# Patient Record
Sex: Female | Born: 1935 | Race: White | Hispanic: No | Smoking: Never smoker
Health system: Southern US, Community
[De-identification: ages and names within clinical notes are randomized; demographics above are authoritative.]

## PROBLEM LIST (undated history)

## (undated) ENCOUNTER — Emergency Department: Admission: EM | Payer: Medicare Other | Source: Home / Self Care

## (undated) DIAGNOSIS — E785 Hyperlipidemia, unspecified: Secondary | ICD-10-CM

## (undated) DIAGNOSIS — H3530 Unspecified macular degeneration: Secondary | ICD-10-CM

## (undated) DIAGNOSIS — H9190 Unspecified hearing loss, unspecified ear: Secondary | ICD-10-CM

## (undated) HISTORY — PX: JOINT REPLACEMENT: SHX530

---

## 1974-04-18 HISTORY — PX: DILATION AND CURETTAGE, DIAGNOSTIC / THERAPEUTIC: SUR384

## 2004-01-27 ENCOUNTER — Ambulatory Visit: Payer: Self-pay

## 2004-08-30 ENCOUNTER — Emergency Department: Payer: Self-pay | Admitting: Internal Medicine

## 2004-08-30 ENCOUNTER — Other Ambulatory Visit: Payer: Self-pay

## 2007-12-05 ENCOUNTER — Ambulatory Visit: Payer: Self-pay

## 2012-10-18 ENCOUNTER — Ambulatory Visit: Payer: Self-pay

## 2012-12-07 LAB — COMPREHENSIVE METABOLIC PANEL
Alkaline Phosphatase: 461 U/L — ABNORMAL HIGH (ref 50–136)
Bilirubin,Total: 5.3 mg/dL — ABNORMAL HIGH (ref 0.2–1.0)
Chloride: 96 mmol/L — ABNORMAL LOW (ref 98–107)
Creatinine: 0.83 mg/dL (ref 0.60–1.30)
EGFR (African American): >60
EGFR (Non-African Amer.): >60
Osmolality: 262 (ref 275–301)
Potassium: 3.9 mmol/L (ref 3.5–5.1)
SGOT(AST): 148 U/L — ABNORMAL HIGH (ref 15–37)
Sodium: 130 mmol/L — ABNORMAL LOW (ref 136–145)
Total Protein: 7.2 g/dL (ref 6.4–8.2)

## 2012-12-07 LAB — CBC
HCT: 44.7 % (ref 35.0–47.0)
HGB: 15.2 g/dL (ref 12.0–16.0)
MCH: 30.2 pg (ref 26.0–34.0)
MCHC: 34 g/dL (ref 32.0–36.0)
RBC: 5.02 10*6/uL (ref 3.80–5.20)
RDW: 13.1 % (ref 11.5–14.5)

## 2012-12-07 LAB — CK TOTAL AND CKMB (NOT AT ARMC)
CK, Total: 49 U/L (ref 21–215)
CK-MB: 1.7 ng/mL (ref 0.5–3.6)

## 2012-12-08 ENCOUNTER — Inpatient Hospital Stay: Payer: Self-pay | Admitting: Internal Medicine

## 2012-12-08 LAB — HEPATIC FUNCTION PANEL A (ARMC)
Albumin: 2.7 g/dL — ABNORMAL LOW (ref 3.4–5.0)
Alkaline Phosphatase: 405 U/L — ABNORMAL HIGH (ref 50–136)
Bilirubin, Direct: 2.9 mg/dL — ABNORMAL HIGH (ref 0.00–0.20)
Bilirubin,Total: 3.7 mg/dL — ABNORMAL HIGH (ref 0.2–1.0)
SGOT(AST): 99 U/L — ABNORMAL HIGH (ref 15–37)
Total Protein: 6.6 g/dL (ref 6.4–8.2)

## 2012-12-08 LAB — BILIRUBIN, DIRECT: Bilirubin, Direct: 4.4 mg/dL — ABNORMAL HIGH (ref 0.00–0.20)

## 2012-12-08 LAB — CBC WITH DIFFERENTIAL/PLATELET
Basophil #: 0 10*3/uL (ref 0.0–0.1)
Basophil %: 0.3 %
Eosinophil #: 0.1 10*3/uL (ref 0.0–0.7)
Eosinophil %: 0.5 %
HCT: 40.5 % (ref 35.0–47.0)
Lymphocyte #: 0.6 10*3/uL — ABNORMAL LOW (ref 1.0–3.6)
Lymphocyte %: 4.2 %
MCH: 30.3 pg (ref 26.0–34.0)
MCV: 89 fL (ref 80–100)
Monocyte %: 6.1 %
Neutrophil #: 12.1 10*3/uL — ABNORMAL HIGH (ref 1.4–6.5)
Neutrophil %: 88.9 %
Platelet: 176 10*3/uL (ref 150–440)

## 2012-12-09 LAB — CBC WITH DIFFERENTIAL/PLATELET
Basophil #: 0 10*3/uL (ref 0.0–0.1)
Eosinophil #: 0.2 10*3/uL (ref 0.0–0.7)
Eosinophil %: 2.5 %
HCT: 40.2 % (ref 35.0–47.0)
Lymphocyte #: 1 10*3/uL (ref 1.0–3.6)
Lymphocyte %: 10.6 %
MCH: 30.4 pg (ref 26.0–34.0)
MCV: 90 fL (ref 80–100)
Monocyte #: 0.8 x10 3/mm (ref 0.2–0.9)
Monocyte %: 8.4 %
Neutrophil %: 78.3 %
RBC: 4.49 10*6/uL (ref 3.80–5.20)

## 2012-12-09 LAB — COMPREHENSIVE METABOLIC PANEL
BUN: 11 mg/dL (ref 7–18)
Bilirubin,Total: 3.1 mg/dL — ABNORMAL HIGH (ref 0.2–1.0)
Chloride: 99 mmol/L (ref 98–107)
Co2: 28 mmol/L (ref 21–32)
EGFR (African American): >60
EGFR (Non-African Amer.): 56 — ABNORMAL LOW
Glucose: 111 mg/dL — ABNORMAL HIGH (ref 65–99)
Osmolality: 266 (ref 275–301)
Potassium: 4 mmol/L (ref 3.5–5.1)
SGOT(AST): 87 U/L — ABNORMAL HIGH (ref 15–37)
Sodium: 133 mmol/L — ABNORMAL LOW (ref 136–145)
Total Protein: 5.8 g/dL — ABNORMAL LOW (ref 6.4–8.2)

## 2012-12-09 LAB — LIPID PANEL
Cholesterol: 102 mg/dL (ref 0–200)
Triglycerides: 91 mg/dL (ref 0–200)

## 2012-12-09 LAB — MAGNESIUM: Magnesium: 1.9 mg/dL

## 2012-12-10 DIAGNOSIS — R9431 Abnormal electrocardiogram [ECG] [EKG]: Secondary | ICD-10-CM

## 2012-12-10 LAB — HEPATIC FUNCTION PANEL A (ARMC)
Albumin: 2.5 g/dL — ABNORMAL LOW (ref 3.4–5.0)
Alkaline Phosphatase: 435 U/L — ABNORMAL HIGH (ref 50–136)
Bilirubin, Direct: 1.1 mg/dL — ABNORMAL HIGH (ref 0.00–0.20)
SGOT(AST): 81 U/L — ABNORMAL HIGH (ref 15–37)
Total Protein: 6.1 g/dL — ABNORMAL LOW (ref 6.4–8.2)

## 2012-12-10 LAB — WBC: WBC: 6.2 10*3/uL (ref 3.6–11.0)

## 2012-12-11 LAB — COMPREHENSIVE METABOLIC PANEL
Albumin: 2.2 g/dL — ABNORMAL LOW (ref 3.4–5.0)
Alkaline Phosphatase: 354 U/L — ABNORMAL HIGH (ref 50–136)
Anion Gap: 5 — ABNORMAL LOW (ref 7–16)
BUN: 8 mg/dL (ref 7–18)
Bilirubin,Total: 1.3 mg/dL — ABNORMAL HIGH (ref 0.2–1.0)
Chloride: 105 mmol/L (ref 98–107)
Creatinine: 0.79 mg/dL (ref 0.60–1.30)
Glucose: 124 mg/dL — ABNORMAL HIGH (ref 65–99)
Osmolality: 272 (ref 275–301)
SGPT (ALT): 76 U/L (ref 12–78)
Total Protein: 5.7 g/dL — ABNORMAL LOW (ref 6.4–8.2)

## 2012-12-11 LAB — CBC WITH DIFFERENTIAL/PLATELET
Basophil #: 0 10*3/uL (ref 0.0–0.1)
Basophil %: 0.5 %
Eosinophil #: 0.2 10*3/uL (ref 0.0–0.7)
HCT: 37.6 % (ref 35.0–47.0)
HGB: 13.1 g/dL (ref 12.0–16.0)
Lymphocyte %: 14.4 %
MCHC: 34.9 g/dL (ref 32.0–36.0)
MCV: 88 fL (ref 80–100)
Monocyte #: 0.8 x10 3/mm (ref 0.2–0.9)
Neutrophil #: 5.6 10*3/uL (ref 1.4–6.5)
Platelet: 180 10*3/uL (ref 150–440)
RBC: 4.27 10*6/uL (ref 3.80–5.20)
WBC: 7.8 10*3/uL (ref 3.6–11.0)

## 2012-12-11 LAB — LIPASE, BLOOD: Lipase: 762 U/L — ABNORMAL HIGH (ref 73–393)

## 2012-12-12 LAB — COMPREHENSIVE METABOLIC PANEL
Albumin: 2.5 g/dL — ABNORMAL LOW (ref 3.4–5.0)
Alkaline Phosphatase: 328 U/L — ABNORMAL HIGH (ref 50–136)
Anion Gap: 6 — ABNORMAL LOW (ref 7–16)
BUN: 7 mg/dL (ref 7–18)
Bilirubin,Total: 1.6 mg/dL — ABNORMAL HIGH (ref 0.2–1.0)
Calcium, Total: 8.4 mg/dL — ABNORMAL LOW (ref 8.5–10.1)
Co2: 26 mmol/L (ref 21–32)
Creatinine: 0.7 mg/dL (ref 0.60–1.30)
EGFR (African American): >60
EGFR (Non-African Amer.): >60
Osmolality: 266 (ref 275–301)
Potassium: 3.8 mmol/L (ref 3.5–5.1)
SGOT(AST): 67 U/L — ABNORMAL HIGH (ref 15–37)
SGPT (ALT): 81 U/L — ABNORMAL HIGH (ref 12–78)
Sodium: 133 mmol/L — ABNORMAL LOW (ref 136–145)
Total Protein: 6.3 g/dL — ABNORMAL LOW (ref 6.4–8.2)

## 2012-12-13 LAB — HEPATIC FUNCTION PANEL A (ARMC)
Albumin: 2.5 g/dL — ABNORMAL LOW (ref 3.4–5.0)
Alkaline Phosphatase: 269 U/L — ABNORMAL HIGH (ref 50–136)
Bilirubin, Direct: 0.6 mg/dL — ABNORMAL HIGH (ref 0.00–0.20)
Bilirubin,Total: 1.3 mg/dL — ABNORMAL HIGH (ref 0.2–1.0)
SGPT (ALT): 66 U/L (ref 12–78)
Total Protein: 6.1 g/dL — ABNORMAL LOW (ref 6.4–8.2)

## 2012-12-13 LAB — LIPASE, BLOOD: Lipase: 768 U/L — ABNORMAL HIGH (ref 73–393)

## 2012-12-13 LAB — SODIUM: Sodium: 136 mmol/L (ref 136–145)

## 2012-12-14 LAB — HEPATIC FUNCTION PANEL A (ARMC)
Albumin: 2.8 g/dL — ABNORMAL LOW (ref 3.4–5.0)
Bilirubin, Direct: 0.6 mg/dL — ABNORMAL HIGH (ref 0.00–0.20)
SGPT (ALT): 69 U/L (ref 12–78)
Total Protein: 6.5 g/dL (ref 6.4–8.2)

## 2012-12-14 LAB — LIPASE, BLOOD
Lipase: 1644 U/L — ABNORMAL HIGH (ref 73–393)
Lipase: 1884 U/L — ABNORMAL HIGH (ref 73–393)

## 2012-12-15 LAB — LIPASE, BLOOD: Lipase: 1870 U/L — ABNORMAL HIGH (ref 73–393)

## 2012-12-15 LAB — CREATININE, SERUM: EGFR (African American): >60

## 2012-12-16 LAB — BASIC METABOLIC PANEL
Anion Gap: 10 (ref 7–16)
Calcium, Total: 8.2 mg/dL — ABNORMAL LOW (ref 8.5–10.1)
EGFR (African American): >60
Glucose: 62 mg/dL — ABNORMAL LOW (ref 65–99)
Sodium: 138 mmol/L (ref 136–145)

## 2012-12-16 LAB — PLATELET COUNT: Platelet: 212 10*3/uL (ref 150–440)

## 2012-12-16 LAB — LIPASE, BLOOD: Lipase: 599 U/L — ABNORMAL HIGH (ref 73–393)

## 2012-12-16 LAB — HEMOGLOBIN: HGB: 12.4 g/dL (ref 12.0–16.0)

## 2012-12-17 HISTORY — PX: CHOLECYSTECTOMY: SHX55

## 2012-12-17 LAB — LIPASE, BLOOD: Lipase: 552 U/L — ABNORMAL HIGH (ref 73–393)

## 2012-12-18 LAB — HEPATIC FUNCTION PANEL A (ARMC)
Bilirubin, Direct: 0.3 mg/dL — ABNORMAL HIGH (ref 0.00–0.20)
Bilirubin,Total: 0.8 mg/dL (ref 0.2–1.0)
SGOT(AST): 38 U/L — ABNORMAL HIGH (ref 15–37)
SGPT (ALT): 40 U/L (ref 12–78)

## 2012-12-19 LAB — HEPATIC FUNCTION PANEL A (ARMC)
Albumin: 2.3 g/dL — ABNORMAL LOW (ref 3.4–5.0)
Alkaline Phosphatase: 133 U/L (ref 50–136)
Bilirubin, Direct: 0.3 mg/dL — ABNORMAL HIGH (ref 0.00–0.20)
Bilirubin,Total: 0.7 mg/dL (ref 0.2–1.0)
SGPT (ALT): 32 U/L (ref 12–78)
Total Protein: 5.4 g/dL — ABNORMAL LOW (ref 6.4–8.2)

## 2012-12-20 LAB — COMPREHENSIVE METABOLIC PANEL
Albumin: 2.2 g/dL — ABNORMAL LOW (ref 3.4–5.0)
Anion Gap: 2 — ABNORMAL LOW (ref 7–16)
BUN: 6 mg/dL — ABNORMAL LOW (ref 7–18)
Chloride: 107 mmol/L (ref 98–107)
Co2: 31 mmol/L (ref 21–32)
Creatinine: 0.77 mg/dL (ref 0.60–1.30)
EGFR (African American): >60
EGFR (Non-African Amer.): >60
Glucose: 111 mg/dL — ABNORMAL HIGH (ref 65–99)
Osmolality: 278 (ref 275–301)
SGOT(AST): 24 U/L (ref 15–37)
Sodium: 140 mmol/L (ref 136–145)
Total Protein: 5.3 g/dL — ABNORMAL LOW (ref 6.4–8.2)

## 2012-12-20 LAB — LIPASE, BLOOD: Lipase: 1038 U/L — ABNORMAL HIGH (ref 73–393)

## 2012-12-21 LAB — CBC WITH DIFFERENTIAL/PLATELET
Basophil #: 0.1 10*3/uL (ref 0.0–0.1)
Eosinophil %: 3 %
HGB: 12.9 g/dL (ref 12.0–16.0)
MCH: 30.8 pg (ref 26.0–34.0)
MCHC: 34.6 g/dL (ref 32.0–36.0)
Monocyte #: 0.6 x10 3/mm (ref 0.2–0.9)
Platelet: 226 10*3/uL (ref 150–440)
RDW: 13.8 % (ref 11.5–14.5)
WBC: 6 10*3/uL (ref 3.6–11.0)

## 2012-12-21 LAB — LIPASE, BLOOD: Lipase: 654 U/L — ABNORMAL HIGH (ref 73–393)

## 2012-12-22 LAB — CBC WITH DIFFERENTIAL/PLATELET
Eosinophil #: 0 10*3/uL (ref 0.0–0.7)
Eosinophil %: 0 %
HCT: 34.5 % — ABNORMAL LOW (ref 35.0–47.0)
HGB: 11.6 g/dL — ABNORMAL LOW (ref 12.0–16.0)
Lymphocyte #: 0.3 10*3/uL — ABNORMAL LOW (ref 1.0–3.6)
Lymphocyte %: 1.2 %
MCH: 30.3 pg (ref 26.0–34.0)
Monocyte #: 1 x10 3/mm — ABNORMAL HIGH (ref 0.2–0.9)
Monocyte %: 4.1 %
Platelet: 242 10*3/uL (ref 150–440)
RBC: 3.82 10*6/uL (ref 3.80–5.20)
RDW: 13.5 % (ref 11.5–14.5)
WBC: 25.6 10*3/uL — ABNORMAL HIGH (ref 3.6–11.0)

## 2012-12-22 LAB — COMPREHENSIVE METABOLIC PANEL
Alkaline Phosphatase: 96 U/L (ref 50–136)
Anion Gap: 7 (ref 7–16)
BUN: 6 mg/dL — ABNORMAL LOW (ref 7–18)
Bilirubin,Total: 0.5 mg/dL (ref 0.2–1.0)
Chloride: 103 mmol/L (ref 98–107)
Co2: 26 mmol/L (ref 21–32)
Creatinine: 0.84 mg/dL (ref 0.60–1.30)
EGFR (Non-African Amer.): >60
Potassium: 3.5 mmol/L (ref 3.5–5.1)
SGOT(AST): 46 U/L — ABNORMAL HIGH (ref 15–37)
SGPT (ALT): 29 U/L (ref 12–78)
Sodium: 136 mmol/L (ref 136–145)
Total Protein: 5.1 g/dL — ABNORMAL LOW (ref 6.4–8.2)

## 2012-12-23 LAB — CBC WITH DIFFERENTIAL/PLATELET
Basophil %: 0.3 %
Eosinophil %: 0.2 %
Lymphocyte #: 0.9 10*3/uL — ABNORMAL LOW (ref 1.0–3.6)
Lymphocyte %: 5 %
Neutrophil #: 15.4 10*3/uL — ABNORMAL HIGH (ref 1.4–6.5)
Neutrophil %: 87.3 %
RDW: 14 % (ref 11.5–14.5)
WBC: 17.7 10*3/uL — ABNORMAL HIGH (ref 3.6–11.0)

## 2012-12-23 LAB — COMPREHENSIVE METABOLIC PANEL
Albumin: 2.2 g/dL — ABNORMAL LOW (ref 3.4–5.0)
BUN: 8 mg/dL (ref 7–18)
Calcium, Total: 8.2 mg/dL — ABNORMAL LOW (ref 8.5–10.1)
EGFR (African American): >60
EGFR (Non-African Amer.): >60
Glucose: 114 mg/dL — ABNORMAL HIGH (ref 65–99)
Potassium: 3.9 mmol/L (ref 3.5–5.1)
SGPT (ALT): 24 U/L (ref 12–78)
Sodium: 135 mmol/L — ABNORMAL LOW (ref 136–145)

## 2012-12-24 LAB — CREATININE, SERUM
Creatinine: 0.87 mg/dL (ref 0.60–1.30)
EGFR (African American): >60
EGFR (Non-African Amer.): 58 — ABNORMAL LOW

## 2012-12-24 LAB — CBC WITH DIFFERENTIAL/PLATELET
Basophil #: 0.1 10*3/uL (ref 0.0–0.1)
HCT: 30.5 % — ABNORMAL LOW (ref 35.0–47.0)
Lymphocyte #: 0.9 10*3/uL — ABNORMAL LOW (ref 1.0–3.6)
MCH: 30.8 pg (ref 26.0–34.0)
MCHC: 33.9 g/dL (ref 32.0–36.0)
Monocyte #: 0.8 x10 3/mm (ref 0.2–0.9)
Monocyte %: 7.3 %
Neutrophil %: 81.8 %
RBC: 3.36 10*6/uL — ABNORMAL LOW (ref 3.80–5.20)
WBC: 10.5 10*3/uL (ref 3.6–11.0)

## 2012-12-24 LAB — BASIC METABOLIC PANEL
Anion Gap: 5 — ABNORMAL LOW (ref 7–16)
Chloride: 104 mmol/L (ref 98–107)
Co2: 26 mmol/L (ref 21–32)
Glucose: 85 mg/dL (ref 65–99)
Osmolality: 269 (ref 275–301)
Potassium: 4.6 mmol/L (ref 3.5–5.1)
Sodium: 135 mmol/L — ABNORMAL LOW (ref 136–145)

## 2012-12-25 LAB — PATHOLOGY REPORT

## 2012-12-26 LAB — CBC WITH DIFFERENTIAL/PLATELET
Basophil #: 0.1 10*3/uL (ref 0.0–0.1)
Basophil %: 0.6 %
Eosinophil %: 2.6 %
HCT: 31.7 % — ABNORMAL LOW (ref 35.0–47.0)
Lymphocyte %: 8.4 %
MCH: 30.5 pg (ref 26.0–34.0)
MCV: 90 fL (ref 80–100)
Monocyte #: 1.1 x10 3/mm — ABNORMAL HIGH (ref 0.2–0.9)
Neutrophil #: 7.8 10*3/uL — ABNORMAL HIGH (ref 1.4–6.5)
Neutrophil %: 77.5 %
RBC: 3.52 10*6/uL — ABNORMAL LOW (ref 3.80–5.20)
RDW: 13.6 % (ref 11.5–14.5)
WBC: 10 10*3/uL (ref 3.6–11.0)

## 2012-12-26 LAB — BASIC METABOLIC PANEL
Anion Gap: 8 (ref 7–16)
Chloride: 99 mmol/L (ref 98–107)
Co2: 28 mmol/L (ref 21–32)
Creatinine: 0.74 mg/dL (ref 0.60–1.30)
EGFR (Non-African Amer.): >60
Osmolality: 267 (ref 275–301)
Sodium: 135 mmol/L — ABNORMAL LOW (ref 136–145)

## 2012-12-26 LAB — HEPATIC FUNCTION PANEL A (ARMC)
SGOT(AST): 45 U/L — ABNORMAL HIGH (ref 15–37)
SGPT (ALT): 18 U/L (ref 12–78)

## 2012-12-27 LAB — HEPATIC FUNCTION PANEL A (ARMC)
Albumin: 1.8 g/dL — ABNORMAL LOW (ref 3.4–5.0)
Bilirubin,Total: 0.6 mg/dL (ref 0.2–1.0)
SGOT(AST): 34 U/L (ref 15–37)
SGPT (ALT): 16 U/L (ref 12–78)
Total Protein: 5 g/dL — ABNORMAL LOW (ref 6.4–8.2)

## 2012-12-27 LAB — CBC WITH DIFFERENTIAL/PLATELET
Basophil #: 0.1 10*3/uL (ref 0.0–0.1)
Basophil %: 0.9 %
Eosinophil #: 0.3 10*3/uL (ref 0.0–0.7)
Eosinophil %: 4.5 %
HGB: 10.2 g/dL — ABNORMAL LOW (ref 12.0–16.0)
Lymphocyte #: 0.6 10*3/uL — ABNORMAL LOW (ref 1.0–3.6)
Lymphocyte %: 9.6 %
MCH: 30.8 pg (ref 26.0–34.0)
MCHC: 34.3 g/dL (ref 32.0–36.0)
MCV: 90 fL (ref 80–100)
Monocyte #: 0.9 x10 3/mm (ref 0.2–0.9)
Neutrophil #: 4.8 10*3/uL (ref 1.4–6.5)
Neutrophil %: 71.6 %
Platelet: 227 10*3/uL (ref 150–440)
WBC: 6.7 10*3/uL (ref 3.6–11.0)

## 2012-12-27 LAB — BASIC METABOLIC PANEL
Anion Gap: 8 (ref 7–16)
Co2: 29 mmol/L (ref 21–32)
Creatinine: 0.67 mg/dL (ref 0.60–1.30)
EGFR (African American): >60
EGFR (Non-African Amer.): >60
Sodium: 137 mmol/L (ref 136–145)

## 2012-12-29 ENCOUNTER — Encounter: Payer: Self-pay | Admitting: Internal Medicine

## 2013-01-16 ENCOUNTER — Encounter: Payer: Self-pay | Admitting: Internal Medicine

## 2014-08-08 NOTE — Consult Note (Signed)
Chief Complaint:  Subjective/Chief Complaint seen for choledocholithiasis/jaundice.  still with ruq pain, less than yesterday, positive nausea, no emesis. afebrile   VITAL SIGNS/ANCILLARY NOTES: **Vital Signs.:   24-Aug-14 14:01  Vital Signs Type Routine  Temperature Temperature (F) 98.4  Celsius 36.8  Temperature Source oral  Pulse Pulse 81  Respirations Respirations 20  Systolic BP Systolic BP 138  Diastolic BP (mmHg) Diastolic BP (mmHg) 68  Mean BP 91  Pulse Ox % Pulse Ox % 94  Pulse Ox Activity Level  At rest  Oxygen Delivery Room Air/ 21 %   Brief Assessment:  Cardiac Regular   Respiratory clear BS   Gastrointestinal details normal Soft  Nondistended  Bowel sounds normal  No rebound tenderness  tender to palpation in the epigastrum and right upper quadrant.   Lab Results: Hepatic:  22-Aug-14 22:11   Bilirubin, Total  5.3  Alkaline Phosphatase  461  SGPT (ALT)  183  SGOT (AST)  148  23-Aug-14 17:13   Bilirubin, Total  3.7  Alkaline Phosphatase  405  SGPT (ALT)  135  SGOT (AST)  99  Bilirubin, Direct  2.90 (Result(s) reported on 08 Dec 2012 at 06:06PM.)    22:11   Bilirubin, Direct  4.4 (Result(s) reported on 08 Dec 2012 at 10:38AM.)  24-Aug-14 04:29   Bilirubin, Total  3.1  Alkaline Phosphatase  399  SGPT (ALT)  112  SGOT (AST)  87  Total Protein, Serum  5.8  Albumin, Serum  2.5  Routine Chem:  24-Aug-14 04:29   Glucose, Serum  111  BUN 11  Creatinine (comp) 0.89  Sodium, Serum  133  Chloride, Serum 99  CO2, Serum 28  Calcium (Total), Serum  8.2  Osmolality (calc) 266  eGFR (African American) >60  eGFR (Non-African American)  56 (eGFR values <60mL/min/1.73 m2 may be an indication of chronic kidney disease (CKD). Calculated eGFR is useful in patients with stable renal function. The eGFR calculation will not be reliable in acutely ill patients when serum creatinine is changing rapidly. It is not useful in  patients on dialysis. The eGFR calculation  may not be applicable to patients at the low and high extremes of body sizes, pregnant women, and vegetarians.)  Anion Gap  6  Magnesium, Serum 1.9 (1.8-2.4 THERAPEUTIC RANGE: 4-7 mg/dL TOXIC: > 10 mg/dL  -----------------------)  Cholesterol, Serum 102  Triglycerides, Serum 91  HDL (INHOUSE)  7  VLDL Cholesterol Calculated 18  LDL Cholesterol Calculated 77 (Result(s) reported on 09 Dec 2012 at 05:30AM.)  Routine Hem:  22-Aug-14 22:11   WBC (CBC)  18.2  23-Aug-14 17:13   WBC (CBC)  13.6  24-Aug-14 04:29   WBC (CBC) 9.0  RBC (CBC) 4.49  Hemoglobin (CBC) 13.7  Hematocrit (CBC) 40.2  Platelet Count (CBC) 170  MCV 90  MCH 30.4  MCHC 34.0  RDW 13.4  Neutrophil % 78.3  Lymphocyte % 10.6  Monocyte % 8.4  Eosinophil % 2.5  Basophil % 0.2  Neutrophil #  7.0  Lymphocyte # 1.0  Monocyte # 0.8  Eosinophil # 0.2  Basophil # 0.0 (Result(s) reported on 09 Dec 2012 at 05:08AM.)   Radiology Results: US:    23-Aug-14 02:50, US Abdomen Limited Survey  US Abdomen Limited Survey   REASON FOR EXAM:    RUQ pain  COMMENTS:   Body Site: GB and Fossa, CBD, Head of Pancreas    PROCEDURE: US  - US ABDOMEN LIMITED SURVEY  - Dec 08 2012  2:50AM       RESULT: The study is very limited due to the patient's physical kyphosis.    The gallbladder exhibits distention with stones. The gallbladder wall is   thickened at 4.6 mm. A small amount of free fluid is noted adjacent to   the gallbladder. A sonographic Murphy's sign could not be assessed due to   the patient's medicated status.    There is dilation of the common bile duct at 8.5 mm and it contains   multiple echogenic foci consistent with stones. The common bile duct   measures as much as 13 mm in the pancreatic head.  The pancreas is poorly evaluated due to bowel gas and the patient's   clinical position due to kyphosis. Evaluation of the liver is quite   limited. Portal venous flow is normal in direction toward the  liver.    IMPRESSION:    1. The gallbladder is mildly distended and contains multiple stones.   There is gallbladder wall thickening and a trace of pericholecystic   fluid. These findings could reflect acute cholecystitis.  2. There are common bile duct stones and there is dilation of the common   bile duct between 8.5 and 13 mm.  3. Evaluation the pancreas and liver was limited.    Followup CT scanning may be of value.    A preliminary report was sent to the emergency department at the     conclusion of the study.     Dictation Site: 1        Verified By: DAVID A. Martinique, M.D., MD   Assessment/Plan:  Assessment/Plan:  Assessment 1) cholelithiasis, cholecystitis, choledocholithiasis-wbc improved, less pain on abx.   Plan 1) will arrange for ercp tomorrow pm, recommend ccy thereafter.  will discuss with Dr Candace Cruise tomorrow.   Electronic Signatures: Loistine Simas (MD)  (Signed 24-Aug-14 15:37)  Authored: Chief Complaint, VITAL SIGNS/ANCILLARY NOTES, Brief Assessment, Lab Results, Radiology Results, Assessment/Plan   Last Updated: 24-Aug-14 15:37 by Loistine Simas (MD)

## 2014-08-08 NOTE — Consult Note (Signed)
Brief Consult Note: Diagnosis: cholecystitis, choledocholithiasis, cholelithiasis.   Patient was seen by consultant.   Recommend to proceed with surgery or procedure.   Comments: Patient seen and examined, full consult dictated [phone]#[id number].  Patient 81 yo f admitted with obstructive jaundice in the setting of choledocholithiasis, cyolecystitis, and cholelithiasis.  Currently appears stable and afebrile, but with positive murphies sign.  On iv abx.  Will repeat labs now and again in am.  Low threshold for transfer for ercp if needed sooner than monday, as ercp not available here until monday.  Electronic Signatures: Barnetta ChapelSkulskie, Martin (MD)  (Signed 23-Aug-14 16:59)  Authored: Brief Consult Note   Last Updated: 23-Aug-14 16:59 by Barnetta ChapelSkulskie, Martin (MD)

## 2014-08-08 NOTE — Discharge Summary (Signed)
PATIENT NAME:  Yvonne Edwards, Yvonne Edwards MR#:  [id number] DATE OF BIRTH:  01/06/1919  FINAL DIAGNOSES:  1.  Cholelithiasis.  2.  Choledocholithiasis with obstruction.  3.  Postoperative endoscopic retrograde cholangiopancreatography related pancreatitis.   PRINCIPLE PROCEDURES:  1.  ERCP with stone extraction.  2.  HIDA scan.  3.  Open cholecystectomy with choledochoduodenostomy on December 21, 2012   CONSULTATIONS: With GI medicine, internal medicine, case management, physical therapy.   HOSPITAL COURSE SUMMARY: The patient was admitted to the medical service with abdominal pain. She was noted to have elevated liver function tests and a dilated bile duct on ultrasonography. She was taken for an ERCP. Stone extraction with stent was performed but difficult. Sphincterotomy or stent placement could not be performed.   Postoperatively the patient had post-ERCP pancreatitis and abdominal pain which was mild. Despite being n.p.o. for several days, her abdominal pain continued after eating. CT scan demonstrated extensive stone disease within the gallbladder with possibility of acute cholecystitis, at which point surgical services became re-involved and a HIDA scan was performed, demonstrating nonvisualization of the gallbladder. There was prompt filling of the duodenum.   She was taken to the operating room on September 6, at which point an open cholecystectomy with choledochoduodenostomy was performed with extraction of multiple stones.   Postoperatively she did well. She had minimal pain. This was controlled well with a PCA. Nasogastric tube was left in place a total of two and half days and was removed. There was never any bile within her JP which was removed on postoperative day #6 along with her staples.   Her diet was able to be advanced and she tolerated this well without any nausea or vomiting.   Case management did become involved because the patient does live by herself and she was deemed a [facility]  [facility].   DISPOSITION: Discharge will be performed on September 12 to the rehabilitation facility.   DISCHARGE MEDICATIONS:  1.  Crestor 20 mg by mouth daily.  2.  Zofran ODT 4 mg by mouth as needed 3 times a day as needed for nausea.  3.  Omeprazole 10 mg by mouth delayed release 1 tablet  daily.  4.  Colace 100 mg by mouth b.i.d.  5.  Tramadol 50 mg by mouth every 6 hours as needed 1 tab.   DISCHARGE INSTRUCTIONS: Call with any questions or concerns. [facility]    ____________________________ Redge GainerMark A. Egbert GaribaldiBird, MD mab:np D: 12/28/2012 11:53:31 ET T: 12/28/2012 17:55:46 ET JOB#: [id number]  cc: Loraine LericheMark A. Egbert GaribaldiBird, MD, <Dictator> Marcine Matarharles W. Phillips Jr., MD Ryleigh Buenger Kela MillinA Taiven Greenley MD ELECTRONICALLY SIGNED 12/29/2012 14:15

## 2014-08-08 NOTE — Op Note (Signed)
PATIENT NAME:  Yvonne Edwards, Yvonne Edwards MR#:  [id number] DATE OF BIRTH:  [birth year]  DATE OF PROCEDURE:  12/21/2012  PREOPERATIVE DIAGNOSIS: 1.  Cholelithiasis. 2.  Choledocholithiasis. 3.  Biliary pancreatitis.   POSTOPERATIVE DIAGNOSIS: 1.  Cholelithiasis. 2.  Choledocholithiasis. 3.  Biliary pancreatitis.   PROCEDURES PERFORMED:  1.  Attempted laparoscopic cholecystectomy with conversion to open cholecystectomy. 2.  Open common bile duct exploration. 3.  Choledochoduodenostomy.   SURGEON: Yvonne Edwards, M.D.   ASSISTANT: Dr. Duwaine Maxin.  TYPE OF ANESTHESIA: General endotracheal.   INDICATIONS: This 81 year old white female was admitted on the 23rd of August with 3 days of right upper quadrant abdominal pain and obvious jaundice. An ERCP was subsequently performed on 12/10/2012, at which point 1 stone was extracted, however, a sphincterotomy could not be performed due to duodenal anatomy and duodenal diverticulum. The patient subsequently had post ERCP pancreatitis. Her lipase really never normalized. She continued to have intermittent abdominal pain over the course of the next several days. Her lipase, having never normalized, a CT scan was obtained which demonstrated findings suggestive of possible acute cholecystitis with no evidence of peripancreatic edema. There was no evidence of biliary ductal dilatation seen on the CT scan. Subsequently, a HIDA scan was performed. the morning of surgery which demonstrated nonvisualization of the gallbladder but prompt emptying of tracer into the duodenum. Given her previous ERCP findings and the fact that she may have a common bile duct stone, I discussed with her and her family the possibility of needing an open common bile duct exploration and possible T-tube placement. All of her questions were answered.   FINDINGS: During the laparoscopic portion of the operation a 2 mm cautery injury was noted on the anterior aspect of the midportion of the common  bile duct. Because of this, I converted to an open procedure. I also contacted Dr. Anda Edwards for his expert assistance.   DESCRIPTION OF PROCEDURE: With informed consent, supine position, general oral endotracheal anesthesia, the patient's left arm was tucked and padded at her side. Her abdomen was widely prepped and draped with ChloraPrep solution and a time out was observed. A 12 mm Hassan trocar was placed through an open technique through an infraumbilical transverse oriented skin incision with stay sutures being passed through the fascia. Pneumoperitoneum was established. A 5 mm Bladeless trocar was placed in the epigastric region and two 5 mm ports were placed in the right subcostal margin. Mature adhesions of the omentum were taken down off the gallbladder body with traction and hot cautery. Sharp dissection was also utilized. It should be noted that the right lobe of the liver was very superior in this patient resulting in a very anteriorly located gallbladder. The gallbladder was grasped along its fundus and elevated towards the right shoulder. Lateral traction was achieved on Hartman pouch. The portal structures appeared to be very anterior in this patient. Careful dissection along the peritoneal reflection of the gallbladder with hook cautery was achieved medially but was difficult to achieve laterally. During this process, I noticed a small amount of bile staining. Closer inspection demonstrated the above findings and as such, open procedure was then performed.   At this point I contacted Dr. Anda Edwards and discussed operative findings with the family present.  Decision was made to proceed.  Trocars were removed under direct visualization. A right upper quadrant transversely oriented Coker incision was fashioned. The rectus muscle was divided with electrocautery. A self-retaining abdominal wall retractor was placed. The right lobe of the  liver was delivered into the wound by packing posteriorly with 3  wet laparotomy tapes. Nasogastric tube was passed by anesthesia and confirmed by palpation within the antrum of the stomach. The gallbladder was taken down with careful dissection in a dome down fashion. Hemostasis being obtained with direct pressure and cautery in the gallbladder fossa as well as Surgicel.   The cystic duct was tortuous and rather dilated. It had spiraled itself and actually was attached anteriorly to the common bile duct resulting in to joining of the two for some distance.   The cystic artery was identified and doubly clipped on the portal side, singly clipped on the gallbladder side and divided with Metzenbaum scissors. The cystic duct was critically identified at this point. It was ligated with a #0 silk suture and the specimen was amputated. A generous Coker maneuver was then performed. The 2 mm injury to the common bile duct was several millimeters below the junction of the cystic duct. In fact, the bifurcation of the right and left hepatic ducts to the common bile duct was below the hilar plate.   At this point I asked Dr. Anda KraftMarterre for his assistance.  A #11 blade was used to create a longitudinal choledochotomy. This was extended below the area of the injury. The edges of the injury were debrided sharply to fresh bleeding tissue. Stay sutures of 5-0 PDS were placed on either side.   Biliary scoops were then placed through the choledochotomy and approximately 15 to 20 small stones were extracted. I could not pass Blake dilators down into the duodenum through the sphincter of Oddi. The smallest Blake dilator available was a number 4.  The size of the duct with a #11 Blake.   Because of this, a choledochoduodenostomy was chosen for reconstruction. The duodenum was opened along its longitudinal access for a very a short distance with electrocautery. An anastomosis between the choledochus and the duodenum was then fashioned with multiple interrupted 5-0 double-armed PDS suture on RB1  needles.  On the medial aspect we had no further 5-0 PDS RB1 sutures and as such, a 4-0 PDS suture was utilized which was single armed.  All knots were on the outside of the duct. The duct was widely patent and the anastomosis appeared to be intact.  A 19 mm JP drain was then placed exiting laterally and directed into Morison pouch. The right upper quadrant was copiously irrigated. Hemostasis was ensured on the operative field. Laparotomy tapes were accounted for with LAP and needle count correct x 2. The infraumbilical fascial defect was reapproximated with an additional figure-of-eight #0 Vicryl suture in vertical orientation and the previously placed stay sutures in the fascia were tied to each other. A two layer closure utilizing #0 PDS was utilized for fascial closure. Subcutaneous tissues were irrigated. A skin stapler was used to reapproximate skin edges. The patient was subsequently extubated and taken to the recovery room in stable and satisfactory condition by anesthesia service    ____________________________ Redge GainerMark A. Egbert GaribaldiBird, MD mab:dp D: 12/22/2012 08:38:39 ET T: 12/22/2012 09:22:36 ET JOB#: [id number]  cc: Loraine LericheMark A. Egbert GaribaldiBird, MD, <Dictator> Kimra Kantor A Iylah Dworkin MD ELECTRONICALLY SIGNED 12/25/2012 10:01

## 2014-08-08 NOTE — Consult Note (Signed)
PATIENT NAME:  Yvonne Edwards, Yvonne Edwards MR#:  [id number] DATE OF BIRTH:  Feb 04, 1919  DATE OF CONSULTATION:  12/08/2012  CONSULTING PHYSICIAN:  Christena Deem, MD  This is a patient of Dr. Amado Coe.  REASON FOR CONSULTATION: Possible ERCP, choledocholithiasis and cholelithiasis.   HISTORY OF PRESENT ILLNESS: Yvonne Edwards is a very pleasant 81 year old female who states that about 2 days ago she began to have some epigastric and upper right quadrant abdominal pain. She stated that it got much worse yesterday and she came to the hospital earlier this morning. The pain has been epigastric. It seems to radiate more so to the left side. There has been some nausea and vomiting. The last time, however, that she threw up was yesterday. Her pain currently is a 2 out of 10. She was more concerned about a fall she had in July that also gave her some abdominal discomfort. That, however, seemed to have resolved previously. She has had no previous episodes of similar pain to this. She has had some problems with recent constipation due to a pain pill that she was taking for her fall. She usually has a bowel movement about every 1 to 2 days. There has been no black stools, blood in the stools or slimy stools. She has had no symptoms that she came into the hospital with previously.   GASTROINTESTINAL FAMILY HISTORY: Negative for colorectal cancer, liver disease or ulcers. It is possible that her mother had gallbladder problems, but it has been a long time and she could not remember directly.   PAST MEDICAL HISTORY: She has a history of senile macular degeneration (dry variant), hypercholesterolemia, allergies. She has had a D and C in the past. She denies any other hospitalizations or ongoing medical treatment.   OUTPATIENT MEDICATIONS: Include Crestor 10 mg daily. She also takes an allergy pill; I believe this is likely over-the-counter like Claritin or Allegra. She also takes a proprietary eye vitamin.   ALLERGIES: SHE IS  ALLERGIC TO BIAXIN, CODEINE, TYLENOL AND SHELLFISH.   REVIEW OF SYSTEMS: Per admission history and physical, agree with same.   PHYSICAL EXAMINATION: VITAL SIGNS: Temperature is 98.1, pulse 86, respirations 20, blood pressure 102/65, pulse oximetry 94%.  GENERAL: She is a 81 year old Caucasian female in no acute distress. She is mildly jaundiced.  HEENT: Normocephalic, atraumatic. Eyes show some icterus. Nose: Septum midline. Oropharynx: No lesions.  NECK: Supple. No JVD.  HEART: Regular rate and rhythm.  LUNGS: Clear.  ABDOMEN: Soft. She is tender to palpation throughout the epigastric region extending into the left and right upper quadrants. There is a positive mild Murphy's sign to the right upper quadrant. There are no masses, rebound or organomegaly. Bowel sounds are positive.  EXTREMITIES: No clubbing, cyanosis or edema.  NEUROLOGICAL: Cranial nerves II through XII grossly intact. Muscle strength bilaterally equal and symmetric, 5/5. DTRs bilaterally equal and symmetric.   LABORATORY AND RADIOLOGICAL DATA: Include glucose of 112 (these laboratories again from last night), BUN 13, creatinine 0.83, sodium 130, potassium 3.9, chloride 96, bicarbonate 30 calcium 9.0. Hepatic profile showing a total protein of 7.2, albumin 3.3, total bilirubin 5.3. I did order a direct bilirubin; this came back as a 4.4 from those labs. Alkaline phosphatase 461, AST 148, ALT 183. Her CK total was 49; CPK-MB was 1.7, troponin I less than 0.02. Hemogram showed a white count of 18.2, hemoglobin and hematocrit of 15.2 and 44.7, platelet count of 186, MCV of 89. She had a portable chest film showing lungs  less well inflated than on previous and a persistent density in the left lung base reflecting possibly atelectasis, scarring or possibly infiltrate. Followup film was recommended. She did have an abdominal ultrasound today that showed the gallbladder mildly distended with multiple stones. There is a gallbladder wall  thickening, trace of pericholecystic fluid, likely representing acute cholecystitis. There are  common bile duct stones and dilatation of the common bile duct between 8.5 to 13 mm. There was also limited evaluation of the pancreas and liver. There have been no labs repeated since last night.   ASSESSMENT: Choledocholithiasis/cholelithiasis/cholecystitis. The patient currently is afebrile. She has been placed on IV antibiotic. She does have a mild positive Murphy's sign as well as continued epigastric pain. Other symptoms in terms of nausea and vomiting have improved.   RECOMMENDATION: Agree that ERCP is a needed procedure for this patient, then to be followed by cholecystectomy. ERCP is not available until Monday, as physicians that do those procedures will not be available until Monday. The patient is currently on an antibiotic and is afebrile. Will repeat labs to re-evaluate liver lab status as well as hemogram. Should she show any evidence of decline, she will need to have this procedure more urgently. This may require her to be transferred to another institution.   ____________________________ Christena DeemMartin U. Trevyon Swor, MD mus:jm D: 12/08/2012 16:48:55 ET T: 12/08/2012 17:20:02 ET JOB#: [id number]  cc: Christena DeemMartin U. Basia Mcginty, MD, <Dictator> Christena DeemMARTIN U Quasean Frye MD ELECTRONICALLY SIGNED 12/27/2012 20:24

## 2014-08-08 NOTE — Consult Note (Signed)
Brief Consult Note: Diagnosis: choledocholithiasis.   Patient was seen by consultant.   Consult note dictated.   Recommend to proceed with surgery or procedure.   Comments: Will need GI eval for preop ERCP followed by lap chole. Rationale discussed with pt and family.  Electronic Signatures: Lattie Hawooper, Jibri Schriefer E (MD)  (Signed 23-Aug-14 12:20)  Authored: Brief Consult Note   Last Updated: 23-Aug-14 12:20 by Lattie Hawooper, Gino Garrabrant E (MD)

## 2014-08-08 NOTE — Consult Note (Signed)
Chief Complaint:  Subjective/Chief Complaint patient feeling much better today, no n/v or abdominal pain.  tolerating clears.   VITAL SIGNS/ANCILLARY NOTES: **Vital Signs.:   26-Aug-14 13:10  Vital Signs Type Routine  Temperature Temperature (F) 98.3  Celsius 36.8  Temperature Source oral  Pulse Pulse 74  Respirations Respirations 20  Systolic BP Systolic BP 798  Diastolic BP (mmHg) Diastolic BP (mmHg) 73  Mean BP 106  Pulse Ox % Pulse Ox % 95  Pulse Ox Activity Level  At rest  Oxygen Delivery Room Air/ 21 %   Brief Assessment:  Cardiac Regular   Respiratory clear BS   Gastrointestinal details normal Soft  Nontender  Nondistended  Bowel sounds normal  No rebound tenderness   Lab Results: Hepatic:  26-Aug-14 04:16   Bilirubin, Total  1.3  Alkaline Phosphatase  354  SGPT (ALT) 76  SGOT (AST)  54  Total Protein, Serum  5.7  Albumin, Serum  2.2  Routine Chem:  26-Aug-14 04:16   Hemoglobin A1c ([facility]) 6.0 (The American Diabetes Association recommends that a primary goal of therapy should be <7% and that physicians should reevaluate the treatment regimen in patients with HbA1c values consistently >8%.)  Glucose, Serum  124  BUN 8  Creatinine (comp) 0.79  Sodium, Serum 136  Potassium, Serum 3.9  Chloride, Serum 105  CO2, Serum 26  Calcium (Total), Serum 8.5  Osmolality (calc) 272  eGFR (African American) >60  eGFR (Non-African American) >60 (eGFR values <108m/min/1.73 m2 may be an indication of chronic kidney disease (CKD). Calculated eGFR is useful in patients with stable renal function. The eGFR calculation will not be reliable in acutely ill patients when serum creatinine is changing rapidly. It is not useful in  patients on dialysis. The eGFR calculation may not be applicable to patients at the low and high extremes of body sizes, pregnant women, and vegetarians.)  Anion Gap  5  Lipase  762 (Result(s) reported on 11 Dec 2012 at 05:06AM.)  Routine Hem:   26-Aug-14 04:16   WBC (CBC) 7.8  RBC (CBC) 4.27  Hemoglobin (CBC) 13.1  Hematocrit (CBC) 37.6  Platelet Count (CBC) 180  MCV 88  MCH 30.7  MCHC 34.9  RDW 13.7  Neutrophil % 72.3  Lymphocyte % 14.4  Monocyte % 10.3  Eosinophil % 2.5  Basophil % 0.5  Neutrophil # 5.6  Lymphocyte # 1.1  Monocyte # 0.8  Eosinophil # 0.2  Basophil # 0.0 (Result(s) reported on 11 Dec 2012 at 05:02AM.)   Assessment/Plan:  Assessment/Plan:  Assessment 1) cholelithiasis, choledocholithiasis s/p ercp.  possible retained cbd ston.  doing better. awaiting ccy/IOC possible cbde   Plan as above   Electronic Signatures: SLoistine Simas(MD)  (Signed 26-Aug-14 16:05)  Authored: Chief Complaint, VITAL SIGNS/ANCILLARY NOTES, Brief Assessment, Lab Results, Assessment/Plan   Last Updated: 26-Aug-14 16:05 by SLoistine Simas(MD)

## 2014-08-08 NOTE — Consult Note (Signed)
Chief Complaint:  Subjective/Chief Complaint Results of CT noted. Pancreas looks normal but GB still inflammed. Pt may be having HIDA scan tomorrow. Lipase down.   VITAL SIGNS/ANCILLARY NOTES: **Vital Signs.:   04-Sep-14 14:07  Vital Signs Type Routine  Temperature Temperature (F) 97.9  Celsius 36.6  Temperature Source oral  Pulse Pulse 76  Respirations Respirations 20  Systolic BP Systolic BP 786  Diastolic BP (mmHg) Diastolic BP (mmHg) 62  Mean BP 76  Pulse Ox % Pulse Ox % 94  Pulse Ox Activity Level  At rest  Oxygen Delivery Room Air/ 21 %   Brief Assessment:  GEN no acute distress   Cardiac Regular   Respiratory clear BS   Gastrointestinal mild epig tenderness   Lab Results: Hepatic:  04-Sep-14 04:57   Bilirubin, Total 0.6  Alkaline Phosphatase 123  SGPT (ALT) 26  SGOT (AST) 24  Total Protein, Serum  5.3  Albumin, Serum  2.2  Routine Chem:  04-Sep-14 04:57   Lipase  1038 (Result(s) reported on 20 Dec 2012 at 05:31AM.)  Glucose, Serum  111  BUN  6  Creatinine (comp) 0.77  Sodium, Serum 140  Potassium, Serum  3.4  Chloride, Serum 107  CO2, Serum 31  Calcium (Total), Serum  8.0  Osmolality (calc) 278  eGFR (African American) >60  eGFR (Non-African American) >60 (eGFR values <26m/min/1.73 m2 may be an indication of chronic kidney disease (CKD). Calculated eGFR is useful in patients with stable renal function. The eGFR calculation will not be reliable in acutely ill patients when serum creatinine is changing rapidly. It is not useful in  patients on dialysis. The eGFR calculation may not be applicable to patients at the low and high extremes of body sizes, pregnant women, and vegetarians.)  Anion Gap  2   Radiology Results: CT:    04-Sep-14 10:45, CT Abdomen With Contrast  CT Abdomen With Contrast   REASON FOR EXAM:    Acute Pancreatitis, Persistantly elevated Lipase.;      NOTE: Nursing to Give Oral  COMMENTS:       PROCEDURE: CT  - CT ABDOMEN  STANDARD W  - Dec 20 2012 10:45AM     RESULT: History: Acute pancreatitis    Comparison: None    Technique: Multiple axial images of the abdomen were performed from the   lung bases to the iliac crests, with p.o. contrast and with 85 ml of   Isovue 300 intravenous contrast.    Findings:    There is a trace left pleural effusion.    The liver demonstrates no focal abnormality. There is mild intrahepatic   biliary ductal dilatation. There are cholelithiasis. There is a small   amount of pericholecystic fluid. The spleen demonstrates no focal   abnormality. The kidneys, adrenal glands, and pancreas are normal.     The visualized portions of the stomach, duodenum, small intestine, and   large intestine demonstrate no contrast extravasation or dilatation.   There is no pneumoperitoneum, pneumatosis, or portal venous gas. There is   no abdominal free fluid. There is no lymphadenopathy.     The abdominal aorta is normal in caliber with atherosclerosis.  There is lumbar spine spondylosis.    IMPRESSION:     1. Cholelithiasis with a small amount of pericholecystic fluid concerning   for cholecystitis. Surgical consultation recommended.    Dictation Site: 1        Verified By: HJennette Banker M.D., MD   Assessment/Plan:  Assessment/Plan:  Assessment Cholecystitis. Chemical pancreatitis. Normal pancreas on CT.   Plan Await surgical input about timing of surgery. If surgery to be later, then discharge patient on liquid diet. Thanks.   Electronic Signatures: Verdie Shire (MD)  (Signed 04-Sep-14 16:08)  Authored: Chief Complaint, VITAL SIGNS/ANCILLARY NOTES, Brief Assessment, Lab Results, Radiology Results, Assessment/Plan   Last Updated: 04-Sep-14 16:08 by Verdie Shire (MD)

## 2014-08-08 NOTE — Consult Note (Signed)
Chief Complaint:  Subjective/Chief Complaint Asked to see patient for persistently elevated lipase. Whenever patient is fed solids, lipase rises. Some epig discomfort which increased with low fat diet.   VITAL SIGNS/ANCILLARY NOTES: **Vital Signs.:   03-Sep-14 13:05  Vital Signs Type Routine  Temperature Temperature (F) 97.4  Celsius 36.3  Temperature Source oral  Pulse Pulse 78  Respirations Respirations 19  Systolic BP Systolic BP 136  Diastolic BP (mmHg) Diastolic BP (mmHg) 67  Mean BP 90  Pulse Ox % Pulse Ox % 95  Pulse Ox Activity Level  At rest  Oxygen Delivery Room Air/ 21 %   Brief Assessment:  GEN no acute distress   Cardiac Regular   Respiratory clear BS   Gastrointestinal Normal   Lab Results: Hepatic:  03-Sep-14 04:30   Bilirubin, Total 0.7  Bilirubin, Direct  0.3 (Result(s) reported on 19 Dec 2012 at 05:12AM.)  Alkaline Phosphatase 133  SGPT (ALT) 32  SGOT (AST) 27  Total Protein, Serum  5.4  Albumin, Serum  2.3  Routine Chem:  03-Sep-14 04:30   Lipase  1858 (Result(s) reported on 19 Dec 2012 at 05:18AM.)   Assessment/Plan:  Assessment/Plan:  Assessment CBD stones. S/P ERCP. Mild pancreatitis. Symptoms are only mild.   Plan Surgery prefers to hold off on surgery until lipase down. Would order CT of abd with contrast tomorrow to evaluate pancreas and CBD. If CT normal, patient can be discharged to home with monitering of symptoms and lipase at home. Thanks.   Electronic Signatures: Lutricia Feilh, Trayveon Beckford (MD)  (Signed 03-Sep-14 15:43)  Authored: Chief Complaint, VITAL SIGNS/ANCILLARY NOTES, Brief Assessment, Lab Results, Assessment/Plan   Last Updated: 03-Sep-14 15:43 by Lutricia Feilh, Jamilynn Whitacre (MD)

## 2014-08-08 NOTE — Consult Note (Signed)
PATIENT NAME:  Yvonne Edwards, Yvonne Edwards MR#:  [id number] DATE OF BIRTH:  1918/08/14  DATE OF CONSULTATION:  12/08/2012  CONSULTING PHYSICIAN:  Adah Salvageichard E. Excell Seltzerooper, MD  CHIEF COMPLAINT: Right upper quadrant pain.   HISTORY OF PRESENT ILLNESS: This is a patient with obvious jaundice. Family had not seen her for a while, and she was brought in for 3 days of right upper quadrant and epigastric pain and some back pain with nausea and vomiting. No fevers or chills. Has never had an episode like this before.   PAST MEDICAL HISTORY: Hyperlipidemia.   PAST SURGICAL HISTORY: None.   ALLERGIES: BIAXIN, CODEINE, TYLENOL AND SHELLFISH.   MEDICATIONS: A statin drug for hypercholesterolemia.   FAMILY HISTORY: Noncontributory.   SOCIAL HISTORY: The patient does not smoke or drink.   REVIEW OF SYSTEMS: A 10-system review has been performed and negative with the exception of that mentioned in the HPI.   PHYSICAL EXAMINATION:  GENERAL: Obviously jaundiced, but otherwise healthy female patient.  VITAL SIGNS: Stable. She is afebrile.  HEENT: Shows obvious scleral icterus  NECK: No palpable neck nodes.  CHEST: Clear to auscultation.  CARDIAC: Regular rate and rhythm.  ABDOMEN: Soft and tender in the right upper quadrant and epigastrium, with no peritoneal signs. Negative Murphy's sign.  EXTREMITIES: Without edema.  NEUROLOGIC: Grossly intact.  INTEGUMENT: Obvious jaundice.   DIAGNOSTIC STUDIES: Ultrasound shows stones with dilated bile duct and stones in the common bile duct. Sodium is 130. Bilirubin is 5.3, AST and ALT 148 and 183, with an alkaline phosphatase of 461. White blood cell count is 18.2, H and H of 15 and 45, with a platelet count of 186.   ASSESSMENT AND PLAN: This is a patient with obvious jaundice and icterus and findings of choledocholithiasis. The rationale for offering laparoscopic cholecystectomy at some point was discussed, but because of this patient's high bilirubin and multiple stones in  the common bile duct on ultrasound, she will require preoperative endoscopic retrograde cholangiopancreatography. GI has been consulted, and I will await their input and then plan on a laparoscopic cholecystectomy once her duct is cleared.   ____________________________ Adah Salvageichard E. Excell Seltzerooper, MD rec:OSi D: 12/08/2012 12:23:50 ET T: 12/08/2012 12:47:04 ET JOB#: [id number]  cc: Adah Salvageichard E. Excell Seltzerooper, MD, <Dictator> Yvonne HawICHARD E Radley Teston MD ELECTRONICALLY SIGNED 12/08/2012 16:49

## 2014-08-08 NOTE — Consult Note (Signed)
ERCP performed at the request of Dr. Ricki RodriguezSkulski. Elected to proceed with ERCP due to multiple stones seen on U/S, although LFT has dropped today. ERCP showed large periampullary tic with small ampulla at the edge. Many small pigmented stones in duodenum. Cannulation quite difficult due to anatomy and angle of ampulla. Short cannulation done. Large stone extracted. However, deep cannulation not possible. Therefore, unclear if more stones present in proximal CBD. Keep patient NPO rest of today except ice/meds. Check for pancreatitis. If neg, then can proceed with GB surgery. However, recommend IOC to see if more stones present. Thanks.  Electronic Signatures: Lutricia Feilh, Kacelyn Rowzee (MD)  (Signed on 25-Aug-14 14:53)  Authored  Last Updated: 25-Aug-14 14:53 by Lutricia Feilh, Lijah Bourque (MD)

## 2014-08-08 NOTE — H&P (Signed)
PATIENT NAME:  Yvonne Edwards, [name] MR#:  [id number] DATE OF BIRTH:  18-May-1918  DATE OF ADMISSION:  12/08/2012  PRIMARY CARE PHYSICIAN:  Dr. Loma Senderharles Phillips.   REFERRING PHYSICIAN:  Dr. York CeriseForbach.   CHIEF COMPLAINT:  Epigastric abdominal pain with nausea and vomiting.   HISTORY OF PRESENT ILLNESS:  The patient is a 81 year old Caucasian female who is pretty healthy, lives alone, takes care of herself and still drives, is presenting to the ER with a chief complaint of 2 to 3 day history of intermittent episodes of epigastric abdominal pain associated with nausea and vomiting.  Her abdominal pain was worse since yesterday which prompted her to come to the ER.  The patient's LFTs are elevated in the ER and ultrasound of the gallbladder has revealed choledocholithiasis with mild extrahepatic and minimal intrahepatic biliary ductal dilatation, recommending ERCP.  Hospitalist team is called to admit the patient.  The patient's abdominal pain is significantly improved with IV morphine.  As the patient has no significant family members, her close friends came by with her.  The patient is reporting that she fell approximately two months ago and had a hairline fracture and also two loose teeth regarding which she had a dental stent placement and following that she was taking a soft diet and for the past two days she was unable to keep anything down because of nausea and vomiting.   PAST MEDICAL HISTORY:  Hyperlipidemia.   PAST SURGICAL HISTORY:  D and C.    ALLERGIES:  She is allergic to BIAXIN, CODEINE, TYLENOL, SHELLFISH.   PSYCHOSOCIAL HISTORY:  Lives alone.  Denies any smoking, alcohol or illicit drug usage.   FAMILY HISTORY:  Her parents were healthy.    REVIEW OF SYSTEMS:  CONSTITUTIONAL:  Denies any fever, fatigue.  EYES:  Denies blurry vision, inflammation.  EARS, NOSE, THROAT:  No epistaxis or discharge.   RESPIRATION:  No cough, COPD.  CARDIOVASCULAR:  No chest pain or palpitations.   GASTROINTESTINAL:  Complaining of nausea, vomiting and epigastric abdominal pain.  GENITOURINARY:  No dysuria, hematuria. GYNECOLOGIC AND BREAST:  Denies breast mass or vaginal discharge.  ENDOCRINE:  No polyuria, nocturia.  HEMATOLOGIC AND LYMPHATIC:  No anemia, easy bruising, bleeding.  INTEGUMENTARY:  No acne, rash, lesions.  MUSCULOSKELETAL:  No joint pain.  Denies gout.  NEUROLOGIC:  No vertigo or ataxia.  PSYCHIATRIC:  No ADD, OCD.   PHYSICAL EXAMINATION: VITAL SIGNS:  Temperature 98.6, pulse 18 to 20, blood pressure initially 199/99, but eventually after giving pain medicine it was at 116/54 with pulse ox 94% on room air.  GENERAL APPEARANCE:  Not under acute distress.  Moderately built and nourished . HEENT:  Normocephalic, atraumatic.  Pupils are equal and reacting to light and accommodation.  Positive scleral icterus.  Extraocular movements are intact.  No sinus tenderness.  No postnasal drip.  Dry mucous membranes.  NECK:  Supple.  No JVD.  No thyromegaly.  No lymphadenopathy.    LUNGS:  Clear to auscultation bilaterally.  No accessory muscle usage.  No anterior chest wall tenderness on palpation.  CARDIAC:  S1, S2 normal.  Regular rate and rhythm.  No murmurs.  GASTROINTESTINAL:  Soft.  Bowel sounds are positive in all four quadrants.  Minimal epigastric tenderness is present.  No rebound tenderness.  No masses felt.  NEUROLOGIC:  Awake, alert, oriented x 3.  Motor and sensory grossly intact.  Reflexes are 2+.  EXTREMITIES:  No edema.  No cyanosis.  No clubbing.  PSYCHIATRIC:  Normal mood and affect.  SKIN:  Warm to touch.  Dry in nature.  MUSCULOSKELETAL:  No joint effusion, tenderness or erythema.   LABORATORY AND IMAGING STUDIES:  LFTs are elevated.  Total protein 7.2, albumin 3.3, bili 5.3, alkaline phosphatase 461, AST 148, ALT 183.  Cardiac enzymes were normal.  WBC 18.2, hemoglobin 15.2, hematocrit 44.7, platelets 186, glucose 112, BUN 13, creatinine 0.8, sodium 130,  potassium 3.9, chloride 96, CO2 30.  GFR greater than 60.  Anion gap 4, serum osmolality 262, calcium 9.0.  A 12-lead EKG has revealed a junctional rhythm at 88 beats per minute and nonspecific ST-T wave changes.  Ultrasound of the gallbladder has revealed choledocholithiasis with a mild extrahepatic and intrahepatic biliary ductal dilatation recommending ERCP, cholelithiasis with gallbladder wall thickening and trace pericholecystic fluid is concerning for acute cholecystitis in the appropriate clinical setting.   ASSESSMENT AND PLAN:  A 81 year old female presenting to the ER with a chief complaint of epigastric abdominal pain with nausea and vomiting, will be admitted with the following assessment and plan.  1.  Acute choledocholithiasis and cholecystitis with elevated liver function tests.  We will keep her nothing by mouth.  We will provide her IV fluids, IV Protonix.  GI consult is placed for ERCP.  Pain management with morphine as needed.  2.  Hyperlipidemia.  Holding statin and check fasting lipid panel.  3.  Epigastric pain.  Pain management with morphine.  4.  Nausea and vomiting.  We will provide her antinausea medication and IV fluids.  5.  She is FULL CODE.  Her medical power of attorney is her close friend Mr. Lillia Pauls.   Diagnosis and plan of care was discussed in detail with the patient and her close friends at bedside.  They all verbalized understanding of the plan.   Total time spent on admission is 45 minutes.     ____________________________ Ramonita Lab, MD ag:ea D: 12/08/2012 05:22:09 ET T: 12/08/2012 06:00:48 ET JOB#: [id number]  cc: Ramonita Lab, MD, <Dictator> Ramonita Lab MD ELECTRONICALLY SIGNED 12/09/2012 7:08

## 2014-08-08 NOTE — Consult Note (Signed)
Results of HIDA noted. Plans for surgery later noted as well. Will sign off. However, if GI issues arise over the weekend, then contact GI on call. Thanks.  Electronic Signatures: Lutricia Feilh, Ebonye Reade (MD)  (Signed on 05-Sep-14 13:38)  Authored  Last Updated: 05-Sep-14 13:38 by Lutricia Feilh, Esli Clements (MD)

## 2015-04-23 ENCOUNTER — Ambulatory Visit (INDEPENDENT_AMBULATORY_CARE_PROVIDER_SITE_OTHER): Payer: Medicare Other | Admitting: Podiatry

## 2015-04-23 ENCOUNTER — Ambulatory Visit (INDEPENDENT_AMBULATORY_CARE_PROVIDER_SITE_OTHER): Payer: Medicare Other

## 2015-04-23 ENCOUNTER — Encounter: Payer: Self-pay | Admitting: Podiatry

## 2015-04-23 DIAGNOSIS — M2010 Hallux valgus (acquired), unspecified foot: Secondary | ICD-10-CM | POA: Diagnosis not present

## 2015-04-23 DIAGNOSIS — M2141 Flat foot [pes planus] (acquired), right foot: Secondary | ICD-10-CM | POA: Diagnosis not present

## 2015-04-23 DIAGNOSIS — M2040 Other hammer toe(s) (acquired), unspecified foot: Secondary | ICD-10-CM | POA: Diagnosis not present

## 2015-04-23 DIAGNOSIS — M779 Enthesopathy, unspecified: Secondary | ICD-10-CM

## 2015-04-23 DIAGNOSIS — R52 Pain, unspecified: Secondary | ICD-10-CM | POA: Diagnosis not present

## 2015-04-23 DIAGNOSIS — M2142 Flat foot [pes planus] (acquired), left foot: Secondary | ICD-10-CM | POA: Diagnosis not present

## 2015-04-24 NOTE — Progress Notes (Signed)
Subjective:     Patient ID: Yvonne Edwards, female   DOB: 04/22/1918, 82 y.o.   MRN: [id number]  HPI 82 year old female presents the office today with complaints of tightness the tops of her feet and across the instep of her foot which has been ongoing for 1 year. She denies any recent injury or trauma. She also points to Achilles tendon where she gets some pain. She previously got a physical therapy for other issues and she was doing some stretching exercises to her feet which they taught her which seem to help the Achilles tendon issue. There's been no swelling or redness. No tingling or numbness. No other complaints at this time.  Review of Systems  All other systems reviewed and are negative.      Objective:   Physical Exam General: AAO x3, NAD  Dermatological: Skin is warm, dry and supple bilateral. Nails x 10 are well manicured; remaining integument appears unremarkable at this time. There are no open sores, no preulcerative lesions, no rash or signs of infection present.  Vascular: Dorsalis Pedis artery and Posterior Tibial artery pedal pulses are 2/4 bilateral with immedate capillary fill time. Pedal hair growth present. There is no pain with calf compression, swelling, warmth, erythema.   Neruologic: Grossly intact via light touch bilateral. Vibratory intact via tuning fork bilateral. Protective threshold with Semmes Wienstein monofilament intact to all pedal sites bilateral. Patellar and Achilles deep tendon reflexes 2+ bilateral. No Babinski or clonus noted bilateral.   Musculoskeletal: There is significant HAV deformity hammertoe present bilaterally with the second digit overlapping the hallux and the left side. There is a flatfoot deformity present as well. Equinus is present. There is really no tenderness along the course of the Achilles tendon there is no defect noted in [place] test is negative. There is no specific area pinpoint bony tenderness there is no pain vibratory  sensation. MMT 5/5. Gait: Unassisted, Nonantalgic.      Assessment:     82 year old female with bilateral foot pain likely due to her flat foot, hammertoe, bunion deformity    Plan:     -Treatment options discussed including all alternatives, risks, and complications -X-rays were obtained and reviewed with the patient.  -Etiology of symptoms were discussed -Discussed stretching exercises for Achilles tendinitis as well as to help with her foot. -Given her foot structure she'll likely benefit from an accommodative type orthotic to help support her foot and take pressure off of her forefoot. She would proceed with this today. She understands the cause. She was [facility] -Follow-up in 3 weeks to [facility] or sooner if any problems arise. In the meantime, encouraged to call the office with any questions, concerns, change in symptoms.   Ovid CurdMatthew Gaby Harney, DPM

## 2015-05-26 ENCOUNTER — Ambulatory Visit (INDEPENDENT_AMBULATORY_CARE_PROVIDER_SITE_OTHER): Payer: Medicare Other | Admitting: Podiatry

## 2015-05-26 ENCOUNTER — Encounter: Payer: Self-pay | Admitting: Podiatry

## 2015-05-26 DIAGNOSIS — M2010 Hallux valgus (acquired), unspecified foot: Secondary | ICD-10-CM

## 2015-05-26 DIAGNOSIS — M2040 Other hammer toe(s) (acquired), unspecified foot: Secondary | ICD-10-CM

## 2015-05-26 NOTE — Patient Instructions (Signed)

## 2015-05-29 NOTE — Progress Notes (Signed)
Patient ID: Yvonne Edwards, female   DOB: 02/12/1919, 82 y.o.   MRN: [id number]  Patient presents the office a pickup orthotics. She was seen today by Hadley Pen, CMA. Oral and written break in instructions were discussed. Follow up in 4 weeks. Call with any questions or concerns in the meantime or follow-up sooner if any issues are to arise.

## 2015-06-04 ENCOUNTER — Encounter: Payer: Self-pay | Admitting: Podiatry

## 2015-06-04 ENCOUNTER — Ambulatory Visit (INDEPENDENT_AMBULATORY_CARE_PROVIDER_SITE_OTHER): Payer: Medicare Other | Admitting: Podiatry

## 2015-06-04 DIAGNOSIS — M79676 Pain in unspecified toe(s): Secondary | ICD-10-CM | POA: Diagnosis not present

## 2015-06-04 DIAGNOSIS — B351 Tinea unguium: Secondary | ICD-10-CM | POA: Diagnosis not present

## 2015-06-04 NOTE — Progress Notes (Signed)
Patient ID: Yvonne CHARRIER, female   DOB: [birth year], 82 y.o.   MRN: [id number]  Subjective: 82 y.o. returns the office today for painful, elongated, thickened toenails which she cannot trim herself. Denies any redness or drainage around the nails. She has picked up orthotics female doing well. They have caused some pain to her toes that she believes is from the thickness of the toenails. She also can these were spacers for bunion and hammertoes which does well. Denies any pain to her feet at this time other than the toenails. Denies any acute changes since last appointment and no new complaints today. Denies any systemic complaints such as fevers, chills, nausea, vomiting.   Objective: AAO 3, NAD DP/PT pulses palpable, CRT less than 3 seconds Nails hypertrophic, dystrophic, elongated, brittle, discolored 10. There is tenderness overlying the nails 1-5 bilaterally. There is no surrounding erythema or drainage along the nail sites. HAV hammertoe deformity is present bilaterally the left side worse than the right and the second dose turned over left hallux. There is prominent metatarsal heads plantarly and atrophy of the fat pad. At this time there is no area pinpoint bony tenderness or pain the vibratory sensation bilaterally. No open lesions or pre-ulcerative lesions are identified. No other areas of tenderness bilateral lower extremities. No overlying edema, erythema, increased warmth. No pain with calf compression, swelling, warmth, erythema.  Assessment: Patient presents with symptomatic onychomycosis  Plan: -Treatment options including alternatives, risks, complications were discussed -Nails sharply debrided 10 without complication/bleeding. -Continue with orthotics and offloading pads. Discussed that if she still is having pain to the toes the orthotic may need to be modified. She'll try to see if the toenail trimming helps. -Discussed daily foot inspection. If there are any changes, to call  the office immediately.  -Follow-up in 3 months or sooner if any problems are to arise. In the meantime, encouraged to call the office with any questions, concerns, changes symptoms.  Ovid Curd, DPM

## 2015-08-27 ENCOUNTER — Encounter: Payer: Self-pay | Admitting: Podiatry

## 2015-08-27 ENCOUNTER — Ambulatory Visit (INDEPENDENT_AMBULATORY_CARE_PROVIDER_SITE_OTHER): Payer: Medicare Other | Admitting: Podiatry

## 2015-08-27 DIAGNOSIS — M79676 Pain in unspecified toe(s): Secondary | ICD-10-CM

## 2015-08-27 DIAGNOSIS — B351 Tinea unguium: Secondary | ICD-10-CM | POA: Diagnosis not present

## 2015-08-27 NOTE — Progress Notes (Signed)
Patient ID: Yvonne HawHelen R Edwards, female   DOB: [birth year], 82 y.o.   MRN: [id number]  Subjective: 82 y.o. returns the office today for painful, elongated, thickened toenails which she cannot trim herself. Denies any redness or drainage around the nails.  Denies any acute changes since last appointment and no new complaints today. Denies any systemic complaints such as fevers, chills, nausea, vomiting.   Objective: AAO 3, NAD DP/PT pulses palpable, CRT less than 3 seconds Nails hypertrophic, dystrophic, elongated, brittle, discolored 10. There is tenderness overlying the nails 1-5 bilaterally. There is no surrounding erythema or drainage along the nail sites. HAV hammertoe deformity is present bilaterally the left side worse than the right and the second dose turned over left hallux. There is prominent metatarsal heads plantarly and atrophy of the fat pad. There is no area pinpoint bony tenderness or pain the vibratory sensation bilaterally. No open lesions or pre-ulcerative lesions are identified. No other areas of tenderness bilateral lower extremities. No overlying edema, erythema, increased warmth. No pain with calf compression, swelling, warmth, erythema.  Assessment: Patient presents with symptomatic onychomycosis  Plan: -Treatment options including alternatives, risks, complications were discussed -Nails sharply debrided 10 without complication/bleeding. -Offloading pads dispensed. -Discussed daily foot inspection. If there are any changes, to call the office immediately.  -Follow-up in 3 months or sooner if any problems are to arise. In the meantime, encouraged to call the office with any questions, concerns, changes symptoms.  Ovid CurdMatthew Richmond Coldren, DPM

## 2015-12-02 DIAGNOSIS — H9193 Unspecified hearing loss, bilateral: Secondary | ICD-10-CM | POA: Insufficient documentation

## 2015-12-02 DIAGNOSIS — E7800 Pure hypercholesterolemia, unspecified: Secondary | ICD-10-CM | POA: Insufficient documentation

## 2015-12-02 DIAGNOSIS — H3530 Unspecified macular degeneration: Secondary | ICD-10-CM | POA: Insufficient documentation

## 2015-12-03 ENCOUNTER — Encounter: Payer: Self-pay | Admitting: Podiatry

## 2015-12-03 ENCOUNTER — Ambulatory Visit (INDEPENDENT_AMBULATORY_CARE_PROVIDER_SITE_OTHER): Payer: Medicare Other | Admitting: Podiatry

## 2015-12-03 DIAGNOSIS — M79676 Pain in unspecified toe(s): Secondary | ICD-10-CM

## 2015-12-03 DIAGNOSIS — R2681 Unsteadiness on feet: Secondary | ICD-10-CM

## 2015-12-03 DIAGNOSIS — B351 Tinea unguium: Secondary | ICD-10-CM

## 2015-12-03 NOTE — Progress Notes (Addendum)
Patient ID: Yvonne Edwards, female   DOB: [birth year], 82 y.o.   MRN: [id number]  Subjective: 82 y.o. returns the office today for painful, elongated, thickened toenails which she cannot trim herself. Denies any redness or drainage around the nails.  Denies any acute changes since last appointment and no new complaints today. She states that she feels unsteady on her feet. She has not fallen but does not want to. She is asking about a [employer], [employer] as she brings in the brochure. Denies any systemic complaints such as fevers, chills, nausea, vomiting.   Objective: AAO 3, NAD DP/PT pulses palpable, CRT less than 3 seconds Nails hypertrophic, dystrophic, elongated, brittle, discolored 10. There is tenderness overlying the nails 1-5 bilaterally. There is no surrounding erythema or drainage along the nail sites. HAV hammertoe deformity is present bilaterally the left side worse than the right and the second dose turned over left hallux. There is prominent metatarsal heads plantarly and atrophy of the fat pad. There is no area pinpoint bony tenderness or pain the vibratory sensation bilaterally. No open lesions or pre-ulcerative lesions are identified. No other areas of tenderness bilateral lower extremities. No overlying edema, erythema, increased warmth. No pain with calf compression, swelling, warmth, erythema.  Assessment: Patient presents with symptomatic onychomycosis; unsteady gait   Plan: -Treatment options including alternatives, risks, complications were discussed -Nails sharply debrided 10 without complication/bleeding. -Discussed MBB- she would like to proceed with this likely. Will have her fallow-up with Betha for MBB.  -Discussed daily foot inspection. If there are any changes, to call the office immediately.  -Follow-up in 3 months or sooner if any problems are to arise. In the meantime, encouraged to call the office with any questions, concerns, changes symptoms.  Ovid CurdMatthew  Wagoner, DPM

## 2015-12-23 ENCOUNTER — Ambulatory Visit (INDEPENDENT_AMBULATORY_CARE_PROVIDER_SITE_OTHER): Payer: Medicare Other | Admitting: Podiatry

## 2015-12-23 DIAGNOSIS — M2010 Hallux valgus (acquired), unspecified foot: Secondary | ICD-10-CM

## 2015-12-23 DIAGNOSIS — M79673 Pain in unspecified foot: Secondary | ICD-10-CM

## 2015-12-23 DIAGNOSIS — R2681 Unsteadiness on feet: Secondary | ICD-10-CM

## 2015-12-23 DIAGNOSIS — M2040 Other hammer toe(s) (acquired), unspecified foot: Secondary | ICD-10-CM

## 2015-12-23 DIAGNOSIS — M2141 Flat foot [pes planus] (acquired), right foot: Secondary | ICD-10-CM

## 2015-12-23 DIAGNOSIS — M2142 Flat foot [pes planus] (acquired), left foot: Secondary | ICD-10-CM

## 2015-12-29 ENCOUNTER — Telehealth: Payer: Self-pay | Admitting: Podiatry

## 2015-12-29 NOTE — Progress Notes (Signed)
Patient presents today for casting and measuring for moore balance braces and diabetic shoes. Patient is not diabetic so shoes will be ordered at a non covered service. Tammy will contact pt to make sure she is aware of this and will hold shoe until instruction from pt. Will go ahead and send out for the braces and contact the pt once they arrive.

## 2015-12-29 NOTE — Telephone Encounter (Signed)
Per Fenton FoyBetha the [employer] [employer] is covered by her insurance and the order should be placed for that. She doesn't have to be a diabetic for the brace to be covered. The shoes that she got, Fenton FoyBetha made the patient aware that the shoes would not be covered and told her that she would have to pay $140.00 for the shoes. Patient agreed to pay. Fenton FoyBetha was not aware that we needed to collect the money up front for the shoes but still wants the order to go out for them. I will call the patient and get her back in the office to pay for the shoes today.   I will enter the charge on her account for the $140 today. Fenton FoyBetha said the charge should be $140 not $190.

## 2015-12-30 NOTE — Telephone Encounter (Signed)
Will go ahead and order shoes and Tammy will collect what the pt was quoted.

## 2016-01-20 ENCOUNTER — Ambulatory Visit (INDEPENDENT_AMBULATORY_CARE_PROVIDER_SITE_OTHER): Payer: Medicare Other | Admitting: *Deleted

## 2016-01-20 DIAGNOSIS — M2142 Flat foot [pes planus] (acquired), left foot: Secondary | ICD-10-CM

## 2016-01-20 DIAGNOSIS — M2010 Hallux valgus (acquired), unspecified foot: Secondary | ICD-10-CM

## 2016-01-20 DIAGNOSIS — R2681 Unsteadiness on feet: Secondary | ICD-10-CM

## 2016-01-20 DIAGNOSIS — M2141 Flat foot [pes planus] (acquired), right foot: Secondary | ICD-10-CM

## 2016-01-22 NOTE — Progress Notes (Signed)
Betha dispensed [employer] balance braces and diabetic shoes. Will follow up as needed.

## 2016-02-10 ENCOUNTER — Encounter: Payer: Self-pay | Admitting: Podiatry

## 2016-03-04 ENCOUNTER — Encounter: Payer: Self-pay | Admitting: Podiatry

## 2016-03-04 ENCOUNTER — Ambulatory Visit (INDEPENDENT_AMBULATORY_CARE_PROVIDER_SITE_OTHER): Payer: Medicare Other | Admitting: Podiatry

## 2016-03-04 DIAGNOSIS — L608 Other nail disorders: Secondary | ICD-10-CM

## 2016-03-04 DIAGNOSIS — M79609 Pain in unspecified limb: Secondary | ICD-10-CM

## 2016-03-04 DIAGNOSIS — B351 Tinea unguium: Secondary | ICD-10-CM | POA: Diagnosis not present

## 2016-03-04 DIAGNOSIS — L603 Nail dystrophy: Secondary | ICD-10-CM

## 2016-03-20 NOTE — Progress Notes (Signed)
SUBJECTIVE Patient  presents to office today complaining of elongated, thickened nails. Pain while ambulating in shoes. Patient is unable to trim their own nails.   OBJECTIVE General Patient is awake, alert, and oriented x 3 and in no acute distress. Derm Skin is dry and supple bilateral. Negative open lesions or macerations. Remaining integument unremarkable. Nails are tender, long, thickened and dystrophic with subungual debris, consistent with onychomycosis, 1-5 bilateral. No signs of infection noted. Vasc  DP and PT pedal pulses palpable bilaterally. Temperature gradient within normal limits.  Neuro Epicritic and protective threshold sensation diminished bilaterally.  Musculoskeletal Exam No symptomatic pedal deformities noted bilateral. Muscular strength within normal limits.  ASSESSMENT 1. Onychodystrophic nails 1-5 bilateral with hyperkeratosis of nails.  2. Onychomycosis of nail due to dermatophyte bilateral 3. Pain in foot bilateral  PLAN OF CARE 1. Patient evaluated today.  2. Instructed to maintain good pedal hygiene and foot care.  3. Mechanical debridement of nails 1-5 bilaterally performed using a nail nipper. Filed with dremel without incident.  4. Return to clinic in 3 mos.    Santonio Speakman M Janene Yousuf, DPM    

## 2016-05-16 ENCOUNTER — Encounter: Payer: Self-pay | Admitting: Podiatry

## 2016-05-16 ENCOUNTER — Ambulatory Visit (INDEPENDENT_AMBULATORY_CARE_PROVIDER_SITE_OTHER): Payer: Medicare Other | Admitting: Podiatry

## 2016-05-16 DIAGNOSIS — B351 Tinea unguium: Secondary | ICD-10-CM

## 2016-05-16 DIAGNOSIS — M79609 Pain in unspecified limb: Secondary | ICD-10-CM | POA: Diagnosis not present

## 2016-05-16 NOTE — Progress Notes (Signed)
Complaint:  Visit Type: Patient returns to my office for continued preventative foot care services. Complaint: Patient states" my nails have grown long and thick and become painful to walk and wear shoes" . The patient presents for preventative foot care services. No changes to ROS  Podiatric Exam: Vascular: dorsalis pedis and posterior tibial pulses are palpable bilateral. Capillary return is immediate. Temperature gradient is WNL. Skin turgor WNL  Sensorium: Normal Semmes Weinstein monofilament test. Normal tactile sensation bilaterally. Nail Exam: Pt has thick disfigured discolored nails with subungual debris noted bilateral entire nail hallux through fifth toenails Ulcer Exam: There is no evidence of ulcer or pre-ulcerative changes or infection. Orthopedic Exam: Muscle tone and strength are WNL. No limitations in general ROM. No crepitus or effusions noted. Foot type and digits show no abnormalities. Bony prominences are unremarkable. Skin: No Porokeratosis. No infection or ulcers  Diagnosis:  Onychomycosis, , Pain in right toe, pain in left toes  Treatment & Plan Procedures and Treatment: Consent by patient was obtained for treatment procedures. The patient understood the discussion of treatment and procedures well. All questions were answered thoroughly reviewed. Debridement of mycotic and hypertrophic toenails, 1 through 5 bilateral and clearing of subungual debris. No ulceration, no infection noted.  Return Visit-Office Procedure: Patient instructed to return to the office for a follow up visit 3 months   for continued evaluation and treatment.    Tristen Luce DPM 

## 2016-08-01 ENCOUNTER — Ambulatory Visit (INDEPENDENT_AMBULATORY_CARE_PROVIDER_SITE_OTHER): Payer: Medicare Other | Admitting: Podiatry

## 2016-08-01 ENCOUNTER — Encounter: Payer: Self-pay | Admitting: Podiatry

## 2016-08-01 DIAGNOSIS — B351 Tinea unguium: Secondary | ICD-10-CM | POA: Diagnosis not present

## 2016-08-01 DIAGNOSIS — M2142 Flat foot [pes planus] (acquired), left foot: Secondary | ICD-10-CM

## 2016-08-01 DIAGNOSIS — M2041 Other hammer toe(s) (acquired), right foot: Secondary | ICD-10-CM

## 2016-08-01 DIAGNOSIS — M2010 Hallux valgus (acquired), unspecified foot: Secondary | ICD-10-CM

## 2016-08-01 DIAGNOSIS — M2042 Other hammer toe(s) (acquired), left foot: Secondary | ICD-10-CM

## 2016-08-01 DIAGNOSIS — M2141 Flat foot [pes planus] (acquired), right foot: Secondary | ICD-10-CM

## 2016-08-01 DIAGNOSIS — M79609 Pain in unspecified limb: Secondary | ICD-10-CM | POA: Diagnosis not present

## 2016-08-01 NOTE — Progress Notes (Signed)
Complaint:  Visit Type: Patient returns to my office for continued preventative foot care services. Complaint: Patient states" my nails have grown long and thick and become painful to walk and wear shoes"  The patient presents for preventative foot care services. No changes to ROS  Podiatric Exam: Vascular: dorsalis pedis and posterior tibial pulses are palpable bilateral. Capillary return is immediate. Temperature gradient is WNL. Skin turgor WNL  Sensorium: Normal Semmes Weinstein monofilament test. Normal tactile sensation bilaterally. Nail Exam: Pt has thick disfigured discolored nails with subungual debris noted bilateral entire nail hallux through fifth toenails Ulcer Exam: There is no evidence of ulcer or pre-ulcerative changes or infection. Orthopedic Exam: Muscle tone and strength are WNL. No limitations in general ROM. No crepitus or effusions noted. Pes planus.  HAV with overlapping 2  B/L.  Hammer toes  B/L Skin: No Porokeratosis. No infection or ulcers  Diagnosis:  Onychomycosis, , Pain in right toe, pain in left toes  Treatment & Plan Procedures and Treatment: Consent by patient was obtained for treatment procedures. The patient understood the discussion of treatment and procedures well. All questions were answered thoroughly reviewed. Debridement of mycotic and hypertrophic toenails, 1 through 5 bilateral and clearing of subungual debris. No ulceration, no infection noted.  Return Visit-Office Procedure: Patient instructed to return to the office for a follow up visit 3 months for continued evaluation and treatment.    Helane Gunther DPM

## 2016-09-29 ENCOUNTER — Ambulatory Visit: Payer: Medicare Other | Admitting: Podiatry

## 2016-10-17 ENCOUNTER — Ambulatory Visit (INDEPENDENT_AMBULATORY_CARE_PROVIDER_SITE_OTHER): Payer: Medicare Other | Admitting: Podiatry

## 2016-10-17 DIAGNOSIS — M2041 Other hammer toe(s) (acquired), right foot: Secondary | ICD-10-CM

## 2016-10-17 DIAGNOSIS — M79609 Pain in unspecified limb: Secondary | ICD-10-CM | POA: Diagnosis not present

## 2016-10-17 DIAGNOSIS — M2042 Other hammer toe(s) (acquired), left foot: Secondary | ICD-10-CM

## 2016-10-17 DIAGNOSIS — B351 Tinea unguium: Secondary | ICD-10-CM | POA: Diagnosis not present

## 2016-10-17 DIAGNOSIS — M2010 Hallux valgus (acquired), unspecified foot: Secondary | ICD-10-CM

## 2016-10-17 NOTE — Progress Notes (Signed)
Complaint:  Visit Type: Patient returns to my office for continued preventative foot care services. Complaint: Patient states" my nails have grown long and thick and become painful to walk and wear shoes"  The patient presents for preventative foot care services. No changes to ROS  Podiatric Exam: Vascular: dorsalis pedis and posterior tibial pulses are palpable bilateral. Capillary return is immediate. Temperature gradient is WNL. Skin turgor WNL  Sensorium: Normal Semmes Weinstein monofilament test. Normal tactile sensation bilaterally. Nail Exam: Pt has thick disfigured discolored nails with subungual debris noted bilateral entire nail hallux through fifth toenails Ulcer Exam: There is no evidence of ulcer or pre-ulcerative changes or infection. Orthopedic Exam: Muscle tone and strength are WNL. No limitations in general ROM. No crepitus or effusions noted. Pes planus.  HAV with overlapping 2  B/L.  Hammer toes  B/L Skin: No Porokeratosis. No infection or ulcers  Diagnosis:  Onychomycosis, , Pain in right toe, pain in left toes  Treatment & Plan Procedures and Treatment: Consent by patient was obtained for treatment procedures. The patient understood the discussion of treatment and procedures well. All questions were answered thoroughly reviewed. Debridement of mycotic and hypertrophic toenails, 1 through 5 bilateral and clearing of subungual debris. No ulceration, no infection noted.  Her bilateral forefoot numbness is due to the severity of her second toe hammer toes. Return Visit-Office Procedure: Patient instructed to return to the office for a follow up visit 3 months for continued evaluation and treatment.    Helane GuntherGregory Anmarie Fukushima DPM

## 2016-12-04 ENCOUNTER — Encounter: Payer: Self-pay | Admitting: Intensive Care

## 2016-12-04 ENCOUNTER — Inpatient Hospital Stay
Admission: EM | Admit: 2016-12-04 | Discharge: 2016-12-07 | DRG: 470 | Disposition: A | Discharge status: Skilled Nursing Facility | Payer: Medicare Other | Attending: Internal Medicine | Admitting: Internal Medicine

## 2016-12-04 ENCOUNTER — Emergency Department: Payer: Medicare Other

## 2016-12-04 DIAGNOSIS — Z885 Allergy status to narcotic agent status: Secondary | ICD-10-CM

## 2016-12-04 DIAGNOSIS — S72001A Fracture of unspecified part of neck of right femur, initial encounter for closed fracture: Principal | ICD-10-CM | POA: Diagnosis present

## 2016-12-04 DIAGNOSIS — Z22322 Carrier or suspected carrier of Methicillin resistant Staphylococcus aureus: Secondary | ICD-10-CM | POA: Diagnosis not present

## 2016-12-04 DIAGNOSIS — Z96649 Presence of unspecified artificial hip joint: Secondary | ICD-10-CM

## 2016-12-04 DIAGNOSIS — W010XXA Fall on same level from slipping, tripping and stumbling without subsequent striking against object, initial encounter: Secondary | ICD-10-CM | POA: Diagnosis present

## 2016-12-04 DIAGNOSIS — D696 Thrombocytopenia, unspecified: Secondary | ICD-10-CM

## 2016-12-04 DIAGNOSIS — G8918 Other acute postprocedural pain: Secondary | ICD-10-CM

## 2016-12-04 DIAGNOSIS — T148XXA Other injury of unspecified body region, initial encounter: Secondary | ICD-10-CM

## 2016-12-04 DIAGNOSIS — Z91013 Allergy to seafood: Secondary | ICD-10-CM

## 2016-12-04 DIAGNOSIS — D72829 Elevated white blood cell count, unspecified: Secondary | ICD-10-CM

## 2016-12-04 DIAGNOSIS — E785 Hyperlipidemia, unspecified: Secondary | ICD-10-CM | POA: Diagnosis present

## 2016-12-04 DIAGNOSIS — M25551 Pain in right hip: Secondary | ICD-10-CM | POA: Diagnosis present

## 2016-12-04 DIAGNOSIS — R739 Hyperglycemia, unspecified: Secondary | ICD-10-CM | POA: Diagnosis present

## 2016-12-04 DIAGNOSIS — Z9049 Acquired absence of other specified parts of digestive tract: Secondary | ICD-10-CM | POA: Diagnosis not present

## 2016-12-04 DIAGNOSIS — E871 Hypo-osmolality and hyponatremia: Secondary | ICD-10-CM | POA: Diagnosis present

## 2016-12-04 DIAGNOSIS — E559 Vitamin D deficiency, unspecified: Secondary | ICD-10-CM | POA: Diagnosis present

## 2016-12-04 DIAGNOSIS — K5900 Constipation, unspecified: Secondary | ICD-10-CM | POA: Diagnosis present

## 2016-12-04 DIAGNOSIS — S72009A Fracture of unspecified part of neck of unspecified femur, initial encounter for closed fracture: Secondary | ICD-10-CM | POA: Diagnosis present

## 2016-12-04 HISTORY — DX: Hyperlipidemia, unspecified: E78.5

## 2016-12-04 LAB — CBC WITH DIFFERENTIAL/PLATELET
BASOS PCT: 1 %
Basophils Absolute: 0.1 10*3/uL (ref 0–0.1)
EOS ABS: 0.3 10*3/uL (ref 0–0.7)
Eosinophils Relative: 5 %
HCT: 42.3 % (ref 35.0–47.0)
HEMOGLOBIN: 14.1 g/dL (ref 12.0–16.0)
Lymphocytes Relative: 18 %
Lymphs Abs: 1.1 10*3/uL (ref 1.0–3.6)
MCH: 29.7 pg (ref 26.0–34.0)
MCHC: 33.3 g/dL (ref 32.0–36.0)
MCV: 89.1 fL (ref 80.0–100.0)
MONO ABS: 0.5 10*3/uL (ref 0.2–0.9)
MONOS PCT: 7 %
NEUTROS PCT: 69 %
Neutro Abs: 4.3 10*3/uL (ref 1.4–6.5)
Platelets: 154 10*3/uL (ref 150–440)
RBC: 4.74 MIL/uL (ref 3.80–5.20)
RDW: 12.8 % (ref 11.5–14.5)
WBC: 6.2 10*3/uL (ref 3.6–11.0)

## 2016-12-04 LAB — PROTIME-INR
INR: 0.97
Prothrombin Time: 12.9 seconds (ref 11.4–15.2)

## 2016-12-04 LAB — BASIC METABOLIC PANEL
ANION GAP: 7 (ref 5–15)
BUN: 19 mg/dL (ref 6–20)
CALCIUM: 8.8 mg/dL — AB (ref 8.9–10.3)
CO2: 27 mmol/L (ref 22–32)
CREATININE: 0.87 mg/dL (ref 0.44–1.00)
Chloride: 104 mmol/L (ref 101–111)
GFR, EST NON AFRICAN AMERICAN: 54 mL/min — AB (ref >60)
Glucose, Bld: 128 mg/dL — ABNORMAL HIGH (ref 65–99)
POTASSIUM: 4.5 mmol/L (ref 3.5–5.1)
Sodium: 138 mmol/L (ref 135–145)

## 2016-12-04 LAB — APTT: APTT: 28 s (ref 24–36)

## 2016-12-04 LAB — URINALYSIS, COMPLETE (UACMP) WITH MICROSCOPIC
Bacteria, UA: NONE SEEN
Bilirubin Urine: NEGATIVE
GLUCOSE, UA: NEGATIVE mg/dL
HGB URINE DIPSTICK: NEGATIVE
KETONES UR: NEGATIVE mg/dL
LEUKOCYTES UA: NEGATIVE
Nitrite: NEGATIVE
PROTEIN: NEGATIVE mg/dL
Specific Gravity, Urine: 1.009 (ref 1.005–1.030)
Squamous Epithelial / LPF: NONE SEEN
pH: 7 (ref 5.0–8.0)

## 2016-12-04 LAB — TYPE AND SCREEN
ABO/RH(D): A POS
Antibody Screen: NEGATIVE

## 2016-12-04 MED ORDER — SODIUM CHLORIDE 0.9 % IV SOLN
INTRAVENOUS | Status: DC
  Administered 2016-12-04 – 2016-12-06 (×3): via INTRAVENOUS

## 2016-12-04 MED ORDER — MORPHINE SULFATE (PF) 2 MG/ML IV SOLN
2.0000 mg | Freq: Once | INTRAVENOUS | Status: AC
  Administered 2016-12-04: 2 mg via INTRAVENOUS
  Filled 2016-12-04: qty 1

## 2016-12-04 MED ORDER — ROSUVASTATIN CALCIUM 10 MG PO TABS
10.0000 mg | ORAL_TABLET | Freq: Every day | ORAL | Status: DC
  Administered 2016-12-05 – 2016-12-07 (×3): 10 mg via ORAL
  Filled 2016-12-04 (×3): qty 1

## 2016-12-04 MED ORDER — MORPHINE SULFATE (PF) 2 MG/ML IV SOLN
2.0000 mg | INTRAVENOUS | Status: DC | PRN
  Administered 2016-12-04 – 2016-12-05 (×3): 2 mg via INTRAVENOUS
  Filled 2016-12-04 (×3): qty 1

## 2016-12-04 MED ORDER — MORPHINE SULFATE (PF) 4 MG/ML IV SOLN
4.0000 mg | Freq: Once | INTRAVENOUS | Status: AC
  Administered 2016-12-04: 4 mg via INTRAVENOUS
  Filled 2016-12-04: qty 1

## 2016-12-04 MED ORDER — ENOXAPARIN SODIUM 40 MG/0.4ML ~~LOC~~ SOLN: 40.0000 mg | SUBCUTANEOUS | Status: DC

## 2016-12-04 MED ORDER — ACETAMINOPHEN 325 MG PO TABS
650.0000 mg | ORAL_TABLET | Freq: Four times a day (QID) | ORAL | Status: DC | PRN
  Administered 2016-12-07: 650 mg via ORAL
  Filled 2016-12-04: qty 2

## 2016-12-04 MED ORDER — MORPHINE BOLUS VIA INFUSION: 2.0000 mg | INTRAVENOUS | Status: DC | PRN

## 2016-12-04 MED ORDER — OCUVITE-LUTEIN PO CAPS
ORAL_CAPSULE | Freq: Every day | ORAL | Status: DC
  Administered 2016-12-06 – 2016-12-07 (×2): 1 via ORAL
  Filled 2016-12-04 (×4): qty 1

## 2016-12-04 MED ORDER — CEFAZOLIN SODIUM-DEXTROSE 1-4 GM/50ML-% IV SOLN
1.0000 g | Freq: Once | INTRAVENOUS | Status: AC
  Administered 2016-12-05: 1 g via INTRAVENOUS
  Filled 2016-12-04: qty 50

## 2016-12-04 MED ORDER — ACETAMINOPHEN 650 MG RE SUPP: 650.0000 mg | Freq: Four times a day (QID) | RECTAL | Status: DC | PRN

## 2016-12-04 MED ORDER — ONDANSETRON HCL 4 MG/2ML IJ SOLN
4.0000 mg | Freq: Once | INTRAMUSCULAR | Status: AC
  Administered 2016-12-04: 4 mg via INTRAVENOUS
  Filled 2016-12-04: qty 2

## 2016-12-04 NOTE — ED Provider Notes (Signed)
[facility] Emergency Department Provider Note   ____________________________________________    I have reviewed the triage vital signs and the nursing notes.   HISTORY  Chief Complaint Fall     HPI Yvonne Edwards is a 83 y.o. female who presents with complaints of right hip pain. Patient reports she was looking out the window and turned around and got her foot caught in a carpet and fell onto her right hip. She denies head injury. She complains only of right hip pain worse with movement. Still she reports the pain is not that bad but when she moves it she reports it becomes severe and sharp in nature. No back pain. No neck pain no abdominal pain, no chest pain, no shortness of breath. She is not on blood thinners. She has not taken anything for this   History reviewed. No pertinent past medical history.  There are no active problems to display for this patient.   Past Surgical History:  Procedure Laterality Date  . CHOLECYSTECTOMY      Prior to Admission medications   Medication Sig Start Date End Date Taking? Authorizing Provider  FLUZONE HIGH-DOSE 0.5 ML SUSY TO BE ADMINISTERED BY PHARMACIST FOR IMMUNIZATION 01/21/15   [provider]  Homeopathic Products (ALLERGY MEDICINE PO) Take by mouth.    [provider]  Multiple Vitamins-Minerals (EYE VITAMINS & MINERALS PO) Take by mouth.    [provider]  Multiple Vitamins-Minerals (PRESERVISION AREDS 2 PO) Take by mouth.    [provider]  naproxen sodium (ANAPROX) 220 MG tablet Take by mouth.    [provider]  rosuvastatin (CRESTOR) 10 MG tablet Take 10 mg by mouth daily. 04/06/15   [provider]     Allergies Codeine and Shellfish allergy  History reviewed. No pertinent family history.  Social History Social History  Substance Use Topics  . Smoking status: Never Smoker  . Smokeless tobacco: Never Used  . Alcohol use No     Review of Systems  Constitutional: No Dizziness Eyes: No visual changes.  ENT: No neck pain Cardiovascular: Denies chest pain. Respiratory: Denies shortness of breath. Gastrointestinal: No abdominal pain.  No nausea, no vomiting.   Genitourinary: Negative for dysuria. Musculoskeletal: Hip pain as above Skin: Negative for rash. Neurological: Negative for headaches or weakness   ____________________________________________   PHYSICAL EXAM:  VITAL SIGNS: ED Triage Vitals  Enc Vitals Group     BP 12/04/16 1443 (!) 174/98     Pulse --      Resp 12/04/16 1443 18     Temp 12/04/16 1443 97.7 F (36.5 C)     Temp Source 12/04/16 1443 Oral     SpO2 12/04/16 1443 (!) 89 %     Weight 12/04/16 1436 49 kg (108 lb)     Height 12/04/16 1436 1.6 m (5\' 3" )     Head Circumference --      Peak Flow --      Pain Score 12/04/16 1435 10     Pain Loc --      Pain Edu? --      Excl. in GC? --     Constitutional: Alert and oriented.  Pleasant and interactive Eyes: Conjunctivae are normal.  Head: Atraumatic. Nose: No congestion/rhinnorhea. Mouth/Throat: Mucous membranes are moist.   Neck:  Painless ROM, No vertebral tenderness to palpation Cardiovascular: Normal rate, regular rhythm.   Good peripheral circulation. Respiratory: Normal respiratory effort.  No retractions. Gastrointestinal: Soft and  nontender. No distention.  No CVA tenderness. Genitourinary: deferred Musculoskeletal: Tenderness to palpation over the right lateral hip, painful range of motion, patient has the knee flexed and drawn up towards her body, pain with any attempt to straighten the leg. No bony abnormality is fel, no skin tenting t. Warm and well perfused. No vertebral tenderness to palpation Neurologic:  Normal speech and language. No gross focal neurologic deficits are appreciated.  Skin:  Skin is warm, dry and intact. No rash noted. Psychiatric: Mood and affect are normal. Speech and behavior are  normal.  ____________________________________________   LABS (all labs ordered are listed, but only abnormal results are displayed)  Labs Reviewed  BASIC METABOLIC PANEL - Abnormal; Notable for the following:       Result Value   Glucose, Bld 128 (*)    Calcium 8.8 (*)    GFR calc non Af Amer 54 (*)    All other components within normal limits  CBC WITH DIFFERENTIAL/PLATELET  PROTIME-INR  APTT  TYPE AND SCREEN  TYPE AND SCREEN   ____________________________________________  EKG  ED ECG REPORT I, Jene Every, the attending physician, personally viewed and interpreted this ECG.  Date: 12/04/2016  Rate: 89 Rhythm: normal sinus rhythm QRS Axis: normal Intervals: normal ST/T Wave abnormalities: normal Narrative Interpretation: unremarkable  ____________________________________________  RADIOLOGY  Right femoral neck fracture Chest x-ray unremarkable ____________________________________________   PROCEDURES  Procedure(s) performed: No    Critical Care performed: No ____________________________________________   INITIAL IMPRESSION / ASSESSMENT AND PLAN / ED COURSE  Pertinent labs & imaging results that were available during my care of the patient were reviewed by me and considered in my medical decision making (see chart for details).  Patient presented after a mechanical fall, complains of isolated right hip pain. No other injuries apparent on exam. We will give morphine and Zofran IV prior to x-ray.  ----------------------------------------- 3:39 PM on 12/04/2016 -----------------------------------------  Positive right femoral neck fracture, discussed with Dr. Rosita Kea orthopedic surgery, will admit to medicine service. Patient reports her pain is well-controlled at this time. Last PO intake 9:30 am    ____________________________________________   FINAL CLINICAL IMPRESSION(S) / ED DIAGNOSES  Final diagnoses:  Closed right hip fracture, initial  encounter (HCC)      NEW MEDICATIONS STARTED DURING THIS VISIT:  New Prescriptions   No medications on file     Note:  This document was prepared using Dragon voice recognition software and may include unintentional dictation errors.    Jene Every, MD 12/04/16 [phone]

## 2016-12-04 NOTE — ED Notes (Signed)
POA at bedside.

## 2016-12-04 NOTE — H&P (Signed)
Yvonne Edwards is an 83 y.o. female.   Chief Complaint: fall HPI: this is a 83 year old female. Earlier today she was in her bedroom and tripped over a carpet and fell. Hip x-ray in the ER shows a right hip fracture. The patient is a very healthy 83 year old. She rises stationary bike every day. She's had no recent chest pain or shortness of breath or any other cardiac symptoms. She has no history of coronary artery disease. Hospitalist services were contacted for admission.  History reviewed. No pertinent past medical history.  Past Surgical History:  Procedure Laterality Date  . CHOLECYSTECTOMY      History reviewed. No pertinent family history. Social History:  reports that she has never smoked. She has never used smokeless tobacco. She reports that she does not drink alcohol. Her drug history is not on file.  Allergies:  Allergies  Allergen Reactions  . Codeine Other (See Comments)  . Shellfish Allergy Nausea And Vomiting     (Not in a hospital admission)  Results for orders placed or performed during the hospital encounter of 12/04/16 (from the past 48 hour(s))  Basic metabolic panel     Status: Abnormal   Collection Time: 12/04/16  3:07 PM  Result Value Ref Range   Sodium 138 135 - 145 mmol/L   Potassium 4.5 3.5 - 5.1 mmol/L   Chloride 104 101 - 111 mmol/L   CO2 27 22 - 32 mmol/L   Glucose, Bld 128 (H) 65 - 99 mg/dL   BUN 19 6 - 20 mg/dL   Creatinine, Ser 0.87 0.44 - 1.00 mg/dL   Calcium 8.8 (L) 8.9 - 10.3 mg/dL   GFR calc non Af Amer 54 (L) >60 mL/min   GFR calc Af Amer >60 >60 mL/min    Comment: (NOTE) The eGFR has been calculated using the CKD EPI equation. This calculation has not been validated in all clinical situations. eGFR's persistently <60 mL/min signify possible Chronic Kidney Disease.    Anion gap 7 5 - 15  CBC WITH DIFFERENTIAL     Status: None   Collection Time: 12/04/16  3:07 PM  Result Value Ref Range   WBC 6.2 3.6 - 11.0 K/uL   RBC 4.74 3.80 -  5.20 MIL/uL   Hemoglobin 14.1 12.0 - 16.0 g/dL   HCT 42.3 35.0 - 47.0 %   MCV 89.1 80.0 - 100.0 fL   MCH 29.7 26.0 - 34.0 pg   MCHC 33.3 32.0 - 36.0 g/dL   RDW 12.8 11.5 - 14.5 %   Platelets 154 150 - 440 K/uL   Neutrophils Relative % 69 %   Neutro Abs 4.3 1.4 - 6.5 K/uL   Lymphocytes Relative 18 %   Lymphs Abs 1.1 1.0 - 3.6 K/uL   Monocytes Relative 7 %   Monocytes Absolute 0.5 0.2 - 0.9 K/uL   Eosinophils Relative 5 %   Eosinophils Absolute 0.3 0 - 0.7 K/uL   Basophils Relative 1 %   Basophils Absolute 0.1 0 - 0.1 K/uL  Type and screen [facility]     Status: None (Preliminary result)   Collection Time: 12/04/16  3:08 PM  Result Value Ref Range   ABO/RH(D) PENDING    Antibody Screen PENDING    Sample Expiration 12/07/2016   Type and screen     Status: None (Preliminary result)   Collection Time: 12/04/16  3:40 PM  Result Value Ref Range   ABO/RH(D) PENDING    Antibody Screen PENDING  Sample Expiration 12/07/2016   Protime-INR     Status: None   Collection Time: 12/04/16  3:40 PM  Result Value Ref Range   Prothrombin Time 12.9 11.4 - 15.2 seconds   INR 0.97   APTT     Status: None   Collection Time: 12/04/16  3:40 PM  Result Value Ref Range   aPTT 28 24 - 36 seconds   Dg Chest 1 View  Result Date: 12/04/2016 CLINICAL DATA:  Pain after fall. EXAM: CHEST 1 VIEW COMPARISON:  December 07, 2012 FINDINGS: Given the supine positioning, the cardiomediastinal silhouette is unchanged. No pneumothorax. No pulmonary nodules or masses. No focal infiltrates. IMPRESSION: No active disease. Electronically Signed   By: Dorise Bullion III M.D   On: 12/04/2016 15:35   Dg Hip Unilat With Pelvis 2-3 Views Right  Result Date: 12/04/2016 CLINICAL DATA:  Pain after fall. EXAM: DG HIP (WITH OR WITHOUT PELVIS) 2-3V RIGHT COMPARISON:  None. FINDINGS: There is a fracture through the right femoral neck with mild displacement. No evidence of femoral head dislocation. No other  fractures are identified. IMPRESSION: Fracture through the right femoral neck. Electronically Signed   By: Dorise Bullion III M.D   On: 12/04/2016 15:34    Review of Systems  Constitutional: Negative for fever.  HENT: Negative for hearing loss.   Eyes: Negative for blurred vision.  Respiratory: Negative for cough.   Cardiovascular: Negative for chest pain.  Gastrointestinal: Negative for nausea and vomiting.  Genitourinary: Negative for dysuria.  Musculoskeletal: Negative for myalgias.  Skin: Negative for rash.  Neurological: Negative for dizziness.    Blood pressure (!) 174/98, temperature 97.7 F (36.5 C), temperature source Oral, resp. rate 18, height _0  (1.6 m), weight 49 kg (108 lb), SpO2 (!) 89 %. Physical Exam  Constitutional: She is oriented to person, place, and time. She appears well-developed and well-nourished. No distress.  HENT:  Head: Normocephalic and atraumatic.  Mouth/Throat: Oropharynx is clear and moist. No oropharyngeal exudate.  Eyes: Pupils are equal, round, and reactive to light. No scleral icterus.  Neck: Neck supple. No tracheal deviation present. No thyromegaly present.  Cardiovascular: Normal rate and regular rhythm.   No murmur heard. Respiratory: Effort normal and breath sounds normal. No respiratory distress. She exhibits no tenderness.  GI: Soft. Bowel sounds are normal. She exhibits no mass.  Musculoskeletal: She exhibits no edema or tenderness.  Lymphadenopathy:    She has no cervical adenopathy.  Neurological: She is alert and oriented to person, place, and time. No cranial nerve deficit.  Skin: Skin is warm and dry.     Assessment/Plan 1. Hip fracture. Orthopedics has been consulted for potential surgical repair. Patient is at some risk because of age alone. However she is probably the most healthy 83 year old I've ever seen. No cardiac history or recent symptoms. Her labs and EKG are all normal. Would recommend she proceed to surgery for  any type of repair needed. 2. Hyperlipidemia. We'll continue her statin. 3. CODE STATUS. Discussed with patient and her healthcare power of attorney's who are present. He also said she has an advanced directive at home that addresses this. And they believe it says no prolonged life giving measures. However the cat remember if it addressed DO NOT RESUSCITATE status or not. Currently one of them is going to retrieve this from her home and bring him into the nurses station. For now she is a full code but this may change after we are able to view the  document. Next a.m. Total time spent 40 minutes  Baxter Hire, MD 12/04/2016, 4:31 PM

## 2016-12-04 NOTE — ED Notes (Signed)
Patient placed on 2L o2 with sats of 95%

## 2016-12-04 NOTE — Consult Note (Signed)
Patient is a 83 year old suffered a fall at home today after tripping over a carpet. She had immediate right hip pain and was brought to the emergency room where she is found to have a displaced femoral neck fracture. She is very active 83 year old who bicycles every day walks regularly goes to the grocery store and is very active for her age. She denies any prodromal symptoms.  On exam her right leg is shortened and slightly externally rotated with intact pulses and able flex extend the toes. Skin is intact with no ecchymosis.  X-rays show completely displaced femoral neck fracture with moderate hip osteoarthritis  Impression is right femoral neck fracture displaced with osteoarthritis  Plan: Right hip replacement tomorrow, risks benefits possible complications discussed and site marked

## 2016-12-04 NOTE — Clinical Social Work Note (Signed)
Clinical Social Work Assessment  Patient Details  Name: Yvonne Edwards MRN: [id number] Date of Birth: 07-24-1918  Date of referral:  12/04/16               Reason for consult:  Facility Placement                Permission sought to share information with:  Family Supports Permission granted to share information::  Yes, Verbal Permission Granted  Name::     Health Care POA  Christiane Ha [phone] [name] ( care taker)  Agency::  yes- all facilities  Relationship::     Contact Information:     Housing/Transportation Living arrangements for the past 2 months:  Single Family Home Source of Information:  Patient, Power of Attorney Patient Interpreter Needed:  None Criminal Activity/Legal Involvement Pertinent to Current Situation/Hospitalization:  No - Comment as needed Significant Relationships:  Neighbor, Friend Lives with:  Self Do you feel safe going back to the place where you live?  Yes Need for family participation in patient care:  Yes (Comment)  Care giving concerns: None   Office manager / plan: LCSW introduced myself to patient and her her care taker and asked for verbal consent to conduct her assessment with her friends and HCPOA present and verbal consent was given. Patient is a  83 year old female. Earlier today she was in her bedroom and tripped over a carpet and fell. Hip x-ray in the ER shows a right hip fracture. The patient is a very healthy 83 year old. She rises stationary bike every day. She's had no recent chest pain or shortness of breath or any other cardiac symptoms. She has no history of coronary artery disease. She is independent with her ADLs and needs only minimal assistance and lives on her own. She has caretaker who visits daily and assists and with light meal preparation. She is on Reg diet, is verbal oriented x4 and is able to verbalize her basic needs. She is HARD of HEARING and has trouble with her eyes ( Macular degeneration) and  incontinent. She is agreeable to go to SNF after her surgery for PT. Her insurance is Medicare /BCBS  Christiane Ha is health care power of attorney [phone].  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  Emotional Assessment Appearance:  Appears younger than stated age Attitude/Demeanor/Rapport:   (Very pleasant and calm oriented x4) Affect (typically observed):  Accepting, Adaptable, Calm Orientation:  Oriented to Self, Oriented to Place, Oriented to  Time, Oriented to Situation Alcohol / Substance use:  Not Applicable Psych involvement (Current and /or in the community):  No (Comment)  Discharge Needs  Concerns to be addressed:  No discharge needs identified Readmission within the last 30 days:  No Current discharge risk:  None Barriers to Discharge:  Continued Medical Work up   Cheron Schaumann, LCSW 12/04/2016, 4:54 PM

## 2016-12-04 NOTE — NC FL2 (Signed)
  [place] MEDICAID FL2 LEVEL OF CARE SCREENING TOOL     IDENTIFICATION  Patient Name: Yvonne Edwards Birthdate: 1918-12-29 Sex: female Admission Date (Current Location): 12/04/2016  [place] and [place] Number:  Chiropodist and Address:  [facility], [address] [place], [place] [address]      Provider Number: [id number]  Attending Physician Name and Address:  Gracelyn Nurse, MD  Relative Name and Phone Number:  HCPOA Christiane Ha [phone]    Current Level of Care: Home Recommended Level of Care: Skilled Nursing Facility Prior Approval Number:    Date Approved/Denied:   PASRR Number:  [id number]   Discharge Plan: SNF    Current Diagnoses: Patient Active Problem List   Diagnosis Date Noted  . Hip fracture (HCC) 12/04/2016    Orientation RESPIRATION BLADDER Height & Weight     Self, Time, Situation, Place  Normal Incontinent Weight: 108 lb (49 kg) Height:  5\' 3"  (160 cm)  BEHAVIORAL SYMPTOMS/MOOD NEUROLOGICAL BOWEL NUTRITION STATUS      Incontinent Diet (Normal)  AMBULATORY STATUS COMMUNICATION OF NEEDS Skin   Supervision Verbally Normal                       Personal Care Assistance Level of Assistance  Bathing, Feeding, Dressing, Total care Bathing Assistance: Independent Feeding assistance: Independent Dressing Assistance: Independent Total Care Assistance: Independent   Functional Limitations Info  Sight, Hearing, Speech (Macular degeneration) Sight Info: Impaired Hearing Info: Impaired (Hard of hearing) Speech Info: Adequate    SPECIAL CARE FACTORS FREQUENCY  PT (By licensed PT), OT (By licensed OT)     PT Frequency: x5 OT Frequency: x5            Contractures Contractures Info: Not present    Additional Factors Info  Allergies   Allergies Info: [place] and shellfish           Current Medications (12/04/2016):  This is the current hospital active medication list No current  facility-administered medications for this encounter.    Current Outpatient Prescriptions  Medication Sig Dispense Refill  . Multiple Vitamins-Minerals (PRESERVISION AREDS 2 PO) Take 1 capsule by mouth daily.     . naproxen sodium (ANAPROX) 220 MG tablet Take 220 mg by mouth daily as needed.     . rosuvastatin (CRESTOR) 10 MG tablet Take 10 mg by mouth daily.  1     Discharge Medications: Please see discharge summary for a list of discharge medications.  Relevant Imaging Results:  Relevant Lab Results:   Additional Information SSN # [id number]  Cheron Schaumann, [place]

## 2016-12-04 NOTE — ED Triage Notes (Signed)
Patient arrived by EMS from home for mechanical fall. Patient uses cane to ambulate and reports she was looking out window and turned around and lost her balance. A&O x4 upon arrival to ER. Denies hitting head. C/O pain in R hip

## 2016-12-05 ENCOUNTER — Inpatient Hospital Stay: Payer: Medicare Other

## 2016-12-05 ENCOUNTER — Encounter
Admission: EM | Disposition: A | Discharge status: Skilled Nursing Facility | Payer: Self-pay | Source: Home / Self Care | Attending: Internal Medicine

## 2016-12-05 ENCOUNTER — Inpatient Hospital Stay: Payer: Medicare Other | Admitting: Anesthesiology

## 2016-12-05 ENCOUNTER — Encounter: Payer: Self-pay | Admitting: Anesthesiology

## 2016-12-05 HISTORY — PX: TOTAL HIP ARTHROPLASTY: SHX124

## 2016-12-05 LAB — BASIC METABOLIC PANEL
ANION GAP: 5 (ref 5–15)
BUN: 16 mg/dL (ref 6–20)
CALCIUM: 8.1 mg/dL — AB (ref 8.9–10.3)
CO2: 28 mmol/L (ref 22–32)
CREATININE: 0.91 mg/dL (ref 0.44–1.00)
Chloride: 104 mmol/L (ref 101–111)
GFR calc Af Amer: 60 mL/min — ABNORMAL LOW (ref >60)
GFR, EST NON AFRICAN AMERICAN: 52 mL/min — AB (ref >60)
GLUCOSE: 124 mg/dL — AB (ref 65–99)
Potassium: 4.5 mmol/L (ref 3.5–5.1)
Sodium: 137 mmol/L (ref 135–145)

## 2016-12-05 LAB — CBC
HCT: 40.3 % (ref 35.0–47.0)
HEMOGLOBIN: 13.6 g/dL (ref 12.0–16.0)
MCH: 30.3 pg (ref 26.0–34.0)
MCHC: 33.6 g/dL (ref 32.0–36.0)
MCV: 90.1 fL (ref 80.0–100.0)
PLATELETS: 143 10*3/uL — AB (ref 150–440)
RBC: 4.47 MIL/uL (ref 3.80–5.20)
RDW: 13 % (ref 11.5–14.5)
WBC: 13.2 10*3/uL — ABNORMAL HIGH (ref 3.6–11.0)

## 2016-12-05 LAB — SURGICAL PCR SCREEN
MRSA, PCR: POSITIVE — AB
STAPHYLOCOCCUS AUREUS: POSITIVE — AB

## 2016-12-05 LAB — HEMOGLOBIN A1C
Hgb A1c MFr Bld: 5.9 % — ABNORMAL HIGH (ref 4.8–5.6)
Mean Plasma Glucose: 122.63 mg/dL

## 2016-12-05 SURGERY — ARTHROPLASTY, HIP, TOTAL, ANTERIOR APPROACH
Anesthesia: Spinal | Site: Hip | Laterality: Right | Wound class: Clean

## 2016-12-05 MED ORDER — ACETAMINOPHEN 10 MG/ML IV SOLN
INTRAVENOUS | Status: AC
  Filled 2016-12-05: qty 100

## 2016-12-05 MED ORDER — KETAMINE HCL 50 MG/ML IJ SOLN
INTRAMUSCULAR | Status: AC
  Filled 2016-12-05: qty 10

## 2016-12-05 MED ORDER — ENOXAPARIN SODIUM 30 MG/0.3ML ~~LOC~~ SOLN
30.0000 mg | SUBCUTANEOUS | Status: DC
  Administered 2016-12-06 – 2016-12-07 (×2): 30 mg via SUBCUTANEOUS
  Filled 2016-12-05 (×2): qty 0.3

## 2016-12-05 MED ORDER — METHOCARBAMOL 500 MG PO TABS: 500.0000 mg | ORAL_TABLET | Freq: Four times a day (QID) | ORAL | Status: DC | PRN

## 2016-12-05 MED ORDER — MORPHINE SULFATE (PF) 2 MG/ML IV SOLN: 1.0000 mg | INTRAVENOUS | Status: DC | PRN

## 2016-12-05 MED ORDER — NEOMYCIN-POLYMYXIN B GU 40-200000 IR SOLN
Status: DC | PRN
  Administered 2016-12-05: 4 mL

## 2016-12-05 MED ORDER — PROPOFOL 500 MG/50ML IV EMUL
INTRAVENOUS | Status: AC
  Filled 2016-12-05: qty 50

## 2016-12-05 MED ORDER — PHENYLEPHRINE HCL 10 MG/ML IJ SOLN
INTRAMUSCULAR | Status: DC | PRN
  Administered 2016-12-05: 100 ug via INTRAVENOUS
  Administered 2016-12-05: 50 ug via INTRAVENOUS
  Administered 2016-12-05 (×2): 100 ug via INTRAVENOUS

## 2016-12-05 MED ORDER — DIPHENHYDRAMINE HCL 12.5 MG/5ML PO ELIX: 12.5000 mg | ORAL_SOLUTION | ORAL | Status: DC | PRN

## 2016-12-05 MED ORDER — BUPIVACAINE-EPINEPHRINE (PF) 0.25% -1:200000 IJ SOLN
INTRAMUSCULAR | Status: AC
  Filled 2016-12-05: qty 30

## 2016-12-05 MED ORDER — BISACODYL 10 MG RE SUPP
10.0000 mg | Freq: Every day | RECTAL | Status: DC | PRN
  Administered 2016-12-07: 10 mg via RECTAL
  Filled 2016-12-05: qty 1

## 2016-12-05 MED ORDER — SODIUM CHLORIDE 0.9 % IV SOLN: INTRAVENOUS | Status: DC

## 2016-12-05 MED ORDER — MIDAZOLAM HCL 2 MG/2ML IJ SOLN
INTRAMUSCULAR | Status: AC
  Filled 2016-12-05: qty 2

## 2016-12-05 MED ORDER — ALUM & MAG HYDROXIDE-SIMETH 200-200-20 MG/5ML PO SUSP: 30.0000 mL | ORAL | Status: DC | PRN

## 2016-12-05 MED ORDER — DOCUSATE SODIUM 100 MG PO CAPS
100.0000 mg | ORAL_CAPSULE | Freq: Two times a day (BID) | ORAL | Status: DC
  Administered 2016-12-05 – 2016-12-07 (×5): 100 mg via ORAL
  Filled 2016-12-05 (×5): qty 1

## 2016-12-05 MED ORDER — PROPOFOL 500 MG/50ML IV EMUL
INTRAVENOUS | Status: DC | PRN
  Administered 2016-12-05: 30 ug/kg/min via INTRAVENOUS

## 2016-12-05 MED ORDER — PHENOL 1.4 % MT LIQD
1.0000 | OROMUCOSAL | Status: DC | PRN
  Filled 2016-12-05: qty 177

## 2016-12-05 MED ORDER — PHENYLEPHRINE HCL 10 MG/ML IJ SOLN
INTRAMUSCULAR | Status: DC | PRN
  Administered 2016-12-05: 20 ug/min via INTRAVENOUS

## 2016-12-05 MED ORDER — ACETAMINOPHEN 10 MG/ML IV SOLN
INTRAVENOUS | Status: DC | PRN
  Administered 2016-12-05: 750 mg via INTRAVENOUS

## 2016-12-05 MED ORDER — MUPIROCIN 2 % EX OINT
1.0000 "application " | TOPICAL_OINTMENT | Freq: Two times a day (BID) | CUTANEOUS | Status: DC
  Administered 2016-12-05 – 2016-12-07 (×5): 1 via NASAL
  Filled 2016-12-05: qty 22

## 2016-12-05 MED ORDER — NEOMYCIN-POLYMYXIN B GU 40-200000 IR SOLN
Status: AC
  Filled 2016-12-05: qty 1

## 2016-12-05 MED ORDER — FENTANYL CITRATE (PF) 100 MCG/2ML IJ SOLN
INTRAMUSCULAR | Status: AC
  Filled 2016-12-05: qty 2

## 2016-12-05 MED ORDER — MENTHOL 3 MG MT LOZG
1.0000 | LOZENGE | OROMUCOSAL | Status: DC | PRN
  Filled 2016-12-05: qty 9

## 2016-12-05 MED ORDER — BUPIVACAINE-EPINEPHRINE 0.25% -1:200000 IJ SOLN
INTRAMUSCULAR | Status: DC | PRN
  Administered 2016-12-05: 20 mL

## 2016-12-05 MED ORDER — MAGNESIUM HYDROXIDE 400 MG/5ML PO SUSP
30.0000 mL | Freq: Every day | ORAL | Status: DC | PRN
  Administered 2016-12-06 – 2016-12-07 (×2): 30 mL via ORAL
  Filled 2016-12-05 (×2): qty 30

## 2016-12-05 MED ORDER — ACETAMINOPHEN 500 MG PO TABS
1000.0000 mg | ORAL_TABLET | Freq: Four times a day (QID) | ORAL | Status: AC
  Administered 2016-12-05: 1000 mg via ORAL
  Filled 2016-12-05 (×2): qty 2

## 2016-12-05 MED ORDER — MAGNESIUM CITRATE PO SOLN
1.0000 | Freq: Once | ORAL | Status: DC | PRN
  Filled 2016-12-05: qty 296

## 2016-12-05 MED ORDER — SODIUM CHLORIDE 0.9 % IV SOLN: INTRAVENOUS | Status: DC | PRN

## 2016-12-05 MED ORDER — MIDAZOLAM HCL 5 MG/5ML IJ SOLN
INTRAMUSCULAR | Status: DC | PRN
  Administered 2016-12-05: 0.5 mg via INTRAVENOUS

## 2016-12-05 MED ORDER — FENTANYL CITRATE (PF) 100 MCG/2ML IJ SOLN: 25.0000 ug | INTRAMUSCULAR | Status: DC | PRN

## 2016-12-05 MED ORDER — ONDANSETRON HCL 4 MG PO TABS: 4.0000 mg | ORAL_TABLET | Freq: Four times a day (QID) | ORAL | Status: DC | PRN

## 2016-12-05 MED ORDER — GLYCOPYRROLATE 0.2 MG/ML IJ SOLN
INTRAMUSCULAR | Status: DC | PRN
  Administered 2016-12-05: 0.2 mg via INTRAVENOUS

## 2016-12-05 MED ORDER — BUPIVACAINE HCL (PF) 0.75 % IJ SOLN
INTRAMUSCULAR | Status: AC
  Filled 2016-12-05: qty 10

## 2016-12-05 MED ORDER — SODIUM CHLORIDE 0.9 % IV SOLN
500.0000 mg | Freq: Once | INTRAVENOUS | Status: AC
  Administered 2016-12-05: 500 mg via INTRAVENOUS
  Filled 2016-12-05 (×2): qty 500

## 2016-12-05 MED ORDER — LIDOCAINE HCL (PF) 2 % IJ SOLN
INTRAMUSCULAR | Status: AC
  Filled 2016-12-05: qty 2

## 2016-12-05 MED ORDER — KETAMINE HCL 10 MG/ML IJ SOLN
INTRAMUSCULAR | Status: DC | PRN
  Administered 2016-12-05: 25 mg via INTRAVENOUS

## 2016-12-05 MED ORDER — CHLORHEXIDINE GLUCONATE CLOTH 2 % EX PADS
6.0000 | MEDICATED_PAD | Freq: Every day | CUTANEOUS | Status: DC
  Administered 2016-12-06 – 2016-12-07 (×2): 6 via TOPICAL

## 2016-12-05 MED ORDER — METHOCARBAMOL 1000 MG/10ML IJ SOLN
500.0000 mg | Freq: Four times a day (QID) | INTRAVENOUS | Status: DC | PRN
  Filled 2016-12-05: qty 5

## 2016-12-05 MED ORDER — HEMOSTATIC AGENTS (NO CHARGE) OPTIME
TOPICAL | Status: DC | PRN
  Administered 2016-12-05 (×2): 1 via TOPICAL

## 2016-12-05 MED ORDER — ZOLPIDEM TARTRATE 5 MG PO TABS: 5.0000 mg | ORAL_TABLET | Freq: Every evening | ORAL | Status: DC | PRN

## 2016-12-05 MED ORDER — ONDANSETRON HCL 4 MG/2ML IJ SOLN: 4.0000 mg | Freq: Once | INTRAMUSCULAR | Status: DC | PRN

## 2016-12-05 MED ORDER — HYDROCODONE-ACETAMINOPHEN 5-325 MG PO TABS
1.0000 | ORAL_TABLET | ORAL | Status: DC | PRN
  Administered 2016-12-05 – 2016-12-07 (×6): 2 via ORAL
  Administered 2016-12-07: 1 via ORAL
  Filled 2016-12-05 (×2): qty 2
  Filled 2016-12-05: qty 1
  Filled 2016-12-05 (×4): qty 2

## 2016-12-05 MED ORDER — ONDANSETRON HCL 4 MG/2ML IJ SOLN
4.0000 mg | Freq: Four times a day (QID) | INTRAMUSCULAR | Status: DC | PRN
  Administered 2016-12-05: 4 mg via INTRAVENOUS
  Filled 2016-12-05: qty 2

## 2016-12-05 SURGICAL SUPPLY — 50 items
BLADE SAW SAG 18.5X105 (BLADE) ×3 IMPLANT
BNDG COHESIVE 6X5 TAN STRL LF (GAUZE/BANDAGES/DRESSINGS) ×9 IMPLANT
CANISTER SUCT 1200ML W/VALVE (MISCELLANEOUS) ×3 IMPLANT
CAPT HIP TOTAL 3 ×3 IMPLANT
CATH FOL LEG HOLDER (MISCELLANEOUS) ×3 IMPLANT
CATH TRAY METER 16FR LF (MISCELLANEOUS) ×3 IMPLANT
CHLORAPREP W/TINT 26ML (MISCELLANEOUS) ×3 IMPLANT
DRAPE C-ARM XRAY 36X54 (DRAPES) ×3 IMPLANT
DRAPE INCISE IOBAN 66X60 STRL (DRAPES) IMPLANT
DRAPE POUCH INSTRU U-SHP 10X18 (DRAPES) ×3 IMPLANT
DRAPE SHEET LG 3/4 BI-LAMINATE (DRAPES) ×9 IMPLANT
DRAPE TABLE BACK 80X90 (DRAPES) ×3 IMPLANT
DRESSING SURGICEL FIBRLLR 1X2 (HEMOSTASIS) ×4 IMPLANT
DRSG OPSITE POSTOP 4X8 (GAUZE/BANDAGES/DRESSINGS) ×3 IMPLANT
DRSG SURGICEL FIBRILLAR 1X2 (HEMOSTASIS) ×12
ELECT BLADE 6.5 EXT (BLADE) ×3 IMPLANT
ELECT REM PT RETURN 9FT ADLT (ELECTROSURGICAL) ×3
ELECTRODE REM PT RTRN 9FT ADLT (ELECTROSURGICAL) ×1 IMPLANT
EVACUATOR 1/8 PVC DRAIN (DRAIN) IMPLANT
GLOVE BIOGEL PI IND STRL 9 (GLOVE) ×1 IMPLANT
GLOVE BIOGEL PI INDICATOR 9 (GLOVE) ×2
GLOVE SURG SYN 9.0  PF PI (GLOVE) ×4
GLOVE SURG SYN 9.0 PF PI (GLOVE) ×2 IMPLANT
GOWN SRG 2XL LVL 4 RGLN SLV (GOWNS) ×1 IMPLANT
GOWN STRL NON-REIN 2XL LVL4 (GOWNS) ×2
GOWN STRL REUS W/ TWL LRG LVL3 (GOWN DISPOSABLE) ×1 IMPLANT
GOWN STRL REUS W/TWL LRG LVL3 (GOWN DISPOSABLE) ×2
HOOD PEEL AWAY FLYTE STAYCOOL (MISCELLANEOUS) ×3 IMPLANT
KIT PREVENA INCISION MGT 13 (CANNISTER) ×3 IMPLANT
MAT BLUE FLOOR 46X72 FLO (MISCELLANEOUS) ×3 IMPLANT
NDL SAFETY 18GX1.5 (NEEDLE) ×3 IMPLANT
NEEDLE SPNL 18GX3.5 QUINCKE PK (NEEDLE) ×3 IMPLANT
NS IRRIG 1000ML POUR BTL (IV SOLUTION) ×3 IMPLANT
PACK HIP COMPR (MISCELLANEOUS) ×3 IMPLANT
SOL PREP PVP 2OZ (MISCELLANEOUS) ×3
SOLUTION PREP PVP 2OZ (MISCELLANEOUS) ×1 IMPLANT
SPONGE DRAIN TRACH 4X4 STRL 2S (GAUZE/BANDAGES/DRESSINGS) ×3 IMPLANT
STAPLER SKIN PROX 35W (STAPLE) ×3 IMPLANT
STRAP SAFETY BODY (MISCELLANEOUS) ×3 IMPLANT
SUT DVC 2 QUILL PDO  T11 36X36 (SUTURE) ×2
SUT DVC 2 QUILL PDO T11 36X36 (SUTURE) ×1 IMPLANT
SUT SILK 0 (SUTURE) ×2
SUT SILK 0 30XBRD TIE 6 (SUTURE) ×1 IMPLANT
SUT V-LOC 90 ABS DVC 3-0 CL (SUTURE) ×3 IMPLANT
SUT VIC AB 1 CT1 36 (SUTURE) ×3 IMPLANT
SYR 20CC LL (SYRINGE) ×3 IMPLANT
SYR 30ML LL (SYRINGE) ×3 IMPLANT
TAPE MICROFOAM 4IN (TAPE) ×3 IMPLANT
TOWEL OR 17X26 4PK STRL BLUE (TOWEL DISPOSABLE) ×3 IMPLANT
WND VAC CANISTER 500ML (MISCELLANEOUS) ×3 IMPLANT

## 2016-12-05 NOTE — Progress Notes (Signed)
[facility] [place] [facility]   PATIENT NAME: Yvonne Edwards    MR#:  [id number]  DATE OF BIRTH:  1918-08-25  SUBJECTIVE:  CHIEF COMPLAINT:   Chief Complaint  Patient presents with  . Fall  The patient is 83 year old Caucasian female with no significant past medical history, who presented to the hospital with complaints of fall and right hip pain. X-ray in the hospital revealed right femoral neck fracture.. Patient denies any chest pains or shortness of breath on exertion, she was active prior to fall. EKG was normal. Now she complains of right hip area pain   Review of Systems  Constitutional: Negative for chills, fever and weight loss.  HENT: Negative for congestion.   Eyes: Negative for blurred vision and double vision.  Respiratory: Negative for cough, sputum production, shortness of breath and wheezing.   Cardiovascular: Negative for chest pain, palpitations, orthopnea, leg swelling and PND.  Gastrointestinal: Negative for abdominal pain, blood in stool, constipation, diarrhea, nausea and vomiting.  Genitourinary: Negative for dysuria, frequency, hematuria and urgency.  Musculoskeletal: Positive for joint pain. Negative for falls.  Neurological: Negative for dizziness, tremors, focal weakness and headaches.  Endo/Heme/Allergies: Does not bruise/bleed easily.  Psychiatric/Behavioral: Negative for depression. The patient does not have insomnia.     VITAL SIGNS: Blood pressure (!) 155/86, pulse (!) 105, temperature 99.7 F (37.6 C), temperature source Tympanic, resp. rate 18, height 5\' 3"  (1.6 m), weight 49 kg (108 lb), SpO2 93 %.  PHYSICAL EXAMINATION:   GENERAL:  83 y.o.-year-old patient lying in the bed , in mild-to-moderate distress due to significant right hip area pain, distorted anatomy visible through the blankets .  EYES: Pupils equal, round, reactive to light and accommodation. No scleral icterus. Extraocular muscles intact.  HEENT: Head  atraumatic, normocephalic. Oropharynx and nasopharynx clear.  NECK:  Supple, no jugular venous distention. No thyroid enlargement, no tenderness.  LUNGS: Normal breath sounds bilaterally, no wheezing, rales,rhonchi or crepitation. No use of accessory muscles of respiration.  CARDIOVASCULAR: S1, S2 normal. No murmurs, rubs, or gallops.  ABDOMEN: Soft, nontender, nondistended. Bowel sounds present. No organomegaly or mass.  EXTREMITIES: No pedal edema, cyanosis, or clubbing.  distorted anatomy of right hip area, swollen thigh, painful to palpation  NEUROLOGIC: Cranial nerves II through XII are intact. Muscle strength 5/5 in all extremities. Sensation intact. Gait not checked.  PSYCHIATRIC: The patient is alert and oriented x 3.  SKIN: No obvious rash, lesion, or ulcer.   ORDERS/RESULTS REVIEWED:   CBC  Recent Labs Lab 12/04/16 1507 12/05/16 0306  WBC 6.2 13.2*  HGB 14.1 13.6  HCT 42.3 40.3  PLT 154 143*  MCV 89.1 90.1  MCH 29.7 30.3  MCHC 33.3 33.6  RDW 12.8 13.0  LYMPHSABS 1.1  --   MONOABS 0.5  --   EOSABS 0.3  --   BASOSABS 0.1  --    ------------------------------------------------------------------------------------------------------------------  Chemistries   Recent Labs Lab 12/04/16 1507 12/05/16 0306  NA 138 137  K 4.5 4.5  CL 104 104  CO2 27 28  GLUCOSE 128* 124*  BUN 19 16  CREATININE 0.87 0.91  CALCIUM 8.8* 8.1*   ------------------------------------------------------------------------------------------------------------------ estimated creatinine clearance is 28 mL/min (by C-G formula based on SCr of 0.91 mg/dL). ------------------------------------------------------------------------------------------------------------------ No results for input(s): TSH, T4TOTAL, T3FREE, THYROIDAB in the last 72 hours.  Invalid input(s): FREET3  Cardiac Enzymes No results for input(s): CKMB, TROPONINI, MYOGLOBIN in the last 168 hours.  Invalid input(s):  CK ------------------------------------------------------------------------------------------------------------------  Invalid input(s): POCBNP ---------------------------------------------------------------------------------------------------------------  RADIOLOGY: Dg Chest 1 View  Result Date: 12/04/2016 CLINICAL DATA:  Pain after fall. EXAM: CHEST 1 VIEW COMPARISON:  December 07, 2012 FINDINGS: Given the supine positioning, the cardiomediastinal silhouette is unchanged. No pneumothorax. No pulmonary nodules or masses. No focal infiltrates. IMPRESSION: No active disease. Electronically Signed   By: Gerome Sam III M.D   On: 12/04/2016 15:35   Dg Hip Unilat With Pelvis 2-3 Views Right  Result Date: 12/04/2016 CLINICAL DATA:  Pain after fall. EXAM: DG HIP (WITH OR WITHOUT PELVIS) 2-3V RIGHT COMPARISON:  None. FINDINGS: There is a fracture through the right femoral neck with mild displacement. No evidence of femoral head dislocation. No other fractures are identified. IMPRESSION: Fracture through the right femoral neck. Electronically Signed   By: Gerome Sam III M.D   On: 12/04/2016 15:34    EKG:  Orders placed or performed in visit on 12/08/12  . EKG 12-Lead  . EKG 12-Lead  . EKG 12-Lead    ASSESSMENT AND PLAN:  Active Problems:   Hip fracture (HCC)  #1 right femoral neck fracture, closed, initial encounter, continue pain medications, orthopedic surgeon recommended right hip replacement, patient is stable to go to operating room, risks as well as benefits were discussed. Physical therapy as per ortho surgery, may need to go to skilled nursing facility for rehabilitation #2. Hyperglycemia, get hemoglobin A1c to rule out diabetes #3. Marland Kitchen Thrombocytopenia, likely consumption, follow #4. Leukocytosis, likely due to stress, chest x-ray was unremarkable, as well as urinalysis , follow in the morning  #5. MRSA carrier, initiated on the mupirocin intranasally, to be continued for 5 days   Management plans discussed with the patient, family and they are in agreement.   DRUG ALLERGIES:  Allergies  Allergen Reactions  . Codeine Other (See Comments)  . Shellfish Allergy Nausea And Vomiting    CODE STATUS:     Code Status Orders        Start     Ordered   12/04/16 1741  Full code  Continuous     12/04/16 1740    Code Status History    Date Active Date Inactive Code Status Order ID Comments User Context   This patient has a current code status but no historical code status.    Advance Directive Documentation     Most Recent Value  Type of Advance Directive  Healthcare Power of Attorney  Pre-existing out of facility DNR order (yellow form or pink MOST form)  -  "MOST" Form in Place?  -      TOTAL TIME TAKING CARE OF THIS PATIENT35  minutes.    Katharina Caper M.D on 12/05/2016 at 11:35 AM  Between 7am to 6pm - Pager - [phone]  After 6pm go to [email or web address] - password [email or web address] [facility]  [place] [facility]  Office  [phone]  CC: Primary care physician; [name], MD

## 2016-12-05 NOTE — Transfer of Care (Signed)
Immediate Anesthesia Transfer of Care Note  Patient: Yvonne Edwards  Procedure(s) Performed: Procedure(s): TOTAL HIP ARTHROPLASTY ANTERIOR APPROACH (Right)  Patient Location: PACU  Anesthesia Type:Spinal  Level of Consciousness: awake, alert  and oriented  Airway & Oxygen Therapy: Patient Spontanous Breathing and Patient connected to face mask oxygen  Post-op Assessment: Report given to RN and Post -op Vital signs reviewed and stable  Post vital signs: Reviewed and stable  Last Vitals:  Vitals:   12/05/16 1051 12/05/16 1311  BP: (!) 155/86 (!) 110/38  Pulse:  95  Resp:  (!) 21  Temp:  (!) 36.1 C  SpO2:  96%    Last Pain:  Vitals:   12/05/16 1311  TempSrc: Temporal  PainSc:          Complications: No apparent anesthesia complications

## 2016-12-05 NOTE — Evaluation (Signed)
Physical Therapy Evaluation Patient Details Name: Yvonne Edwards MRN: [id number] DOB: [birth year] Today's Date: 12/05/2016   History of Present Illness  Pt is a 83 y.o. female presenting to hospital s/p mechanical fall sustaining R femoral neck fx.  Pt s/p R THA anterior approach 12/05/16.  PMH includes HOH and macular degeneration.  Clinical Impression  Prior to hospital admission, pt was modified independent ambulating with SPC.  Pt lives alone but has a caretaker that checks on pt daily and assists pt as needed.  Currently pt is mod to max assist with bed mobility.  Deferred further mobility d/t pt c/o increased R hip pain with mobility and sitting edge of bed and pt requesting back to bed instead.  Pt would benefit from skilled PT to address noted impairments and functional limitations (see below for any additional details).  Upon hospital discharge, recommend pt discharge to STR.    Follow Up Recommendations SNF    Equipment Recommendations  Rolling walker with 5" wheels    Recommendations for Other Services       Precautions / Restrictions Precautions Precautions: Anterior Hip;Fall Precaution Booklet Issued: No Restrictions Weight Bearing Restrictions: Yes RLE Weight Bearing: Weight bearing as tolerated      Mobility  Bed Mobility Overal bed mobility: Needs Assistance Bed Mobility: Supine to Sit;Sit to Supine     Supine to sit: Mod assist;Max assist;HOB elevated Sit to supine: Mod assist;Max assist;HOB elevated   General bed mobility comments: assist for R LE and trunk supine to/from sit; increased effort to perform (requiring increased assist from therapist); vc's for technique required  Transfers                 General transfer comment: Deferred d/t pt c/o increased R hip pain with mobility and sitting edge of bed  Ambulation/Gait             General Gait Details: Deferred d/t pt c/o increased R hip pain with mobility and sitting edge of bed  Stairs             Wheelchair Mobility    Modified Rankin (Stroke Patients Only)       Balance Overall balance assessment: Needs assistance;History of Falls Sitting-balance support: Bilateral upper extremity supported;Feet unsupported Sitting balance-Leahy Scale: Poor Sitting balance - Comments: pt with intermittent loss of balance backwards requiring min assist to regain sitting balance                                     Pertinent Vitals/Pain Pain Assessment: Faces Faces Pain Scale: Hurts little more (0/10 beginning of session; 6-8/10 with bed mobility and sitting edge of bed; 4/10 end of session resting in bed) Pain Location: R hip Pain Descriptors / Indicators: Sore;Operative site guarding;Grimacing;Guarding Pain Intervention(s): Limited activity within patient's tolerance;Monitored during session;Premedicated before session;Repositioned  Vitals stable (O2 90-91% on 2 L O2 via nasal cannula and HR in upper 90's) throughout treatment session.    Home Living Family/patient expects to be discharged to:: Private residence Living Arrangements: Alone Available Help at Discharge: Personal care attendant           Home Equipment: Dan Humphreys - 2 wheels;Cane - single point;Grab bars - toilet;Grab bars - tub/shower;Shower seat - built in      Prior Function Level of Independence: Needs assistance   Gait / Transfers Assistance Needed: Modified independent with SPC.  ADL's / Homemaking Assistance Needed: Assist  with light meal prep, groceries, driving.  Comments: Active riding stationary bike, walking.     Hand Dominance        Extremity/Trunk Assessment   Upper Extremity Assessment Upper Extremity Assessment: Generalized weakness    Lower Extremity Assessment Lower Extremity Assessment:  (L LE WFL; R DF at least 3+/5; good R quad set; R hip flexion at least 3-/5 (limited d/t pain))    Cervical / Trunk Assessment Cervical / Trunk Assessment: Normal   Communication   Communication: HOH  Cognition Arousal/Alertness: Awake/alert Behavior During Therapy: WFL for tasks assessed/performed Overall Cognitive Status: Within Functional Limits for tasks assessed                                        General Comments General comments (skin integrity, edema, etc.): no drainage noted R hip dressing; foley in place.  Nursing cleared pt for participation in physical therapy.  Pt agreeable to PT session.    Exercises Total Joint Exercises Ankle Circles/Pumps: AROM;Strengthening;Both;10 reps;Supine Quad Sets: AROM;Strengthening;Both;10 reps;Supine Short Arc Quad: AAROM;Strengthening;Right;10 reps;Supine Heel Slides: AAROM;Strengthening;Right;10 reps;Supine Hip ABduction/ADduction: AAROM;Strengthening;Right;10 reps;Supine  Pt requiring vc's for above exercise technique.   Assessment/Plan    PT Assessment Patient needs continued PT services  PT Problem List Decreased strength;Decreased activity tolerance;Decreased balance;Decreased mobility;Decreased knowledge of use of DME;Decreased knowledge of precautions;Pain       PT Treatment Interventions DME instruction;Gait training;Functional mobility training;Therapeutic activities;Therapeutic exercise;Balance training;Patient/family education    PT Goals (Current goals can be found in the Care Plan section)  Acute Rehab PT Goals Patient Stated Goal: to be able to walk again PT Goal Formulation: With patient Time For Goal Achievement: 12/19/16 Potential to Achieve Goals: Good    Frequency BID   Barriers to discharge Decreased caregiver support      Co-evaluation               AM-PAC PT "6 Clicks" Daily Activity  Outcome Measure Difficulty turning over in bed (including adjusting bedclothes, sheets and blankets)?: Unable Difficulty moving from lying on back to sitting on the side of the bed? : Unable Difficulty sitting down on and standing up from a chair with arms  (e.g., wheelchair, bedside commode, etc,.)?: Unable Help needed moving to and from a bed to chair (including a wheelchair)?: A Lot Help needed walking in hospital room?: Total Help needed climbing 3-5 steps with a railing? : Total 6 Click Score: 7    End of Session Equipment Utilized During Treatment: Gait belt;Oxygen (2 L O2 via nasal cannula) Activity Tolerance: Patient limited by pain Patient left: in bed;with call bell/phone within reach;with bed alarm set;with family/visitor present;with SCD's reapplied (B heels elevated via towel rolls) Nurse Communication: Mobility status;Precautions PT Visit Diagnosis: Other abnormalities of gait and mobility (R26.89);Muscle weakness (generalized) (M62.81);History of falling (Z91.81);Pain Pain - Right/Left: Right Pain - part of body: Hip    Time: 1550-1620 PT Time Calculation (min) (ACUTE ONLY): 30 min   Charges:   PT Evaluation $PT Eval Low Complexity: 1 Low PT Treatments $Therapeutic Exercise: 8-22 mins   PT G Codes:   PT G-Codes **NOT FOR INPATIENT CLASS** Functional Assessment Tool Used: AM-PAC 6 Clicks Basic Mobility Functional Limitation: Mobility: Walking and moving around Mobility: Walking and Moving Around Current Status (Z6109): At least 80 percent but less than 100 percent impaired, limited or restricted Mobility: Walking and Moving Around Goal Status [phone]): At least  20 percent but less than 40 percent impaired, limited or restricted    Hendricks Limes, PT 12/05/16, 5:20 PM [phone]

## 2016-12-05 NOTE — Anesthesia Post-op Follow-up Note (Signed)
Anesthesia QCDR form completed.        

## 2016-12-05 NOTE — Care Management Note (Signed)
Case Management Note  Patient Details  Name: Yvonne Edwards MRN: [id number] Date of Birth: 18-Feb-1919  Subjective/Objective: RNCM assessment for discharge planning. Right hip fracture. For surgery today. From home alone. Prior to admission, patient was very active, biking and walking.  Will follow progression.                 Action/Plan:   Expected Discharge Date:  12/07/16               Expected Discharge Plan:     In-House Referral:  Clinical Social Work  Discharge planning Services  CM Consult  Post Acute Care Choice:    Choice offered to:     DME Arranged:    DME Agency:     HH Arranged:    HH Agency:     Status of Service:  In process, will continue to follow  If discussed at Long Length of Stay Meetings, dates discussed:    Additional Comments:  Marily Memos, RN 12/05/2016, 8:47 AM

## 2016-12-05 NOTE — Progress Notes (Signed)
Pt down for surgery. Family followed. Meds sent down with pt. Both CHG baths given.

## 2016-12-05 NOTE — Progress Notes (Signed)
Pt back from surgery. VSS. A&O x4. 0/10 pain. Family at bedside. Dressing clean, dry, and intact.

## 2016-12-05 NOTE — Op Note (Signed)
12/04/2016 - 12/05/2016  1:20 PM  PATIENT:  Yvonne Edwards  83 y.o. female  PRE-OPERATIVE DIAGNOSIS:  right femoral neck fracture and osteoarthritis   POST-OPERATIVE DIAGNOSIS:  right femoral neck fracture and osteoarthritis  PROCEDURE:  Procedure(s): TOTAL HIP ARTHROPLASTY ANTERIOR APPROACH (Right)  SURGEON: Leitha Schuller, MD  ASSISTANTS: none  ANESTHESIA:   spinal  EBL:  Total I/O In: 400 [I.V.:400] Out: 750 [Urine:600; Blood:150]  BLOOD ADMINISTERED:none  DRAINS: none   LOCAL MEDICATIONS USED:  MARCAINE     SPECIMEN:  Source of Specimen:  right femoral head  DISPOSITION OF SPECIMEN:  PATHOLOGY  COUNTS:  YES  TOURNIQUET:  * No tourniquets in log *  IMPLANTS: Medacta AMIS standard 2 stem with 46 mm Mpact TM cup with liner and M 22.2 mm head  DICTATION: .Dragon Dictation   The patient was brought to the operating room and after spinal anesthesia was obtained patient was placed on the operative table with the ipsilateral foot into the Medacta attachment, contralateral leg on a well-padded table. C-arm was brought in and preop template x-ray taken. After prepping and draping in usual sterile fashion appropriate patient identification and timeout procedures were completed. Anterior approach to the hip was obtained and centered over the greater trochanter and TFL muscle. The subcutaneous tissue was incised hemostasis being achieved by electrocautery. TFL fascia was incised and the muscle retracted laterally deep retractor placed. The lateral femoral circumflex vessels were identified and ligated. The anterior capsule was exposed and a capsulotomy performed. The neck was identified and a femoral neck cut carried out with a saw. The head was removed without difficulty and showed sclerotic femoral head and acetabulum. Residual neck was removed at this time as well from the fracture Reaming was carried out to 46 mm and a 46 mm cup trial gave appropriate tightness to the acetabular  component a 46 DM cup was impacted into position. The leg was then externally rotated and ischiofemoral and pubofemoral releases carried out. The femur was sequentially broached to a size 2, size 2 standard width S head trials were placed and the final components chosen. The 2 standard stem was inserted along with a M 22.2 mm head and 46 mm liner. The hip was reduced and was stable the wound was thoroughly irrigated, fibrillar was then placed along the posterior capsule and medial neck. The deep fascia was closed using a heavy Quill after infiltration of 30 cc of quarter percent Sensorcaine with epinephrine. 3-0 v-loc was used subcuticularly l to close the skin with skin staples Xeroform and honeycomb dressing applied  PLAN OF CARE: Continue as inpatient

## 2016-12-05 NOTE — Anesthesia Preprocedure Evaluation (Addendum)
Anesthesia Evaluation  Patient identified by MRN, date of birth, ID band Patient awake    Reviewed: Allergy & Precautions, NPO status , Patient's Chart, lab work & pertinent test results, reviewed documented beta blocker date and time   Airway Mallampati: II  TM Distance: >3 FB     Dental  (+) Chipped, Missing, Poor Dentition   Pulmonary           Cardiovascular      Neuro/Psych    GI/Hepatic   Endo/Other    Renal/GU      Musculoskeletal   Abdominal   Peds  Hematology   Anesthesia Other Findings   Reproductive/Obstetrics                            Anesthesia Physical Anesthesia Plan  ASA: III  Anesthesia Plan: Spinal   Post-op Pain Management:    Induction:   PONV Risk Score and Plan:   Airway Management Planned:   Additional Equipment:   Intra-op Plan:   Post-operative Plan:   Informed Consent: I have reviewed the patients History and Physical, chart, labs and discussed the procedure including the risks, benefits and alternatives for the proposed anesthesia with the patient or authorized representative who has indicated his/her understanding and acceptance.     Plan Discussed with: CRNA  Anesthesia Plan Comments:         Anesthesia Quick Evaluation

## 2016-12-05 NOTE — Anesthesia Procedure Notes (Signed)
Spinal  Patient location during procedure: OR Staffing Anesthesiologist: Ranae Casebier Performed: anesthesiologist  Preanesthetic Checklist Completed: patient identified, site marked, surgical consent, pre-op evaluation, timeout performed, IV checked and risks and benefits discussed Spinal Block Patient position: sitting Prep: Betadine Patient monitoring: heart rate, cardiac monitor, continuous pulse ox and blood pressure Approach: midline Location: L3-4 Injection technique: single-shot Needle Needle type: Pencil-Tip  Needle gauge: 25 G Needle length: 9 cm Assessment Sensory level: T10     

## 2016-12-05 NOTE — Anesthesia Procedure Notes (Signed)
Date/Time: 12/05/2016 11:45 AM Performed by: Karoline Caldwell Pre-anesthesia Checklist: Patient identified, Emergency Drugs available, Suction available and Patient being monitored Patient Re-evaluated:Patient Re-evaluated prior to induction Oxygen Delivery Method: Simple face mask

## 2016-12-06 LAB — CBC
HEMATOCRIT: 35.4 % (ref 35.0–47.0)
Hemoglobin: 12 g/dL (ref 12.0–16.0)
MCH: 31 pg (ref 26.0–34.0)
MCHC: 34 g/dL (ref 32.0–36.0)
MCV: 91 fL (ref 80.0–100.0)
PLATELETS: 119 10*3/uL — AB (ref 150–440)
RBC: 3.89 MIL/uL (ref 3.80–5.20)
RDW: 12.9 % (ref 11.5–14.5)
WBC: 10.2 10*3/uL (ref 3.6–11.0)

## 2016-12-06 LAB — BASIC METABOLIC PANEL
ANION GAP: 5 (ref 5–15)
BUN: 22 mg/dL — AB (ref 6–20)
CHLORIDE: 105 mmol/L (ref 101–111)
CO2: 24 mmol/L (ref 22–32)
Calcium: 7.5 mg/dL — ABNORMAL LOW (ref 8.9–10.3)
Creatinine, Ser: 0.89 mg/dL (ref 0.44–1.00)
GFR calc Af Amer: >60 mL/min (ref >60)
GFR calc non Af Amer: 53 mL/min — ABNORMAL LOW (ref >60)
GLUCOSE: 90 mg/dL (ref 65–99)
POTASSIUM: 4.4 mmol/L (ref 3.5–5.1)
Sodium: 134 mmol/L — ABNORMAL LOW (ref 135–145)

## 2016-12-06 NOTE — Evaluation (Signed)
Physical Therapy Evaluation Patient Details Name: Yvonne Edwards MRN: [id number] DOB: 02-18-1919 Today's Date: 12/06/2016   History of Present Illness  Pt is a 83 y.o. female presenting to hospital s/p mechanical fall sustaining R femoral neck fx.  Pt s/p R THA anterior approach 12/05/16.  PMH includes HOH and macular degeneration.    Clinical Impression  Pt able to ambulate 3 ft to bedside chair, take a sitting rest break, and ambulate 3 ft back to EOB during session with CGA and RW for safety; pt's HR increased from 89 to 114 bpm during short ambulation and pt also demonstrated SOB; able to quickly return to baseline with vc's for deep breathing. Pt reports 2/10 R hip pain beginning and end of session; pt reports 6/10 R hip pain with activity (specifically supine to sit). Pt requires vc's for use of bed rails with bed mobility and B LE and B UE placement during transfers. Next session focus on continuing to improve functional mobility and increasing activity tolerance.    Follow Up Recommendations SNF    Equipment Recommendations  Rolling walker with 5" wheels    Recommendations for Other Services       Precautions / Restrictions Precautions Precautions: Anterior Hip;Fall Precaution Booklet Issued: Yes (comment) Restrictions Weight Bearing Restrictions: Yes RLE Weight Bearing: Weight bearing as tolerated      Mobility  Bed Mobility Overal bed mobility: Needs Assistance Bed Mobility: Supine to Sit     Supine to sit: Min assist;HOB elevated Sit to supine: Mod assist   General bed mobility comments: pt able to perform supine to sit with +1 min assist with HOB elevated; sit to supine and repositioning require +1 mod assist; vc's for use of bed rails  Transfers Overall transfer level: Needs assistance Equipment used: Rolling walker (2 wheeled) Transfers: Sit to/from Stand Sit to Stand: Min assist         General transfer comment: pt able to perform sit to stand with +1 min  assist for initial stand; vc's for B LE and B UE placement  Ambulation/Gait Ambulation/Gait assistance: Min guard Ambulation Distance (Feet):  (3+3) Assistive device: Rolling walker (2 wheeled)       General Gait Details: pt able to perform minimal ambulation to bedside chair, sit for a rest break, and back to EOB with RW and CGA for safety  Stairs            Wheelchair Mobility    Modified Rankin (Stroke Patients Only)       Balance Overall balance assessment: Needs assistance;History of Falls Sitting-balance support: Bilateral upper extremity supported;Feet unsupported Sitting balance-Leahy Scale: Poor Sitting balance - Comments: pt with one bout of loss of balance backwards requiring min assist to regain sitting balance    Standing balance support: Bilateral upper extremity supported Standing balance-Leahy Scale: Poor Standing balance comment: pt requires RW for static and dynamic standing balance                             Pertinent Vitals/Pain Pain Assessment: 0-10 Pain Score: 6  Pain Location: pt reports 2/10 R hip pain beginning and end of session; pt reports 6/10 R hip pain with activity (specifically supine to sit) Pain Descriptors / Indicators: Sore;Operative site guarding;Grimacing;Guarding Pain Intervention(s): Limited activity within patient's tolerance;Monitored during session;Repositioned HR - 89 to 114 bpm throughout session via pulse ox O2 - 90 to 96% on 2L  throughout session via pulse ox  Home Living       Type of Home: House Home Access: Stairs to enter Entrance Stairs-Rails: Can reach both Entrance Stairs-Number of Steps: 5 Home Layout: Two level;Able to live on main level with bedroom/bathroom        Prior Function                 Hand Dominance        Extremity/Trunk Assessment                Communication      Cognition Arousal/Alertness: Awake/alert Behavior During Therapy: WFL for tasks  assessed/performed Overall Cognitive Status: Within Functional Limits for tasks assessed                                        General Comments      Exercises Total Joint Exercises Ankle Circles/Pumps: AROM;Strengthening;Both;10 reps;Supine Quad Sets: AROM;Strengthening;Both;10 reps;Supine Gluteal Sets: AROM;Strengthening;Both;10 reps;Supine Towel Squeeze: AROM;Strengthening;Both;10 reps;Supine Long Arc Quad: AAROM;Strengthening;Right;10 reps;Seated   Assessment/Plan    PT Assessment    PT Problem List         PT Treatment Interventions      PT Goals (Current goals can be found in the Care Plan section)  Acute Rehab PT Goals Patient Stated Goal: to be able to walk again PT Goal Formulation: With patient Time For Goal Achievement: 12/19/16 Potential to Achieve Goals: Good    Frequency BID   Barriers to discharge        Co-evaluation               AM-PAC PT "6 Clicks" Daily Activity  Outcome Measure Difficulty turning over in bed (including adjusting bedclothes, sheets and blankets)?: Unable Difficulty moving from lying on back to sitting on the side of the bed? : Unable Difficulty sitting down on and standing up from a chair with arms (e.g., wheelchair, bedside commode, etc,.)?: Unable Help needed moving to and from a bed to chair (including a wheelchair)?: A Little Help needed walking in hospital room?: A Little Help needed climbing 3-5 steps with a railing? : Total 6 Click Score: 10    End of Session Equipment Utilized During Treatment: Gait belt;Oxygen Activity Tolerance: Patient limited by pain;Patient limited by fatigue Patient left: in bed;with call bell/phone within reach;with bed alarm set;with family/visitor present;with SCD's reapplied (B heels elevated via towel rolls; SCD's in place and activated) Nurse Communication: Mobility status PT Visit Diagnosis: Other abnormalities of gait and mobility (R26.89);Muscle weakness  (generalized) (M62.81);History of falling (Z91.81);Pain Pain - Right/Left: Right Pain - part of body: Hip    Time: 1610-9604 PT Time Calculation (min) (ACUTE ONLY): 29 min   Charges:         PT G CodesGwenlyn Found, SPT 19-Dec-2016,3:58 PM [phone]

## 2016-12-06 NOTE — Progress Notes (Signed)
PT is recommending SNF. Clinical Education officer, museum (CSW) met with patient and her friend/ [employer] [place] was at bedside. CSW presented bed offers. Patient chose [facility]. Patient prefers a private room but is okay with a semi-private room. [facility] admissions coordinator at [facility] is aware of accepted bed offer. CSW will continue to follow and assist as needed.   [employer], LCSW [phone]

## 2016-12-06 NOTE — Progress Notes (Signed)
[facility]   PATIENT NAME: Yvonne Edwards    MR#:  [id number]  DATE OF BIRTH:  11-10-1918  SUBJECTIVE:  CHIEF COMPLAINT:   Chief Complaint  Patient presents with  . Fall  The patient is 83 year old Caucasian female with no significant past medical history, who presented to the hospital with complaints of fall and right hip pain. X-ray in the hospital revealed right femoral neck fracture.. Patient denies any chest pains or shortness of breath on exertion, she was active prior to fall. EKG was normal. The patient underwent total hip arthroplasty, anterior approach on the right. Patient feels satisfactory today, less pain than yesterday. Denies any shortness of breath or chest pain     Review of Systems  Constitutional: Negative for chills, fever and weight loss.  HENT: Negative for congestion.   Eyes: Negative for blurred vision and double vision.  Respiratory: Negative for cough, sputum production, shortness of breath and wheezing.   Cardiovascular: Negative for chest pain, palpitations, orthopnea, leg swelling and PND.  Gastrointestinal: Negative for abdominal pain, blood in stool, constipation, diarrhea, nausea and vomiting.  Genitourinary: Negative for dysuria, frequency, hematuria and urgency.  Musculoskeletal: Positive for joint pain. Negative for falls.  Neurological: Negative for dizziness, tremors, focal weakness and headaches.  Endo/Heme/Allergies: Does not bruise/bleed easily.  Psychiatric/Behavioral: Negative for depression. The patient does not have insomnia.     VITAL SIGNS: Blood pressure (!) 136/52, pulse 96, temperature 98.9 F (37.2 C), temperature source Oral, resp. rate 16, height 5\' 3"  (1.6 m), weight 49 kg (108 lb), SpO2 93 %.  PHYSICAL EXAMINATION:   GENERAL:  83 y.o.-year-old patient lying in the bed , not in significant distress.  EYES: Pupils equal, round, reactive to light and accommodation. No scleral  icterus. Extraocular muscles intact.  HEENT: Head atraumatic, normocephalic. Oropharynx and nasopharynx clear.  NECK:  Supple, no jugular venous distention. No thyroid enlargement, no tenderness.  LUNGS: Normal breath sounds bilaterally, no wheezing, rales,rhonchi or crepitation. No use of accessory muscles of respiration.  CARDIOVASCULAR: S1, S2 normal. No murmurs, rubs, or gallops.  ABDOMEN: Soft, nontender, nondistended. Bowel sounds present. No organomegaly or mass.  EXTREMITIES: No pedal edema, cyanosis, or clubbing.  Right thigh/hip area incision has minimal dry blood, no wheezing, no significant swelling or tenderness on palpation. No swelling bilaterally in the calves  NEUROLOGIC: Cranial nerves II through XII are intact. Muscle strength 5/5 in all extremities. Sensation intact. Gait not checked.  PSYCHIATRIC: The patient is alert and oriented x 3.  SKIN: No obvious rash, lesion, or ulcer.   ORDERS/RESULTS REVIEWED:   CBC  Recent Labs Lab 12/04/16 1507 12/05/16 0306 12/06/16 0411  WBC 6.2 13.2* 10.2  HGB 14.1 13.6 12.0  HCT 42.3 40.3 35.4  PLT 154 143* 119*  MCV 89.1 90.1 91.0  MCH 29.7 30.3 31.0  MCHC 33.3 33.6 34.0  RDW 12.8 13.0 12.9  LYMPHSABS 1.1  --   --   MONOABS 0.5  --   --   EOSABS 0.3  --   --   BASOSABS 0.1  --   --    ------------------------------------------------------------------------------------------------------------------  Chemistries   Recent Labs Lab 12/04/16 1507 12/05/16 0306 12/06/16 0411  NA 138 137 134*  K 4.5 4.5 4.4  CL 104 104 105  CO2 27 28 24   GLUCOSE 128* 124* 90  BUN 19 16 22*  CREATININE 0.87 0.91 0.89  CALCIUM 8.8* 8.1* 7.5*   ------------------------------------------------------------------------------------------------------------------ estimated creatinine clearance  is 28.6 mL/min (by C-G formula based on SCr of 0.89  mg/dL). ------------------------------------------------------------------------------------------------------------------ No results for input(s): TSH, T4TOTAL, T3FREE, THYROIDAB in the last 72 hours.  Invalid input(s): FREET3  Cardiac Enzymes No results for input(s): CKMB, TROPONINI, MYOGLOBIN in the last 168 hours.  Invalid input(s): CK ------------------------------------------------------------------------------------------------------------------ Invalid input(s): POCBNP ---------------------------------------------------------------------------------------------------------------  RADIOLOGY: Dg Chest 1 View  Result Date: 12/04/2016 CLINICAL DATA:  Pain after fall. EXAM: CHEST 1 VIEW COMPARISON:  December 07, 2012 FINDINGS: Given the supine positioning, the cardiomediastinal silhouette is unchanged. No pneumothorax. No pulmonary nodules or masses. No focal infiltrates. IMPRESSION: No active disease. Electronically Signed   By: Gerome Sam III M.D   On: 12/04/2016 15:35   Dg Hip Operative Unilat W Or W/o Pelvis Right  Result Date: 12/05/2016 CLINICAL DATA:  Right hip replacement EXAM: OPERATIVE RIGHT HIP (WITH PELVIS IF PERFORMED) 3 VIEWS TECHNIQUE: Fluoroscopic spot image(s) were submitted for interpretation post-operatively. COMPARISON:  12/04/2016 FINDINGS: Multiple intraoperative spot images are submitted demonstrating changes of right hip replacement. Normal AP alignment. No visible hardware or bony complicating feature. IMPRESSION: Right hip replacement.  No visible complicating feature. Electronically Signed   By: Charlett Nose M.D.   On: 12/05/2016 13:09   Dg Hip Unilat W Or W/o Pelvis 2-3 Views Right  Result Date: 12/05/2016 CLINICAL DATA:  Status post right hip joint replacement. EXAM: DG HIP (WITH OR WITHOUT PELVIS) 2-3V RIGHT COMPARISON:  Preoperative study of December 04, 2016 FINDINGS: There has been interval placement of a prosthetic right hip joint. The interface with the  native bone appears normal. No acute native bone abnormality is observed. There are vascular calcifications. There is soft tissue gas present at the surgical site. Surgical skin staples are present. IMPRESSION: No immediate postprocedure complication following right hip joint prosthesis placement. Electronically Signed   By: David  [place] M.D.   On: 12/05/2016 14:07   Dg Hip Unilat With Pelvis 2-3 Views Right  Result Date: 12/04/2016 CLINICAL DATA:  Pain after fall. EXAM: DG HIP (WITH OR WITHOUT PELVIS) 2-3V RIGHT COMPARISON:  None. FINDINGS: There is a fracture through the right femoral neck with mild displacement. No evidence of femoral head dislocation. No other fractures are identified. IMPRESSION: Fracture through the right femoral neck. Electronically Signed   By: Gerome Sam III M.D   On: 12/04/2016 15:34    EKG:  Orders placed or performed in visit on 12/08/12  . EKG 12-Lead  . EKG 12-Lead  . EKG 12-Lead    ASSESSMENT AND PLAN:  Active Problems:   Hip fracture (HCC)  #1 right femoral neck fracture, closed, Status post  right hiparthroplasty, anterior approach on the right by Dr. Rosita Kea 12/05/2016, continue pain medications, anticoagulation with Lovenox daily. Physical therapy as per ortho surgery,  skilled nursing facility for rehabilitation was recommended by physical therapist  #2. Hyperglycemia, hemoglobin A1c was 5.9, no diabetes #3. Marland Kitchen Thrombocytopenia, likely consumption, following closely  #4. Leukocytosis, likely due to stress, chest x-ray was unremarkable, as well as urinalysis , normalized  #5. MRSA carrier, continue mupirocin intranasally for 5 days total    Management plans discussed with the patient, family and they are in agreement.   DRUG ALLERGIES:  Allergies  Allergen Reactions  . Codeine Other (See Comments)  . Shellfish Allergy Nausea And Vomiting    CODE STATUS:     Code Status Orders        Start     Ordered   12/04/16 1741  Full code   Continuous  12/04/16 1740    Code Status History    Date Active Date Inactive Code Status Order ID Comments User Context   This patient has a current code status but no historical code status.    Advance Directive Documentation     Most Recent Value  Type of Advance Directive  Healthcare Power of Attorney  Pre-existing out of facility DNR order (yellow form or pink MOST form)  -  "MOST" Form in Place?  -      TOTAL TIME TAKING CARE OF THIS PATIENT 30  minutes.    Katharina Caper M.D on 12/06/2016 at 1:34 PM  Between 7am to 6pm - Pager - [phone]  After 6pm go to [email or web address] - password [email or web address] [facility]  [facility] [facility]  Office  [phone]  CC: Primary care physician; [name], MD

## 2016-12-06 NOTE — Clinical Social Work Placement (Signed)
   CLINICAL SOCIAL WORK PLACEMENT  NOTE  Date:  12/06/2016  Patient Details  Name: Yvonne Edwards MRN: [id number] Date of Birth: March 02, 1919  Clinical Social Work is seeking post-discharge placement for this patient at the Skilled  Nursing Facility level of care (*CSW will initial, date and re-position this form in  chart as items are completed):  Yes   Patient/family provided with [place] Clinical Social Work Department's list of facilities offering this level of care within the geographic area requested by the patient (or if unable, by the patient's family).  Yes   Patient/family informed of their freedom to choose among providers that offer the needed level of care, that participate in Medicare, Medicaid or managed care program needed by the patient, have an available bed and are willing to accept the patient.  Yes   Patient/family informed of [facility]'s ownership interest in [facility] and [facility], as well as of the fact that they are under no obligation to receive care at these facilities.  PASRR submitted to EDS on       PASRR number received on       Existing PASRR number confirmed on 12/04/16     FL2 transmitted to all facilities in geographic area requested by pt/family on 12/04/16     FL2 transmitted to all facilities within larger geographic area on       Patient informed that his/her managed care company has contracts with or will negotiate with certain facilities, including the following:        Yes   Patient/family informed of bed offers received.  Patient chooses bed at  [facility] )     Physician recommends and patient chooses bed at      Patient to be transferred to   on  .  Patient to be transferred to facility by       Patient family notified on   of transfer.  Name of family member notified:        PHYSICIAN       Additional Comment:    _______________________________________________ Keisuke Hollabaugh, Darleen Crocker, LCSW 12/06/2016, 10:52  AM

## 2016-12-06 NOTE — Progress Notes (Signed)
Physical Therapy Treatment Patient Details Name: Yvonne Edwards MRN: [id number] DOB: 02/11/1919 Today's Date: 12/06/2016    History of Present Illness Pt is a 83 y.o. female presenting to hospital s/p mechanical fall sustaining R femoral neck fx.  Pt s/p R THA anterior approach 12/05/16.  PMH includes HOH and macular degeneration.    PT Comments    Pt able to perform sit to stand with +1 min assist and ambulate 3 ft to bedside chair with CGA and RW for safety. Pt reports 4/10 R hip pain beginning of session; 8/10 R hip pain with activity (spiking to 10/10 R hip in attempt to perform supine to sit); 5/10 R hip pain end of session in bedside chair. Pt requires +2 mod assist for bed mobility due to significant increase in pain; pt requires vc's for B LE and B UE placement during sit to stand. Next session focus on continuing to improve functional mobility with decreased pain.   Follow Up Recommendations  SNF     Equipment Recommendations  Rolling walker with 5" wheels    Recommendations for Other Services       Precautions / Restrictions Precautions Precautions: Anterior Hip;Fall Precaution Booklet Issued: Yes (comment) Restrictions Weight Bearing Restrictions: Yes RLE Weight Bearing: Weight bearing as tolerated    Mobility  Bed Mobility Overal bed mobility: Needs Assistance Bed Mobility: Supine to Sit     Supine to sit: +2 for physical assistance;Mod assist     General bed mobility comments: pt attempted supine to sit with +1 but unable due to significant increased R hip pain; +2 mod assist to achieve supine to sit   Transfers Overall transfer level: Needs assistance Equipment used: Rolling walker (2 wheeled) Transfers: Sit to/from Stand Sit to Stand: Min assist         General transfer comment: pt able to perform sit to stand with +1 min assist for initial stand; vc's for B LE and B UE placement  Ambulation/Gait   Ambulation Distance (Feet): 3 Feet Assistive  device: Rolling walker (2 wheeled)       General Gait Details: pt able to perform minimal ambulation to bedside chair with RW and CGA for safety   Stairs            Wheelchair Mobility    Modified Rankin (Stroke Patients Only)       Balance Overall balance assessment: Needs assistance;History of Falls Sitting-balance support: Bilateral upper extremity supported;Feet unsupported Sitting balance-Leahy Scale: Poor     Standing balance support: Bilateral upper extremity supported Standing balance-Leahy Scale: Poor Standing balance comment: pt requires RW to stand with shakiness/unsteadiness noted upon initial stand                            Cognition Arousal/Alertness: Awake/alert Behavior During Therapy: WFL for tasks assessed/performed Overall Cognitive Status: Within Functional Limits for tasks assessed                                        Exercises Total Joint Exercises Ankle Circles/Pumps: AROM;Strengthening;Both;10 reps;Supine Quad Sets: AROM;Strengthening;Both;10 reps;Supine Gluteal Sets: AROM;Strengthening;Both;10 reps;Supine Towel Squeeze: AROM;Strengthening;Both;10 reps;Supine Short Arc Quad: AROM;Strengthening;Right;10 reps;Supine Heel Slides: AAROM;Strengthening;Right;10 reps;Supine Hip ABduction/ADduction: AAROM;Strengthening;Right;10 reps;Supine    General Comments        Pertinent Vitals/Pain Pain Assessment: 0-10 Pain Score: 8  Pain Location: pt reports 4/10 R  hip pain beginning of session; 8/10 R hip pain with activity (spiking to 10/10 R hip in attempt to perform supine to sit); 5/10 R hip pain end of session in bedside chair Pain Descriptors / Indicators: Sore;Operative site guarding;Grimacing;Guarding Pain Intervention(s): Limited activity within patient's tolerance;Monitored during session;Premedicated before session;Repositioned HR - 98 to 112 bpm throughout session via pulse ox O2 - 90 to 96% on 2L [place]  throughout session via pulse ox    Home Living       Type of Home: House Home Access: Stairs to enter Entrance Stairs-Rails: Can reach both Home Layout: Two level;Able to live on main level with bedroom/bathroom        Prior Function            PT Goals (current goals can now be found in the care plan section) Acute Rehab PT Goals Patient Stated Goal: to be able to walk again PT Goal Formulation: With patient Time For Goal Achievement: 12/19/16 Potential to Achieve Goals: Good Progress towards PT goals: Progressing toward goals    Frequency    BID      PT Plan Current plan remains appropriate    Co-evaluation              AM-PAC PT "6 Clicks" Daily Activity  Outcome Measure  Difficulty turning over in bed (including adjusting bedclothes, sheets and blankets)?: Unable Difficulty moving from lying on back to sitting on the side of the bed? : Unable Difficulty sitting down on and standing up from a chair with arms (e.g., wheelchair, bedside commode, etc,.)?: Unable Help needed moving to and from a bed to chair (including a wheelchair)?: A Little Help needed walking in hospital room?: A Little Help needed climbing 3-5 steps with a railing? : Total 6 Click Score: 10    End of Session Equipment Utilized During Treatment: Gait belt;Oxygen Activity Tolerance: Patient limited by pain Patient left: in chair;with call bell/phone within reach;with chair alarm set;with family/visitor present;with SCD's reapplied (B heels elevated via towel rolls; SCD's in place and activated) Nurse Communication: Mobility status;Precautions PT Visit Diagnosis: Other abnormalities of gait and mobility (R26.89);Muscle weakness (generalized) (M62.81);History of falling (Z91.81);Pain Pain - Right/Left: Right Pain - part of body: Hip     Time: 1002-1040 PT Time Calculation (min) (ACUTE ONLY): 38 min  Charges:                       G CodesGwenlyn Found, SPT 12-15-16,1:53  PM [phone]

## 2016-12-06 NOTE — Progress Notes (Signed)
   Subjective: 1 Day Post-Op Procedure(s) (LRB): TOTAL HIP ARTHROPLASTY ANTERIOR APPROACH (Right) Patient reports pain as mild and moderate with movement.   Patient is well, and has had no acute complaints or problems Denies any CP, SOB, ABD pain. We will continue therapy today.  Plan is to go Skilled nursing facility after hospital stay.  Objective: Vital signs in last 24 hours: Temp:  [97.3 F (36.3 C)-99.5 F (37.5 C)] 98.9 F (37.2 C) (08/21 0855) Pulse Rate:  [78-97] 96 (08/21 0855) Resp:  [16-23] 16 (08/21 0855) BP: (103-136)/(45-75) 136/52 (08/21 0855) SpO2:  [91 %-98 %] 93 % (08/21 0855)  Intake/Output from previous day: 08/20 0701 - 08/21 0700 In: 1876.7 [I.V.:1876.7] Out: 1100 [Urine:950; Blood:150] Intake/Output this shift: No intake/output data recorded.   Recent Labs  12/04/16 1507 12/05/16 0306 12/06/16 0411  HGB 14.1 13.6 12.0    Recent Labs  12/05/16 0306 12/06/16 0411  WBC 13.2* 10.2  RBC 4.47 3.89  HCT 40.3 35.4  PLT 143* 119*    Recent Labs  12/05/16 0306 12/06/16 0411  NA 137 134*  K 4.5 4.4  CL 104 105  CO2 28 24  BUN 16 22*  CREATININE 0.91 0.89  GLUCOSE 124* 90  CALCIUM 8.1* 7.5*    Recent Labs  12/04/16 1540  INR 0.97    EXAM General - Patient is Alert, Appropriate and Oriented Extremity - Neurovascular intact Sensation intact distally Intact pulses distally Dorsiflexion/Plantar flexion intact Incision: dressing C/D/I and scant drainage No cellulitis present Compartment soft Dressing - dressing C/D/I, no drainage and scant drainage Motor Function - intact, moving foot and toes well on exam.   Past Medical History:  Diagnosis Date  . Hyperlipidemia     Assessment/Plan:   1 Day Post-Op Procedure(s) (LRB): TOTAL HIP ARTHROPLASTY ANTERIOR APPROACH (Right) Active Problems:   Hip fracture (HCC)  Estimated body mass index is 19.13 kg/m as calculated from the following:   Height as of this encounter: 5\' 3"   (1.6 m).   Weight as of this encounter: 49 kg (108 lb). Advance diet Up with therapy  Needs BM Labs stable, Hgb 12.0 Plan on discharge to SNF Thursday Follow up with [facility] ortho in 2 weeks for staple removal TED hose BLE x 6 weeks, remove at night time Lovenox 40 mg daily x 14 days at discharge   DVT Prophylaxis - Lovenox, Foot Pumps and TED hose Weight-Bearing as tolerated to right leg   T. Cranston Neighbor, PA-C [facility] 12/06/2016, 1:34 PM

## 2016-12-06 NOTE — Anesthesia Postprocedure Evaluation (Signed)
Anesthesia Post Note  Patient: Yvonne Edwards  Procedure(s) Performed: Procedure(s) (LRB): TOTAL HIP ARTHROPLASTY ANTERIOR APPROACH (Right)  Patient location during evaluation: Nursing Unit Anesthesia Type: Spinal Level of consciousness: oriented and awake and alert Pain management: pain level controlled Vital Signs Assessment: post-procedure vital signs reviewed and stable Respiratory status: spontaneous breathing, respiratory function stable and patient connected to nasal cannula oxygen Cardiovascular status: blood pressure returned to baseline and stable Postop Assessment: no headache and no backache Anesthetic complications: no     Last Vitals:  Vitals:   12/06/16 0000 12/06/16 0402  BP: (!) 121/47 (!) 121/45  Pulse: 79 78  Resp: 18   Temp: (!) 36.3 C 36.9 C  SpO2: 98% 98%    Last Pain:  Vitals:   12/06/16 0402  TempSrc: Oral  PainSc:                  Jules Schick

## 2016-12-07 ENCOUNTER — Encounter
Admission: RE | Admit: 2016-12-07 | Discharge: 2016-12-07 | Disposition: A | Discharge status: Home / Self Care | Payer: Medicare Other | Source: Ambulatory Visit | Attending: Internal Medicine | Admitting: Internal Medicine

## 2016-12-07 DIAGNOSIS — D72829 Elevated white blood cell count, unspecified: Secondary | ICD-10-CM

## 2016-12-07 DIAGNOSIS — E871 Hypo-osmolality and hyponatremia: Secondary | ICD-10-CM

## 2016-12-07 DIAGNOSIS — Z22322 Carrier or suspected carrier of Methicillin resistant Staphylococcus aureus: Secondary | ICD-10-CM

## 2016-12-07 DIAGNOSIS — E559 Vitamin D deficiency, unspecified: Secondary | ICD-10-CM

## 2016-12-07 DIAGNOSIS — R739 Hyperglycemia, unspecified: Secondary | ICD-10-CM

## 2016-12-07 DIAGNOSIS — Z96649 Presence of unspecified artificial hip joint: Secondary | ICD-10-CM

## 2016-12-07 DIAGNOSIS — D696 Thrombocytopenia, unspecified: Secondary | ICD-10-CM

## 2016-12-07 LAB — SODIUM: SODIUM: 134 mmol/L — AB (ref 135–145)

## 2016-12-07 LAB — VITAMIN D 25 HYDROXY (VIT D DEFICIENCY, FRACTURES): VIT D 25 HYDROXY: 10.6 ng/mL — AB (ref 30.0–100.0)

## 2016-12-07 LAB — SURGICAL PATHOLOGY

## 2016-12-07 LAB — PLATELET COUNT: PLATELETS: 132 10*3/uL — AB (ref 150–440)

## 2016-12-07 MED ORDER — DOCUSATE SODIUM 100 MG PO CAPS: 100.0000 mg | ORAL_CAPSULE | Freq: Two times a day (BID) | ORAL | 0 refills | Status: DC

## 2016-12-07 MED ORDER — CALCIUM CARBONATE-VITAMIN D 500-200 MG-UNIT PO TABS: 1.0000 | ORAL_TABLET | Freq: Two times a day (BID) | ORAL | 3 refills | Status: DC

## 2016-12-07 MED ORDER — ACETAMINOPHEN 325 MG PO TABS: 650.0000 mg | ORAL_TABLET | Freq: Four times a day (QID) | ORAL | 3 refills | Status: DC | PRN

## 2016-12-07 MED ORDER — MUPIROCIN 2 % EX OINT: 1.0000 "application " | TOPICAL_OINTMENT | Freq: Two times a day (BID) | CUTANEOUS | 0 refills | Status: DC

## 2016-12-07 MED ORDER — VITAMIN D (ERGOCALCIFEROL) 1.25 MG (50000 UNIT) PO CAPS: 50000.0000 [IU] | ORAL_CAPSULE | ORAL | 3 refills | Status: DC

## 2016-12-07 MED ORDER — HYDROCODONE-ACETAMINOPHEN 5-325 MG PO TABS: 1.0000 | ORAL_TABLET | ORAL | 0 refills | Status: DC | PRN

## 2016-12-07 MED ORDER — ENOXAPARIN SODIUM 30 MG/0.3ML ~~LOC~~ SOLN: 30.0000 mg | SUBCUTANEOUS | 0 refills | Status: DC

## 2016-12-07 MED ORDER — METHOCARBAMOL 500 MG PO TABS: 500.0000 mg | ORAL_TABLET | Freq: Four times a day (QID) | ORAL | 2 refills | Status: DC | PRN

## 2016-12-07 MED ORDER — SENNA 8.6 MG PO TABS: 1.0000 | ORAL_TABLET | Freq: Every day | ORAL | 0 refills | Status: DC

## 2016-12-07 NOTE — Progress Notes (Signed)
   Subjective: 2 Days Post-Op Procedure(s) (LRB): TOTAL HIP ARTHROPLASTY ANTERIOR APPROACH (Right) Patient reports pain as mild.   Patient is well, and has had no acute complaints or problems Denies any CP, SOB, ABD pain. We will continue therapy today.  Plan is to go Skilled nursing facility after hospital stay.  Objective: Vital signs in last 24 hours: Temp:  [97.7 F (36.5 C)-98.9 F (37.2 C)] 97.8 F (36.6 C) (08/22 0741) Pulse Rate:  [90-96] 90 (08/22 0741) Resp:  [16-19] 16 (08/22 0741) BP: (130-141)/(52-63) 141/63 (08/22 0741) SpO2:  [93 %-98 %] 98 % (08/22 0741)  Intake/Output from previous day: 08/21 0701 - 08/22 0700 In: 120 [I.V.:120] Out: -  Intake/Output this shift: No intake/output data recorded.   Recent Labs  12/04/16 1507 12/05/16 0306 12/06/16 0411  HGB 14.1 13.6 12.0    Recent Labs  12/05/16 0306 12/06/16 0411 12/07/16 0402  WBC 13.2* 10.2  --   RBC 4.47 3.89  --   HCT 40.3 35.4  --   PLT 143* 119* 132*    Recent Labs  12/05/16 0306 12/06/16 0411 12/07/16 0402  NA 137 134* 134*  K 4.5 4.4  --   CL 104 105  --   CO2 28 24  --   BUN 16 22*  --   CREATININE 0.91 0.89  --   GLUCOSE 124* 90  --   CALCIUM 8.1* 7.5*  --     Recent Labs  12/04/16 1540  INR 0.97    EXAM General - Patient is Alert, Appropriate and Oriented Extremity - Neurovascular intact Sensation intact distally Intact pulses distally Dorsiflexion/Plantar flexion intact Incision: dressing C/D/I and scant drainage No cellulitis present Compartment soft Dressing - dressing C/D/I, no drainage and scant drainage Motor Function - intact, moving foot and toes well on exam.   Past Medical History:  Diagnosis Date  . Hyperlipidemia     Assessment/Plan:   2 Days Post-Op Procedure(s) (LRB): TOTAL HIP ARTHROPLASTY ANTERIOR APPROACH (Right) Active Problems:   Hip fracture (HCC)  Estimated body mass index is 19.13 kg/m as calculated from the following:  Height as of this encounter: 5\' 3"  (1.6 m).   Weight as of this encounter: 49 kg (108 lb). Advance diet Up with therapy  Needs BM Labs stable, Hgb 12.0 Plan on discharge to SNF today Follow up with [facility] ortho in 2 weeks for staple removal TED hose BLE x 6 weeks, remove at night time Lovenox 40 mg daily x 14 days at discharge   DVT Prophylaxis - Lovenox, Foot Pumps and TED hose Weight-Bearing as tolerated to right leg   T. Cranston Neighbor, PA-C [facility] 12/07/2016, 8:25 AM

## 2016-12-07 NOTE — Discharge Summary (Addendum)
[facility] [place] [facility]   PATIENT NAME: Yvonne Edwards    MR#:  [id number]  DATE OF BIRTH:  May 23, 1918  DATE OF ADMISSION:  12/04/2016 ADMITTING PHYSICIAN: Gracelyn Nurse, MD  DATE OF DISCHARGE: No discharge date for patient encounter.  PRIMARY CARE PHYSICIAN: [name], MD     ADMISSION DIAGNOSIS:  Closed right hip fracture, initial encounter (HCC) [S72.001A]  DISCHARGE DIAGNOSIS:  Active Problems:   Hip fracture (HCC)   S/P total hip arthroplasty   Hyponatremia   Hyperglycemia   Thrombocytopenia (HCC)   Leukocytosis   MRSA carrier   Vitamin D deficiency   SECONDARY DIAGNOSIS:   Past Medical History:  Diagnosis Date  . Hyperlipidemia     .pro HOSPITAL COURSE:   The patient is 83 year old Caucasian female with no significant past medical history, who presented to the hospital with complaints of fall and right hip pain. X-ray in the hospital revealed right femoral neck fracture.. Patient denies any chest pains or shortness of breath on exertion, she was active prior to fall. EKG was normal. The patient underwent total hip arthroplasty, anterior approach on the right 12/05/2016 by Dr. Rosita Kea. Postoperatively the patient did well, participating in physical therapy, which recommended skilled nursing facility placement for rehabilitation. Patient is able to be discharged to skilled nursing facility for rehabilitation. Today. Patient feels satisfactory today, less pain than yesterday. Denies any shortness of breath or chest pain Discussion by problem: 1 right femoral neck fracture, closed, Status post  right hiparthroplasty, anterior approach on the right by Dr. Rosita Kea 12/05/2016, continue pain medications, anticoagulation with Lovenox daily for 14 days. Physical therapy recommended  skilled nursing facility for rehabilitation, to be discharged today. Hemoglobin level remained stable at 12.0 on the day of discharge. Discussed with Dr.  Rosita Kea and his PA, Mr. Floyce Stakes today.  #2. Hyperglycemia, hemoglobin A1c was 5.9, no diabetes #3. Marland Kitchen Thrombocytopenia, likely consumption, improved, following as outpatient #4. Leukocytosis, likely due to stress, chest x-ray was unremarkable, as well as urinalysis , normalized  #5. MRSA carrier, continue mupirocin intranasally for 5 days total  #6. Hyponatremia, likely mild dehydration, advance oral intake, follow sodium level as outpatient #7, mild constipation, continue Colace, Senokot, patient will be given a suppository to produce stool prior to discharge #8. Vitamin D deficiency with hypocalcemia, vitamin D level was found to be 10.6, initiate patient on vitamin D and calcium supplementation, follow vitamin D levels as outpatient.  DISCHARGE CONDITIONS:   Stable  CONSULTS OBTAINED:  Treatment Team:  Kennedy Bucker, MD  DRUG ALLERGIES:   Allergies  Allergen Reactions  . Codeine Other (See Comments)  . Shellfish Allergy Nausea And Vomiting    DISCHARGE MEDICATIONS:   Current Discharge Medication List    START taking these medications   Details  acetaminophen (TYLENOL) 325 MG tablet Take 2 tablets (650 mg total) by mouth every 6 (six) hours as needed for mild pain (or Fever >/= 101). Qty: 60 tablet, Refills: 3    calcium-vitamin D (OSCAL 500/200 D-3) 500-200 MG-UNIT tablet Take 1 tablet by mouth 2 (two) times daily. Qty: 180 tablet, Refills: 3    docusate sodium (COLACE) 100 MG capsule Take 1 capsule (100 mg total) by mouth 2 (two) times daily. Qty: 10 capsule, Refills: 0    enoxaparin (LOVENOX) 30 MG/0.3ML injection Inject 0.3 mLs (30 mg total) into the skin daily. Qty: 14 Syringe, Refills: 0    HYDROcodone-acetaminophen (NORCO/VICODIN) 5-325 MG tablet Take 1-2 tablets  by mouth every 4 (four) hours as needed (breakthrough pain). Qty: 30 tablet, Refills: 0    methocarbamol (ROBAXIN) 500 MG tablet Take 1 tablet (500 mg total) by mouth every 6 (six) hours as needed for muscle  spasms. Qty: 60 tablet, Refills: 2    mupirocin ointment (BACTROBAN) 2 % Place 1 application into the nose 2 (two) times daily. Qty: 22 g, Refills: 0    senna (SENOKOT) 8.6 MG TABS tablet Take 1 tablet (8.6 mg total) by mouth daily. Qty: 120 each, Refills: 0    Vitamin D, Ergocalciferol, (DRISDOL) 50000 units CAPS capsule Take 1 capsule (50,000 Units total) by mouth every 7 (seven) days. Qty: 10 capsule, Refills: 3      CONTINUE these medications which have NOT CHANGED   Details  Multiple Vitamins-Minerals (PRESERVISION AREDS 2 PO) Take 1 capsule by mouth daily.     naproxen sodium (ANAPROX) 220 MG tablet Take 220 mg by mouth daily as needed.     rosuvastatin (CRESTOR) 10 MG tablet Take 10 mg by mouth daily. Refills: 1         DISCHARGE INSTRUCTIONS:    The patient is to follow-up with primary care physician and orthopedic surgeon within 1-2 weeks after discharge  If you experience worsening of your admission symptoms, develop shortness of breath, life threatening emergency, suicidal or homicidal thoughts you must seek medical attention immediately by calling 911 or calling your MD immediately  if symptoms less severe.  You Must read complete instructions/literature along with all the possible adverse reactions/side effects for all the Medicines you take and that have been prescribed to you. Take any new Medicines after you have completely understood and accept all the possible adverse reactions/side effects.   Please note  You were cared for by a hospitalist during your hospital stay. If you have any questions about your discharge medications or the care you received while you were in the hospital after you are discharged, you can call the unit and asked to speak with the hospitalist on call if the hospitalist that took care of you is not available. Once you are discharged, your primary care physician will handle any further medical issues. Please note that NO REFILLS for any  discharge medications will be authorized once you are discharged, as it is imperative that you return to your primary care physician (or establish a relationship with a primary care physician if you do not have one) for your aftercare needs so that they can reassess your need for medications and monitor your lab values.    Today   CHIEF COMPLAINT:   Chief Complaint  Patient presents with  . Fall    HISTORY OF PRESENT ILLNESS:     VITAL SIGNS:  Blood pressure (!) 141/63, pulse 90, temperature 97.8 F (36.6 C), temperature source Axillary, resp. rate 16, height 5\' 3"  (1.6 m), weight 49 kg (108 lb), SpO2 98 %.  I/O:    Intake/Output Summary (Last 24 hours) at 12/07/16 0843 Last data filed at 12/06/16 1308  Gross per 24 hour  Intake              120 ml  Output                0 ml  Net              120 ml    PHYSICAL EXAMINATION:  GENERAL:  83 y.o.-year-old patient lying in the bed with no acute distress.  EYES: Pupils equal,  round, reactive to light and accommodation. No scleral icterus. Extraocular muscles intact.  HEENT: Head atraumatic, normocephalic. Oropharynx and nasopharynx clear.  NECK:  Supple, no jugular venous distention. No thyroid enlargement, no tenderness.  LUNGS: Normal breath sounds bilaterally, no wheezing, rales,rhonchi or crepitation. No use of accessory muscles of respiration.  CARDIOVASCULAR: S1, S2 normal. No murmurs, rubs, or gallops.  ABDOMEN: Soft, non-tender, non-distended. Bowel sounds present. No organomegaly or mass.  EXTREMITIES: No pedal edema, cyanosis, or clubbing. Right hip area. Incision with some dried blood on the dressing, no fresh bleeding , mild swelling, so no significant tenderness. Some bruising NEUROLOGIC: Cranial nerves II through XII are intact. Muscle strength 5/5 in all extremities. Sensation intact. Gait not checked.  PSYCHIATRIC: The patient is alert and oriented x 3.  SKIN: No obvious rash, lesion, or ulcer.   DATA REVIEW:     CBC  Recent Labs Lab 12/06/16 0411 12/07/16 0402  WBC 10.2  --   HGB 12.0  --   HCT 35.4  --   PLT 119* 132*    Chemistries   Recent Labs Lab 12/06/16 0411 12/07/16 0402  NA 134* 134*  K 4.4  --   CL 105  --   CO2 24  --   GLUCOSE 90  --   BUN 22*  --   CREATININE 0.89  --   CALCIUM 7.5*  --     Cardiac Enzymes No results for input(s): TROPONINI in the last 168 hours.  Microbiology Results  Results for orders placed or performed during the hospital encounter of 12/04/16  Surgical PCR screen     Status: Abnormal   Collection Time: 12/05/16  4:01 AM  Result Value Ref Range Status   MRSA, PCR POSITIVE (A) NEGATIVE Final    Comment: RESULT CALLED TO, READ BACK BY AND VERIFIED WITH:  Eartha Inch AT 3086 12/05/16 SDR    Staphylococcus aureus POSITIVE (A) NEGATIVE Final    Comment:        The Xpert SA Assay (FDA approved for NASAL specimens in patients over 26 years of age), is one component of a comprehensive surveillance program.  Test performance has been validated by [facility] for patients greater than or equal to 32 year old. It is not intended to diagnose infection nor to guide or monitor treatment.     RADIOLOGY:  Dg Hip Operative Unilat W Or W/o Pelvis Right  Result Date: 12/05/2016 CLINICAL DATA:  Right hip replacement EXAM: OPERATIVE RIGHT HIP (WITH PELVIS IF PERFORMED) 3 VIEWS TECHNIQUE: Fluoroscopic spot image(s) were submitted for interpretation post-operatively. COMPARISON:  12/04/2016 FINDINGS: Multiple intraoperative spot images are submitted demonstrating changes of right hip replacement. Normal AP alignment. No visible hardware or bony complicating feature. IMPRESSION: Right hip replacement.  No visible complicating feature. Electronically Signed   By: Charlett Nose M.D.   On: 12/05/2016 13:09   Dg Hip Unilat W Or W/o Pelvis 2-3 Views Right  Result Date: 12/05/2016 CLINICAL DATA:  Status post right hip joint replacement. EXAM: DG HIP  (WITH OR WITHOUT PELVIS) 2-3V RIGHT COMPARISON:  Preoperative study of December 04, 2016 FINDINGS: There has been interval placement of a prosthetic right hip joint. The interface with the native bone appears normal. No acute native bone abnormality is observed. There are vascular calcifications. There is soft tissue gas present at the surgical site. Surgical skin staples are present. IMPRESSION: No immediate postprocedure complication following right hip joint prosthesis placement. Electronically Signed   By: David  [place]  M.D.   On: 12/05/2016 14:07    EKG:   Orders placed or performed in visit on 12/08/12  . EKG 12-Lead  . EKG 12-Lead  . EKG 12-Lead      Management plans discussed with the patient, family and they are in agreement.  CODE STATUS:     Code Status Orders        Start     Ordered   12/04/16 1741  Full code  Continuous     12/04/16 1740    Code Status History    Date Active Date Inactive Code Status Order ID Comments User Context   This patient has a current code status but no historical code status.    Advance Directive Documentation     Most Recent Value  Type of Advance Directive  Healthcare Power of Attorney  Pre-existing out of facility DNR order (yellow form or pink MOST form)  -  "MOST" Form in Place?  -      TOTAL TIME TAKING CARE OF THIS PATIENT: 40 minutes.    Katharina Caper M.D on 12/07/2016 at 8:43 AM  Between 7am to 6pm - Pager - [phone]  After 6pm go to [email or web address] - password [email or web address] [facility]  [facility]  Office  [phone]  CC: Primary care physician; [name], MD

## 2016-12-07 NOTE — Progress Notes (Signed)
Report called to [facility]. Report given to [facility]. EMS called for transportation. Printed prescription for [employer] and printed AVS in transfer package. Honeycomb dressing changed.   Stephannie Peters, RN

## 2016-12-07 NOTE — Progress Notes (Signed)
Physical Therapy Treatment Patient Details Name: Yvonne Edwards MRN: [id number] DOB: [birth year] Today's Date: 12/07/2016    History of Present Illness Pt is a 83 y.o. female presenting to hospital s/p mechanical fall sustaining R femoral neck fx.  Pt s/p R THA anterior approach 12/05/16.  PMH includes HOH and macular degeneration.    PT Comments    Upon initial PT arrival pt reports significant urge for bowel movement; pt requests female assistance for use of BSC; nurse tech called in to assist with use of BSC; PT received pt on EOB and she reports feeling "weak and worn out" from getting to [facility] and bowel movement. Pt requests to return to supine in bed; pt requires mod assist for B LE for sit to supine; pt able to perform bed exercises. Pt reports 4/10 R hip pain beginning and end of session; pt reports 6/10 R hip pain with activity. Next session focus on activity tolerance and improving functional mobility.   Follow Up Recommendations  SNF     Equipment Recommendations  Rolling walker with 5" wheels    Recommendations for Other Services       Precautions / Restrictions Precautions Precautions: Anterior Hip;Fall Precaution Booklet Issued: Yes (comment) Restrictions Weight Bearing Restrictions: Yes RLE Weight Bearing: Weight bearing as tolerated    Mobility  Bed Mobility Overal bed mobility: Needs Assistance Bed Mobility: Sit to Supine       Sit to supine: Mod assist   General bed mobility comments: pt able to perform sit to supine with +1 mod assist for B LE; vc's for use of bed rails for repositioning  Transfers                    Ambulation/Gait                 Stairs            Wheelchair Mobility    Modified Rankin (Stroke Patients Only)       Balance Overall balance assessment: Needs assistance;History of Falls Sitting-balance support: No upper extremity supported;Feet supported Sitting balance-Leahy Scale: Fair Sitting balance -  Comments: pt able to maintain static sitting balance with no overt loss of balance noted                                    Cognition Arousal/Alertness: Awake/alert Behavior During Therapy: WFL for tasks assessed/performed Overall Cognitive Status: Within Functional Limits for tasks assessed                                        Exercises Total Joint Exercises Ankle Circles/Pumps: AROM;Strengthening;Both;10 reps;Supine Quad Sets: AROM;Strengthening;Both;10 reps;Supine Gluteal Sets: AROM;Strengthening;Both;10 reps;Supine Towel Squeeze: AROM;Strengthening;Both;10 reps;Supine Short Arc Quad: AROM;Strengthening;Right;10 reps;Supine Heel Slides: AAROM;Strengthening;Right;10 reps;Supine Hip ABduction/ADduction: AAROM;Strengthening;Right;10 reps;Supine    General Comments        Pertinent Vitals/Pain Pain Assessment: 0-10 Pain Score: 6  Pain Location: pt reports 4/10 R hip pain beginning and end of session; pt reports 6/10 R hip pain with activity Pain Descriptors / Indicators: Sore;Operative site guarding;Grimacing;Guarding Pain Intervention(s): Limited activity within patient's tolerance;Monitored during session;Repositioned HR - 88 to 112 bpm throughout session via pulse ox O2 - 90 to 95% on 1L [place] throughout session via pulse ox    Home Living  Prior Function            PT Goals (current goals can now be found in the care plan section) Acute Rehab PT Goals Patient Stated Goal: to be able to walk again PT Goal Formulation: With patient Time For Goal Achievement: 12/19/16 Potential to Achieve Goals: Good Progress towards PT goals: Progressing toward goals    Frequency    BID      PT Plan Current plan remains appropriate    Co-evaluation              AM-PAC PT "6 Clicks" Daily Activity  Outcome Measure  Difficulty turning over in bed (including adjusting bedclothes, sheets and blankets)?: A  Lot Difficulty moving from lying on back to sitting on the side of the bed? : Unable Difficulty sitting down on and standing up from a chair with arms (e.g., wheelchair, bedside commode, etc,.)?: Unable Help needed moving to and from a bed to chair (including a wheelchair)?: A Little Help needed walking in hospital room?: A Little Help needed climbing 3-5 steps with a railing? : Total 6 Click Score: 11    End of Session Equipment Utilized During Treatment: Gait belt;Oxygen Activity Tolerance: Patient limited by fatigue Patient left: in bed;with call bell/phone within reach;with bed alarm set;with family/visitor present;with SCD's reapplied (B heels elevated via towel rolls; SCD's in place and activated) Nurse Communication: Mobility status PT Visit Diagnosis: Other abnormalities of gait and mobility (R26.89);Muscle weakness (generalized) (M62.81);History of falling (Z91.81);Pain Pain - Right/Left: Right Pain - part of body: Hip     Time: 1610-9604 PT Time Calculation (min) (ACUTE ONLY): 16 min  Charges:                       G CodesGwenlyn Found, SPT Dec 26, 2016,1:09 PM [phone]

## 2016-12-07 NOTE — Progress Notes (Signed)
Patient is medically stable for D/C to [facility] today. Per [facility] admissions coordinator at [facility] patient can come today to room 201-B. RN will call report at [phone] and arrange EMS for transport. Clinical Child psychotherapist (CSW) sent D/C orders to [employer] via [employer]. Patient is aware of above. CSW contacted patient's friend/ [employer] and made her aware of above. Please reconsult if future social work needs arise. CSW signing off.   [employer], LCSW [phone]

## 2016-12-07 NOTE — Clinical Social Work Placement (Signed)
   CLINICAL SOCIAL WORK PLACEMENT  NOTE  Date:  12/07/2016  Patient Details  Name: Yvonne Edwards MRN: [id number] Date of Birth: [birth year]  Clinical Social Work is seeking post-discharge placement for this patient at the Skilled  Nursing Facility level of care (*CSW will initial, date and re-position this form in  chart as items are completed):  Yes   Patient/family provided with [place] Clinical Social Work Department's list of facilities offering this level of care within the geographic area requested by the patient (or if unable, by the patient's family).  Yes   Patient/family informed of their freedom to choose among providers that offer the needed level of care, that participate in Medicare, Medicaid or managed care program needed by the patient, have an available bed and are willing to accept the patient.  Yes   Patient/family informed of 's ownership interest in [facility] and [facility], as well as of the fact that they are under no obligation to receive care at these facilities.  PASRR submitted to EDS on       PASRR number received on       Existing PASRR number confirmed on 12/04/16     FL2 transmitted to all facilities in geographic area requested by pt/family on 12/04/16     FL2 transmitted to all facilities within larger geographic area on       Patient informed that his/her managed care company has contracts with or will negotiate with certain facilities, including the following:        Yes   Patient/family informed of bed offers received.  Patient chooses bed at  [facility] )     Physician recommends and patient chooses bed at      Patient to be transferred to  [facility] ) on 12/07/16.  Patient to be transferred to facility by  [facility] )     Patient family notified on 12/07/16 of transfer.  Name of family member notified:   (P[place]et's friend/ [employer] is aware of D/C today. )     PHYSICIAN        Additional Comment:    _______________________________________________ Keyden Pavlov, Darleen Crocker, LCSW 12/07/2016, 11:35 AM

## 2016-12-08 ENCOUNTER — Other Ambulatory Visit: Payer: Self-pay | Admitting: Internal Medicine

## 2016-12-08 DIAGNOSIS — M8000XD Age-related osteoporosis with current pathological fracture, unspecified site, subsequent encounter for fracture with routine healing: Secondary | ICD-10-CM | POA: Insufficient documentation

## 2016-12-08 DIAGNOSIS — R0902 Hypoxemia: Secondary | ICD-10-CM | POA: Insufficient documentation

## 2016-12-08 DIAGNOSIS — S72001A Fracture of unspecified part of neck of right femur, initial encounter for closed fracture: Secondary | ICD-10-CM

## 2016-12-09 ENCOUNTER — Ambulatory Visit
Admission: RE | Admit: 2016-12-09 | Discharge: 2016-12-09 | Disposition: A | Discharge status: Home / Self Care | Payer: Medicare Other | Source: Ambulatory Visit | Attending: Internal Medicine | Admitting: Internal Medicine

## 2016-12-09 ENCOUNTER — Emergency Department: Payer: Medicare Other

## 2016-12-09 ENCOUNTER — Observation Stay
Admission: EM | Admit: 2016-12-09 | Discharge: 2016-12-10 | Disposition: A | Discharge status: Skilled Nursing Facility | Payer: Medicare Other | Attending: Internal Medicine | Admitting: Internal Medicine

## 2016-12-09 ENCOUNTER — Other Ambulatory Visit: Payer: Self-pay

## 2016-12-09 DIAGNOSIS — Z9889 Other specified postprocedural states: Secondary | ICD-10-CM | POA: Diagnosis not present

## 2016-12-09 DIAGNOSIS — R079 Chest pain, unspecified: Principal | ICD-10-CM | POA: Insufficient documentation

## 2016-12-09 DIAGNOSIS — Z91013 Allergy to seafood: Secondary | ICD-10-CM | POA: Insufficient documentation

## 2016-12-09 DIAGNOSIS — Z8249 Family history of ischemic heart disease and other diseases of the circulatory system: Secondary | ICD-10-CM | POA: Insufficient documentation

## 2016-12-09 DIAGNOSIS — M8580 Other specified disorders of bone density and structure, unspecified site: Secondary | ICD-10-CM | POA: Insufficient documentation

## 2016-12-09 DIAGNOSIS — Z79899 Other long term (current) drug therapy: Secondary | ICD-10-CM | POA: Diagnosis not present

## 2016-12-09 DIAGNOSIS — H3530 Unspecified macular degeneration: Secondary | ICD-10-CM | POA: Diagnosis not present

## 2016-12-09 DIAGNOSIS — Z7901 Long term (current) use of anticoagulants: Secondary | ICD-10-CM | POA: Diagnosis not present

## 2016-12-09 DIAGNOSIS — Z96641 Presence of right artificial hip joint: Secondary | ICD-10-CM | POA: Insufficient documentation

## 2016-12-09 DIAGNOSIS — I700 Atherosclerosis of aorta: Secondary | ICD-10-CM | POA: Insufficient documentation

## 2016-12-09 DIAGNOSIS — E785 Hyperlipidemia, unspecified: Secondary | ICD-10-CM | POA: Insufficient documentation

## 2016-12-09 DIAGNOSIS — R0600 Dyspnea, unspecified: Secondary | ICD-10-CM | POA: Diagnosis not present

## 2016-12-09 DIAGNOSIS — Z885 Allergy status to narcotic agent status: Secondary | ICD-10-CM | POA: Insufficient documentation

## 2016-12-09 DIAGNOSIS — H9190 Unspecified hearing loss, unspecified ear: Secondary | ICD-10-CM | POA: Diagnosis not present

## 2016-12-09 DIAGNOSIS — J90 Pleural effusion, not elsewhere classified: Secondary | ICD-10-CM | POA: Insufficient documentation

## 2016-12-09 DIAGNOSIS — R0902 Hypoxemia: Secondary | ICD-10-CM | POA: Diagnosis not present

## 2016-12-09 DIAGNOSIS — I081 Rheumatic disorders of both mitral and tricuspid valves: Secondary | ICD-10-CM | POA: Diagnosis not present

## 2016-12-09 DIAGNOSIS — S72001A Fracture of unspecified part of neck of right femur, initial encounter for closed fracture: Secondary | ICD-10-CM

## 2016-12-09 HISTORY — DX: Unspecified macular degeneration: H35.30

## 2016-12-09 HISTORY — DX: Unspecified hearing loss, unspecified ear: H91.90

## 2016-12-09 LAB — COMPREHENSIVE METABOLIC PANEL
ALBUMIN: 2.9 g/dL — AB (ref 3.5–5.0)
ALK PHOS: 69 U/L (ref 38–126)
ALT: 30 U/L (ref 14–54)
AST: 39 U/L (ref 15–41)
Anion gap: 8 (ref 5–15)
BILIRUBIN TOTAL: 0.8 mg/dL (ref 0.3–1.2)
BUN: 21 mg/dL — AB (ref 6–20)
CO2: 30 mmol/L (ref 22–32)
Calcium: 8.4 mg/dL — ABNORMAL LOW (ref 8.9–10.3)
Chloride: 92 mmol/L — ABNORMAL LOW (ref 101–111)
Creatinine, Ser: 0.91 mg/dL (ref 0.44–1.00)
GFR calc Af Amer: 60 mL/min — ABNORMAL LOW (ref >60)
GFR, EST NON AFRICAN AMERICAN: 52 mL/min — AB (ref >60)
GLUCOSE: 150 mg/dL — AB (ref 65–99)
Potassium: 4.2 mmol/L (ref 3.5–5.1)
Sodium: 130 mmol/L — ABNORMAL LOW (ref 135–145)
TOTAL PROTEIN: 6.3 g/dL — AB (ref 6.5–8.1)

## 2016-12-09 LAB — MRSA PCR SCREENING: MRSA by PCR: POSITIVE — AB

## 2016-12-09 LAB — CBC
HEMATOCRIT: 32.6 % — AB (ref 35.0–47.0)
HEMOGLOBIN: 11 g/dL — AB (ref 12.0–16.0)
MCH: 30.1 pg (ref 26.0–34.0)
MCHC: 33.8 g/dL (ref 32.0–36.0)
MCV: 89.1 fL (ref 80.0–100.0)
PLATELETS: 191 10*3/uL (ref 150–440)
RBC: 3.66 MIL/uL — AB (ref 3.80–5.20)
RDW: 12.8 % (ref 11.5–14.5)
WBC: 10.2 10*3/uL (ref 3.6–11.0)

## 2016-12-09 LAB — TROPONIN I: Troponin I: 0.03 ng/mL (ref <0.03)

## 2016-12-09 LAB — BRAIN NATRIURETIC PEPTIDE: B NATRIURETIC PEPTIDE 5: 204 pg/mL — AB (ref 0.0–100.0)

## 2016-12-09 MED ORDER — VITAMIN D (ERGOCALCIFEROL) 1.25 MG (50000 UNIT) PO CAPS: 50000.0000 [IU] | ORAL_CAPSULE | ORAL | Status: DC

## 2016-12-09 MED ORDER — ONDANSETRON HCL 4 MG PO TABS: 4.0000 mg | ORAL_TABLET | Freq: Four times a day (QID) | ORAL | Status: DC | PRN

## 2016-12-09 MED ORDER — METHOCARBAMOL 500 MG PO TABS
500.0000 mg | ORAL_TABLET | Freq: Four times a day (QID) | ORAL | Status: DC | PRN
  Filled 2016-12-09: qty 1

## 2016-12-09 MED ORDER — SENNA 8.6 MG PO TABS
1.0000 | ORAL_TABLET | Freq: Every day | ORAL | Status: DC
  Administered 2016-12-09 – 2016-12-10 (×2): 8.6 mg via ORAL
  Filled 2016-12-09 (×2): qty 1

## 2016-12-09 MED ORDER — CALCIUM CARBONATE-VITAMIN D 500-200 MG-UNIT PO TABS
1.0000 | ORAL_TABLET | Freq: Two times a day (BID) | ORAL | Status: DC
  Administered 2016-12-09 – 2016-12-10 (×2): 1 via ORAL
  Filled 2016-12-09 (×2): qty 1

## 2016-12-09 MED ORDER — CHLORHEXIDINE GLUCONATE CLOTH 2 % EX PADS
6.0000 | MEDICATED_PAD | Freq: Every day | CUTANEOUS | Status: DC
  Administered 2016-12-10: 6 via TOPICAL

## 2016-12-09 MED ORDER — FUROSEMIDE 40 MG PO TABS
20.0000 mg | ORAL_TABLET | Freq: Once | ORAL | Status: AC
  Administered 2016-12-09: 20 mg via ORAL
  Filled 2016-12-09: qty 1

## 2016-12-09 MED ORDER — HYDROCODONE-ACETAMINOPHEN 5-325 MG PO TABS: 1.0000 | ORAL_TABLET | ORAL | Status: DC | PRN

## 2016-12-09 MED ORDER — ENSURE ENLIVE PO LIQD
237.0000 mL | Freq: Two times a day (BID) | ORAL | Status: DC
  Administered 2016-12-09 – 2016-12-10 (×2): 237 mL via ORAL

## 2016-12-09 MED ORDER — SODIUM CHLORIDE 0.9% FLUSH
3.0000 mL | Freq: Two times a day (BID) | INTRAVENOUS | Status: DC
  Administered 2016-12-09 – 2016-12-10 (×2): 3 mL via INTRAVENOUS

## 2016-12-09 MED ORDER — DOCUSATE SODIUM 100 MG PO CAPS
100.0000 mg | ORAL_CAPSULE | Freq: Two times a day (BID) | ORAL | Status: DC
  Administered 2016-12-09 – 2016-12-10 (×2): 100 mg via ORAL
  Filled 2016-12-09 (×2): qty 1

## 2016-12-09 MED ORDER — ROSUVASTATIN CALCIUM 10 MG PO TABS
10.0000 mg | ORAL_TABLET | Freq: Every day | ORAL | Status: DC
  Administered 2016-12-10: 10 mg via ORAL
  Filled 2016-12-09: qty 1

## 2016-12-09 MED ORDER — ONDANSETRON HCL 4 MG/2ML IJ SOLN: 4.0000 mg | Freq: Four times a day (QID) | INTRAMUSCULAR | Status: DC | PRN

## 2016-12-09 MED ORDER — OCUVITE-LUTEIN PO CAPS
2.0000 | ORAL_CAPSULE | Freq: Every day | ORAL | Status: DC
  Administered 2016-12-10: 2 via ORAL
  Filled 2016-12-09 (×2): qty 2

## 2016-12-09 MED ORDER — POLYETHYLENE GLYCOL 3350 17 G PO PACK: 17.0000 g | PACK | Freq: Every day | ORAL | Status: DC | PRN

## 2016-12-09 MED ORDER — ENSURE ENLIVE PO LIQD: 237.0000 mL | Freq: Two times a day (BID) | ORAL | Status: DC

## 2016-12-09 MED ORDER — ACETAMINOPHEN 325 MG PO TABS
650.0000 mg | ORAL_TABLET | Freq: Four times a day (QID) | ORAL | Status: DC | PRN
  Administered 2016-12-09: 650 mg via ORAL
  Filled 2016-12-09: qty 2

## 2016-12-09 MED ORDER — IOPAMIDOL (ISOVUE-370) INJECTION 76%
75.0000 mL | Freq: Once | INTRAVENOUS | Status: AC | PRN
  Administered 2016-12-09: 75 mL via INTRAVENOUS

## 2016-12-09 MED ORDER — MUPIROCIN 2 % EX OINT
1.0000 "application " | TOPICAL_OINTMENT | Freq: Two times a day (BID) | CUTANEOUS | Status: DC
  Administered 2016-12-09 – 2016-12-10 (×2): 1 via NASAL
  Filled 2016-12-09: qty 22

## 2016-12-09 MED ORDER — ENOXAPARIN SODIUM 30 MG/0.3ML ~~LOC~~ SOLN
30.0000 mg | SUBCUTANEOUS | Status: DC
  Administered 2016-12-09: 30 mg via SUBCUTANEOUS
  Filled 2016-12-09: qty 0.3

## 2016-12-09 NOTE — Progress Notes (Signed)
LCSW did a meet and greet and said hello to caregivers and patient. She is very happy at [facility] and hopes to return once her results are in.     No further needs  [employer] LCSW [phone]

## 2016-12-09 NOTE — ED Triage Notes (Signed)
Pt to ED from [facility] c/o chest pain. Per EMS pt reported centralized chest pain. Pt has a hx of recent hip replacement and has been hypoxic post repair and had a CT today to r/o  PEs and reports CP starting shortly after scan. Pt alert and oriented in no acute distress at this time.

## 2016-12-09 NOTE — H&P (Signed)
[facility] [place] [facility]   PATIENT NAME: Yvonne Edwards    MR#:  [id number]  DATE OF BIRTH:  07-19-1918  DATE OF ADMISSION:  12/09/2016  PRIMARY CARE PHYSICIAN: [name], MD   REQUESTING/REFERRING PHYSICIAN: Dr. Daryel November  CHIEF COMPLAINT:   Chief Complaint  Patient presents with  . Chest Pain    HISTORY OF PRESENT ILLNESS:  Yvonne Edwards  is a 83 y.o. female with  No significant past medical history other than macular degeneration, hearing loss and hyperlipidemia who was recently in the hospital last week and discharged 2 days ago after right hip fracture surgery to her rehabilitation, brought in today secondary to chest pain. Patient denies any known cardiac history. Prior to her hip fracture, she was active at home denies any chest pain. At the time of discharge she was not hypoxic, no dyspnea or chest pain. Yesterday she worked pretty decent with physical therapy. This morning she was very tired, was noted to be pale on the commode after a bowel movement. No dizziness or syncopal episodes. She was noted to be hypoxic with therapy. She was sent to get a CT chest to rule out pulmonary embolism. While she was in the radiology department, she complained of chest pain after the CT. She is sent over to the emergency room. Right now denies any chest pain or dyspnea. Saturations are within normal limits. Slightly elevated troponin noted without any EKG changes. She is being admitted under observation for the same. CT chest is negative for PE.  PAST MEDICAL HISTORY:   Past Medical History:  Diagnosis Date  . Hearing loss   . Hyperlipidemia   . Macular degeneration     PAST SURGICAL HISTORY:   Past Surgical History:  Procedure Laterality Date  . CHOLECYSTECTOMY    . TOTAL HIP ARTHROPLASTY Right 12/05/2016   Procedure: TOTAL HIP ARTHROPLASTY ANTERIOR APPROACH;  Surgeon: Kennedy Bucker, MD;  Location: [facility] ORS;  Service: Orthopedics;  Laterality:  Right;    SOCIAL HISTORY:   Social History  Substance Use Topics  . Smoking status: Never Smoker  . Smokeless tobacco: Never Used  . Alcohol use No    FAMILY HISTORY:   Family History  Problem Relation Age of Onset  . Aneurysm Mother   . Breast cancer Sister   . Ovarian cancer Sister   . CAD Sister   . Lung cancer Brother     DRUG ALLERGIES:   Allergies  Allergen Reactions  . Codeine Other (See Comments)  . Shellfish Allergy Nausea And Vomiting    REVIEW OF SYSTEMS:   Review of Systems  Constitutional: Positive for malaise/fatigue. Negative for chills, fever and weight loss.  HENT: Negative for ear discharge, ear pain, hearing loss, nosebleeds and tinnitus.   Eyes: Negative for blurred vision, double vision and photophobia.  Respiratory: Positive for shortness of breath. Negative for cough, hemoptysis and wheezing.   Cardiovascular: Positive for chest pain. Negative for palpitations, orthopnea and leg swelling.  Gastrointestinal: Negative for abdominal pain, constipation, diarrhea, heartburn, melena, nausea and vomiting.  Genitourinary: Negative for dysuria, frequency, hematuria and urgency.  Musculoskeletal: Negative for back pain, myalgias and neck pain.  Skin: Negative for rash.  Neurological: Negative for dizziness, tingling, tremors, sensory change, speech change, focal weakness and headaches.  Endo/Heme/Allergies: Does not bruise/bleed easily.  Psychiatric/Behavioral: Negative for depression.    MEDICATIONS AT HOME:   Prior to Admission medications   Medication Sig Start Date End Date Taking? Authorizing Provider  acetaminophen (TYLENOL) 325 MG tablet Take 2 tablets (650 mg total) by mouth every 6 (six) hours as needed for mild pain (or Fever >/= 101). 12/07/16   Katharina Caper, MD  calcium-vitamin D (OSCAL 500/200 D-3) 500-200 MG-UNIT tablet Take 1 tablet by mouth 2 (two) times daily. 12/07/16 12/07/17  Katharina Caper, MD  docusate sodium (COLACE) 100 MG  capsule Take 1 capsule (100 mg total) by mouth 2 (two) times daily. 12/07/16   Katharina Caper, MD  enoxaparin (LOVENOX) 30 MG/0.3ML injection Inject 0.3 mLs (30 mg total) into the skin daily. 12/07/16   Katharina Caper, MD  HYDROcodone-acetaminophen (NORCO/VICODIN) 5-325 MG tablet Take 1-2 tablets by mouth every 4 (four) hours as needed (breakthrough pain). 12/07/16   Katharina Caper, MD  methocarbamol (ROBAXIN) 500 MG tablet Take 1 tablet (500 mg total) by mouth every 6 (six) hours as needed for muscle spasms. 12/07/16   Katharina Caper, MD  Multiple Vitamins-Minerals (PRESERVISION AREDS 2 PO) Take 1 capsule by mouth daily.     [provider]  mupirocin ointment (BACTROBAN) 2 % Place 1 application into the nose 2 (two) times daily. 12/07/16   Katharina Caper, MD  naproxen sodium (ANAPROX) 220 MG tablet Take 220 mg by mouth daily as needed.     [provider]  rosuvastatin (CRESTOR) 10 MG tablet Take 10 mg by mouth daily. 04/06/15   [provider]  senna (SENOKOT) 8.6 MG TABS tablet Take 1 tablet (8.6 mg total) by mouth daily. 12/07/16   Katharina Caper, MD  Vitamin D, Ergocalciferol, (DRISDOL) 50000 units CAPS capsule Take 1 capsule (50,000 Units total) by mouth every 7 (seven) days. 12/07/16   Katharina Caper, MD      VITAL SIGNS:  Blood pressure (!) 127/59, pulse 86, temperature 98.5 F (36.9 C), temperature source Oral, resp. rate 14, height 5' (1.524 m), weight 52.6 kg (116 lb), SpO2 97 %.  PHYSICAL EXAMINATION:   Physical Exam  GENERAL:  83 y.o.-year-old elderly patient lying in the bed with no acute distress.  EYES: Pupils equal, round, reactive to light and accommodation. No scleral icterus. Extraocular muscles intact.  HEENT: Head atraumatic, normocephalic. Oropharynx and nasopharynx clear.  NECK:  Supple, no jugular venous distention. No thyroid enlargement, no tenderness.  LUNGS: Normal breath sounds bilaterally, no wheezing,  rhonchi or crepitation. Fine  bibasilar crackles heard.  No use of accessory muscles of respiration.  CARDIOVASCULAR: S1, S2 normal. No rubs, or gallops. 2/6 systolic murmur is present  ABDOMEN: Soft, nontender, nondistended. Bowel sounds present. No organomegaly or mass.  EXTREMITIES: No pedal edema, cyanosis, or clubbing.  Dressing on right hip with old blood, still some swelling with tenderness noted at surgical sutures site. NEUROLOGIC: Cranial nerves II through XII are intact. Muscle strength 5/5 in all extremities. Sensation intact. Gait not checked.  PSYCHIATRIC: The patient is alert and oriented x 3.  SKIN: No obvious rash, lesion, or ulcer.   LABORATORY PANEL:   CBC  Recent Labs Lab 12/09/16 1426  WBC 10.2  HGB 11.0*  HCT 32.6*  PLT 191   ------------------------------------------------------------------------------------------------------------------  Chemistries   Recent Labs Lab 12/09/16 1426  NA 130*  K 4.2  CL 92*  CO2 30  GLUCOSE 150*  BUN 21*  CREATININE 0.91  CALCIUM 8.4*  AST 39  ALT 30  ALKPHOS 69  BILITOT 0.8   ------------------------------------------------------------------------------------------------------------------  Cardiac Enzymes  Recent Labs Lab 12/09/16 1426  TROPONINI 0.03*   ------------------------------------------------------------------------------------------------------------------  RADIOLOGY:  Ct Angio Chest Pe W Or  Wo Contrast  Result Date: 12/09/2016 CLINICAL DATA:  Right hip surgery 5 days ago. Hypoxia. No shortness of breath or chest pain. EXAM: CT ANGIOGRAPHY CHEST WITH CONTRAST TECHNIQUE: Multidetector CT imaging of the chest was performed using the standard protocol during bolus administration of intravenous contrast. Multiplanar CT image reconstructions and MIPs were obtained to evaluate the vascular anatomy. CONTRAST:  Study 5 mL Isovue 370 COMPARISON:  None. FINDINGS: Cardiovascular: Satisfactory opacification of the pulmonary arteries to  the segmental level. No evidence of pulmonary embolism. Normal heart size. No pericardial effusion. Normal caliber thoracic aorta. No thoracic aortic dissection. Thoracic aortic atherosclerosis. Mediastinum/Nodes: No enlarged mediastinal, hilar, or axillary lymph nodes. Thyroid gland, trachea, and esophagus demonstrate no significant findings. Lungs/Pleura: Small bilateral pleural effusions. Mild peripheral interstitial prominence. Patchy areas of ground-glass opacity bilaterally. No pneumothorax. Upper Abdomen: No acute upper abdominal abnormality. Pneumobilia in the left hepatic lobe likely reflecting prior sphincterotomy. Musculoskeletal: No acute osseous abnormality. No aggressive osseous lesion. Review of the MIP images confirms the above findings. IMPRESSION: 1. No evidence of pulmonary embolus. 2. Bilateral small pleural effusions and patchy areas of ground-glass concerning for mild pulmonary edema. 3.  Aortic Atherosclerosis (ICD10-170.0) Electronically Signed   By: Elige Ko   On: 12/09/2016 13:53   Dg Chest Port 1 View  Result Date: 12/09/2016 CLINICAL DATA:  Chest pain. EXAM: PORTABLE CHEST 1 VIEW COMPARISON:  Chest x-ray dated December 04, 2016. FINDINGS: The cardiomediastinal silhouette is normal in size. Atherosclerotic calcification of the aortic arch. Normal pulmonary vascularity. Small left pleural effusion. No focal consolidation or pneumothorax. Osteopenia. No acute osseous abnormality. IMPRESSION: Small left pleural effusion, unchanged. Electronically Signed   By: Obie Dredge M.D.   On: 12/09/2016 14:18    EKG:   Orders placed or performed during the hospital encounter of 12/09/16  . ED EKG  . ED EKG    IMPRESSION AND PLAN:   Eloisa Chokshi  is a 83 y.o. female with  No significant past medical history other than macular degeneration, hearing loss and hyperlipidemia who was recently in the hospital last week and discharged 2 days ago after right hip fracture surgery to her  rehabilitation, brought in today secondary to chest pain.  #1 chest pain and dyspnea-likely secondary to bibasilar atelectasis and small pleural effusions. -CT of the chest negative for PE. Monitor on telemetry -Likely elevated troponin. Recycle troponins. Likely demand ischemia. -Check echocardiogram. Asymptomatic at this time. Received a small dose of oral Lasix in the ER. -Encouraged to do incentive spirometry  #2 hyperlipidemia-continue statin  #3 right hip surgery-postoperative day 4. Some swelling noted at the suture site, no infection seen. Has postop follow up as outpatient.  #4 DVT prophylaxis-on Lovenox  Physical Therapy consulted   All the records are reviewed and case discussed with ED provider. Management plans discussed with the patient, family and they are in agreement.  CODE STATUS: Full Code  TOTAL TIME TAKING CARE OF THIS PATIENT: 50 minutes.    Enid Baas M.D on 12/09/2016 at 4:33 PM  Between 7am to 6pm - Pager - [phone]  After 6pm go to [email or web address] - Social research officer, government  [facility]  Office  [phone]  CC: Primary care physician; [name], MD

## 2016-12-09 NOTE — ED Provider Notes (Signed)
[facility] Emergency Department Provider Note       Time seen: ----------------------------------------- 2:03 PM on 12/09/2016 -----------------------------------------     I have reviewed the triage vital signs and the nursing notes.   HISTORY   Chief Complaint No chief complaint on file.    HPI Yvonne Edwards is a 83 y.o. female who presents to the ED for chest pain. Reportedly she was having an outpatient CT scan done to assess for PE due to recent hip surgery. She subsequently began having some chest pain and was sent to the ER for further evaluation. She is a resident of the [facility] and had a hip replacement done this week. She was noted to be hypoxic upon going to the [facility] which is unusual for her which resulted in her having a CT scan ordered.currently her chest pain is resolving.   Past Medical History:  Diagnosis Date  . Hyperlipidemia     Patient Active Problem List   Diagnosis Date Noted  . S/P total hip arthroplasty 12/07/2016  . Hyponatremia 12/07/2016  . Hyperglycemia 12/07/2016  . Thrombocytopenia (HCC) 12/07/2016  . Leukocytosis 12/07/2016  . MRSA carrier 12/07/2016  . Vitamin D deficiency 12/07/2016  . Hip fracture (HCC) 12/04/2016    Past Surgical History:  Procedure Laterality Date  . CHOLECYSTECTOMY    . TOTAL HIP ARTHROPLASTY Right 12/05/2016   Procedure: TOTAL HIP ARTHROPLASTY ANTERIOR APPROACH;  Surgeon: Kennedy Bucker, MD;  Location: [facility] ORS;  Service: Orthopedics;  Laterality: Right;    Allergies Codeine and Shellfish allergy  Social History Social History  Substance Use Topics  . Smoking status: Never Smoker  . Smokeless tobacco: Never Used  . Alcohol use No    Review of Systems Constitutional: Negative for fever. Eyes: Negative for vision changes ENT:  Negative for congestion, sore throat Cardiovascular: positive for chest pain Respiratory: Negative for shortness of  breath. Gastrointestinal: Negative for abdominal pain, vomiting and diarrhea. Genitourinary: Negative for dysuria. Musculoskeletal:positive for right hip pain if she moves  Skin: Negative for rash. Neurological: Negative for headaches, focal weakness or numbness.  All systems negative/normal/unremarkable except as stated in the HPI  ____________________________________________   PHYSICAL EXAM:  VITAL SIGNS: ED Triage Vitals  Enc Vitals Group     BP      Pulse      Resp      Temp      Temp src      SpO2      Weight      Height      Head Circumference      Peak Flow      Pain Score      Pain Loc      Pain Edu?      Excl. in GC?     Constitutional: Alert and oriented. Well appearing and in no distress. Eyes: Conjunctivae are normal. Normal extraocular movements. ENT   Head: Normocephalic and atraumatic.   Nose: No congestion/rhinnorhea.   Mouth/Throat: Mucous membranes are moist.   Neck: No stridor. Cardiovascular: Normal rate, regular rhythm. Systolic murmur Respiratory: Normal respiratory effort without tachypnea nor retractions. Breath sounds are clear and equal bilaterally. No wheezes/rales/rhonchi. Gastrointestinal: Soft and nontender. Normal bowel sounds Musculoskeletal: pain with range of motion of the right hip Neurologic:  Normal speech and language. No gross focal neurologic deficits are appreciated.  Skin:  Skin is warm, dry and intact. No rash noted. Psychiatric: Mood and affect are normal. Speech and behavior  are normal.  ____________________________________________  EKG: Interpreted by me.sinus rhythm rate of 90 bpm, normal PR interval, normal QRS, normal Q-T. Normal axis. Nonspecific T-wave changes.  ____________________________________________  ED COURSE:  Pertinent labs & imaging results that were available during my care of the patient were reviewed by me and considered in my medical decision making (see chart for details). Patient  presents for chest pain, we will assess with labs and imaging as indicated.   Procedures ____________________________________________   LABS (pertinent positives/negatives)  Labs Reviewed  CBC - Abnormal; Notable for the following:       Result Value   RBC 3.66 (*)    Hemoglobin 11.0 (*)    HCT 32.6 (*)    All other components within normal limits  TROPONIN I - Abnormal; Notable for the following:    Troponin I 0.03 (*)    All other components within normal limits  COMPREHENSIVE METABOLIC PANEL - Abnormal; Notable for the following:    Sodium 130 (*)    Chloride 92 (*)    Glucose, Bld 150 (*)    BUN 21 (*)    Calcium 8.4 (*)    Total Protein 6.3 (*)    Albumin 2.9 (*)    GFR calc non Af Amer 52 (*)    GFR calc Af Amer 60 (*)    All other components within normal limits  BRAIN NATRIURETIC PEPTIDE - Abnormal; Notable for the following:    B Natriuretic Peptide 204.0 (*)    All other components within normal limits    RADIOLOGY Images were viewed by me  Chest x-ray IMPRESSION: Small left pleural effusion, unchanged. IMPRESSION: 1. No evidence of pulmonary embolus. 2. Bilateral small pleural effusions and patchy areas of ground-glass concerning for mild pulmonary edema. 3.  Aortic Atherosclerosis (ICD10-170.0)  ____________________________________________  FINAL ASSESSMENT AND PLAN  Chest pain, shortness of breath  Plan: Patient's labs and imaging were dictated above. Patient had presented for shortness of breath and acute onset chest pain several days after recent hip surgery. In essence the surgery may have stressed her heart, caused a minor heart attack or she may be developing congestive heart failure. We will give a low dose of Lasix and she will need observation with echocardiogram.   Emily Filbert, MD   Note: This note was generated in part or whole with voice recognition software. Voice recognition is usually quite accurate but there are  transcription errors that can and very often do occur. I apologize for any typographical errors that were not detected and corrected.     Emily Filbert, MD 12/09/16 [phone]

## 2016-12-10 ENCOUNTER — Observation Stay (HOSPITAL_BASED_OUTPATIENT_CLINIC_OR_DEPARTMENT_OTHER)
Admission: RE | Admit: 2016-12-10 | Discharge: 2016-12-10 | Disposition: A | Discharge status: Home / Self Care | Payer: Medicare Other | Source: Ambulatory Visit | Attending: Internal Medicine | Admitting: Internal Medicine

## 2016-12-10 DIAGNOSIS — R079 Chest pain, unspecified: Secondary | ICD-10-CM | POA: Diagnosis not present

## 2016-12-10 DIAGNOSIS — I361 Nonrheumatic tricuspid (valve) insufficiency: Secondary | ICD-10-CM

## 2016-12-10 LAB — ECHOCARDIOGRAM COMPLETE
HEIGHTINCHES: 60 in
Weight: 1790.4 oz

## 2016-12-10 LAB — CBC
HEMATOCRIT: 30.4 % — AB (ref 35.0–47.0)
HEMOGLOBIN: 10.5 g/dL — AB (ref 12.0–16.0)
MCH: 31.1 pg (ref 26.0–34.0)
MCHC: 34.5 g/dL (ref 32.0–36.0)
MCV: 90.2 fL (ref 80.0–100.0)
Platelets: 185 10*3/uL (ref 150–440)
RBC: 3.37 MIL/uL — AB (ref 3.80–5.20)
RDW: 12.8 % (ref 11.5–14.5)
WBC: 6.6 10*3/uL (ref 3.6–11.0)

## 2016-12-10 LAB — BASIC METABOLIC PANEL
ANION GAP: 7 (ref 5–15)
BUN: 23 mg/dL — ABNORMAL HIGH (ref 6–20)
CHLORIDE: 97 mmol/L — AB (ref 101–111)
CO2: 32 mmol/L (ref 22–32)
Calcium: 8.7 mg/dL — ABNORMAL LOW (ref 8.9–10.3)
Creatinine, Ser: 0.67 mg/dL (ref 0.44–1.00)
GFR calc Af Amer: >60 mL/min (ref >60)
Glucose, Bld: 121 mg/dL — ABNORMAL HIGH (ref 65–99)
POTASSIUM: 4 mmol/L (ref 3.5–5.1)
SODIUM: 136 mmol/L (ref 135–145)

## 2016-12-10 LAB — TROPONIN I: Troponin I: <0.03 ng/mL (ref <0.03)

## 2016-12-10 MED ORDER — FUROSEMIDE 10 MG/ML IJ SOLN
20.0000 mg | Freq: Once | INTRAMUSCULAR | Status: AC
  Administered 2016-12-10: 20 mg via INTRAVENOUS
  Filled 2016-12-10: qty 2

## 2016-12-10 MED ORDER — ENSURE ENLIVE PO LIQD: 237.0000 mL | Freq: Two times a day (BID) | ORAL | 12 refills | Status: DC

## 2016-12-10 MED ORDER — HYDROCODONE-ACETAMINOPHEN 5-325 MG PO TABS: 1.0000 | ORAL_TABLET | ORAL | 0 refills | Status: DC | PRN

## 2016-12-10 MED ORDER — ENOXAPARIN SODIUM 30 MG/0.3ML ~~LOC~~ SOLN: 30.0000 mg | SUBCUTANEOUS | 0 refills | Status: DC

## 2016-12-10 MED ORDER — POLYETHYLENE GLYCOL 3350 17 G PO PACK: 17.0000 g | PACK | Freq: Every day | ORAL | 0 refills | Status: DC | PRN

## 2016-12-10 NOTE — Progress Notes (Signed)
Went over discharge instruction about new medication and follow up visit with the family, provide hand out and teach back. [facility] for report. Called EMS for transport. Discontinue peripheral IV and telemetry monitor. RN will continue to monitor.

## 2016-12-10 NOTE — NC FL2 (Signed)
[place] MEDICAID FL2 LEVEL OF CARE SCREENING TOOL     IDENTIFICATION  Patient Name: Yvonne Edwards Birthdate: [birth year] Sex: female Admission Date (Current Location): 12/09/2016  [place] and [place] Number:  Chiropodist and Address:  [facility], [address] [place], [place] [address]      Provider Number: [id number]  Attending Physician Name and Address:  Enid Baas, MD  Relative Name and Phone Number:  [name] (friend) [phone]    Current Level of Care: Hospital Recommended Level of Care: Skilled Nursing Facility Prior Approval Number:    Date Approved/Denied:   PASRR Number:  [id number]   Discharge Plan: SNF    Current Diagnoses: Patient Active Problem List   Diagnosis Date Noted  . Chest pain 12/09/2016  . S/P total hip arthroplasty 12/07/2016  . Hyponatremia 12/07/2016  . Hyperglycemia 12/07/2016  . Thrombocytopenia (HCC) 12/07/2016  . Leukocytosis 12/07/2016  . MRSA carrier 12/07/2016  . Vitamin D deficiency 12/07/2016  . Hip fracture (HCC) 12/04/2016    Orientation RESPIRATION BLADDER Height & Weight     Self, Time, Situation, Place  O2 (2l o2 acute) Incontinent Weight: 111 lb 14.4 oz (50.8 kg) Height:  5' (152.4 cm)  BEHAVIORAL SYMPTOMS/MOOD NEUROLOGICAL BOWEL NUTRITION STATUS      Incontinent Diet (Heart healthy)  AMBULATORY STATUS COMMUNICATION OF NEEDS Skin   Supervision Verbally Surgical wounds                       Personal Care Assistance Level of Assistance    Bathing Assistance: Independent Feeding assistance: Independent Dressing Assistance: Independent Total Care Assistance: Independent   Functional Limitations Info    Sight Info: Adequate Hearing Info: Adequate Speech Info: Adequate    SPECIAL CARE FACTORS FREQUENCY  PT (By licensed PT), OT (By licensed OT)     PT Frequency: Up to 5X per day, 5 days per week OT Frequency: Up to 5X per day, 5 days per week             Contractures Contractures Info: Not present    Additional Factors Info  Allergies   Allergies Info: Codeine, Shellfish Allergy           Current Medications (12/10/2016):  This is the current hospital active medication list Current Facility-Administered Medications  Medication Dose Route Frequency Provider Last Rate Last Dose  . acetaminophen (TYLENOL) tablet 650 mg  650 mg Oral Q6H PRN Enid Baas, MD   650 mg at 12/09/16 1952  . calcium-vitamin D (OSCAL WITH D) 500-200 MG-UNIT per tablet 1 tablet  1 tablet Oral BID Enid Baas, MD   1 tablet at 12/09/16 2207  . Chlorhexidine Gluconate Cloth 2 % PADS 6 each  6 each Topical Q0600 Enid Baas, MD   6 each at 12/10/16 [phone]  . docusate sodium (COLACE) capsule 100 mg  100 mg Oral BID Enid Baas, MD   100 mg at 12/09/16 2207  . enoxaparin (LOVENOX) injection 30 mg  30 mg Subcutaneous Q24H Enid Baas, MD   30 mg at 12/09/16 2207  . feeding supplement (ENSURE ENLIVE) (ENSURE ENLIVE) liquid 237 mL  237 mL Oral BID BM Enid Baas, MD   237 mL at 12/09/16 1800  . furosemide (LASIX) injection 20 mg  20 mg Intravenous Once Enid Baas, MD      . HYDROcodone-acetaminophen (NORCO/VICODIN) 5-325 MG per tablet 1-2 tablet  1-2 tablet Oral Q4H PRN Enid Baas, MD      .  methocarbamol (ROBAXIN) tablet 500 mg  500 mg Oral Q6H PRN Enid Baas, MD      . multivitamin-lutein (OCUVITE-LUTEIN) capsule 2 capsule  2 capsule Oral Daily Enid Baas, MD      . mupirocin ointment (BACTROBAN) 2 % 1 application  1 application Nasal BID Enid Baas, MD   1 application at 12/09/16 2208  . ondansetron (ZOFRAN) tablet 4 mg  4 mg Oral Q6H PRN Enid Baas, MD       Or  . ondansetron (ZOFRAN) injection 4 mg  4 mg Intravenous Q6H PRN Enid Baas, MD      . polyethylene glycol (MIRALAX / GLYCOLAX) packet 17 g  17 g Oral Daily PRN Enid Baas, MD      . rosuvastatin  (CRESTOR) tablet 10 mg  10 mg Oral Daily Enid Baas, MD      . senna Mancel Parsons) tablet 8.6 mg  1 tablet Oral Daily Enid Baas, MD   8.6 mg at 12/09/16 2207  . sodium chloride flush (NS) 0.9 % injection 3 mL  3 mL Intravenous Q12H Enid Baas, MD   3 mL at 12/09/16 2212  . [START ON 12/14/2016] Vitamin D (Ergocalciferol) (DRISDOL) capsule 50,000 Units  50,000 Units Oral Q7 days Enid Baas, MD         Discharge Medications: Please see discharge summary for a list of discharge medications.  Relevant Imaging Results:  Relevant Lab Results:   Additional Information SSN # [id number]  Judi Cong, LCSW

## 2016-12-10 NOTE — Progress Notes (Signed)
[facility] [facility] [place] [facility]   PATIENT NAME: Yvonne Edwards    MR#:  [id number]  DATE OF BIRTH:  06-09-1918  SUBJECTIVE:  CHIEF COMPLAINT:   Chief Complaint  Patient presents with  . Chest Pain   - feels better today, no chest pain or dyspnea - remains on 2L o2  REVIEW OF SYSTEMS:  Review of Systems  Constitutional: Negative for chills, fever and malaise/fatigue.  HENT: Negative for ear discharge, hearing loss and nosebleeds.   Eyes: Negative for blurred vision and double vision.  Respiratory: Negative for cough, shortness of breath and wheezing.   Cardiovascular: Negative for chest pain and palpitations.  Gastrointestinal: Negative for abdominal pain, constipation, diarrhea, nausea and vomiting.  Genitourinary: Positive for frequency. Negative for dysuria.  Musculoskeletal: Positive for joint pain.  Neurological: Negative for dizziness, speech change, focal weakness, seizures and headaches.  Psychiatric/Behavioral: Negative for depression.    DRUG ALLERGIES:   Allergies  Allergen Reactions  . Codeine Other (See Comments)  . Shellfish Allergy Nausea And Vomiting    VITALS:  Blood pressure (!) 161/80, pulse 83, temperature 98 F (36.7 C), temperature source Oral, resp. rate 18, height 5' (1.524 m), weight 50.8 kg (111 lb 14.4 oz), SpO2 100 %.  PHYSICAL EXAMINATION:  Physical Exam  GENERAL:  83 y.o.-year-old elderly patient lying in the bed with no acute distress.  EYES: Pupils equal, round, reactive to light and accommodation. No scleral icterus. Extraocular muscles intact.  HEENT: Head atraumatic, normocephalic. Oropharynx and nasopharynx clear.  NECK:  Supple, no jugular venous distention. No thyroid enlargement, no tenderness.  LUNGS: Normal breath sounds bilaterally, no wheezing,  rhonchi or crepitation. Fine bibasilar crackles heard.  No use of accessory muscles of respiration.  CARDIOVASCULAR: S1, S2 normal. No rubs, or gallops. 2/6  systolic murmur is present  ABDOMEN: Soft, nontender, nondistended. Bowel sounds present. No organomegaly or mass.  EXTREMITIES: No pedal edema, cyanosis, or clubbing.  Dressing on right hip with old blood, still some swelling with tenderness noted at surgical sutures site. NEUROLOGIC: Cranial nerves II through XII are intact. Muscle strength 5/5 in all extremities. Sensation intact. Gait not checked.  PSYCHIATRIC: The patient is alert and oriented x 3.  SKIN: No obvious rash, lesion, or ulcer.    LABORATORY PANEL:   CBC  Recent Labs Lab 12/10/16 0524  WBC 6.6  HGB 10.5*  HCT 30.4*  PLT 185   ------------------------------------------------------------------------------------------------------------------  Chemistries   Recent Labs Lab 12/09/16 1426 12/10/16 0524  NA 130* 136  K 4.2 4.0  CL 92* 97*  CO2 30 32  GLUCOSE 150* 121*  BUN 21* 23*  CREATININE 0.91 0.67  CALCIUM 8.4* 8.7*  AST 39  --   ALT 30  --   ALKPHOS 69  --   BILITOT 0.8  --    ------------------------------------------------------------------------------------------------------------------  Cardiac Enzymes  Recent Labs Lab 12/10/16 0524  TROPONINI <0.03   ------------------------------------------------------------------------------------------------------------------  RADIOLOGY:  Ct Angio Chest Pe W Or Wo Contrast  Result Date: 12/09/2016 CLINICAL DATA:  Right hip surgery 5 days ago. Hypoxia. No shortness of breath or chest pain. EXAM: CT ANGIOGRAPHY CHEST WITH CONTRAST TECHNIQUE: Multidetector CT imaging of the chest was performed using the standard protocol during bolus administration of intravenous contrast. Multiplanar CT image reconstructions and MIPs were obtained to evaluate the vascular anatomy. CONTRAST:  Study 5 mL Isovue 370 COMPARISON:  None. FINDINGS: Cardiovascular: Satisfactory opacification of the pulmonary arteries to the segmental level. No evidence of pulmonary  embolism.  Normal heart size. No pericardial effusion. Normal caliber thoracic aorta. No thoracic aortic dissection. Thoracic aortic atherosclerosis. Mediastinum/Nodes: No enlarged mediastinal, hilar, or axillary lymph nodes. Thyroid gland, trachea, and esophagus demonstrate no significant findings. Lungs/Pleura: Small bilateral pleural effusions. Mild peripheral interstitial prominence. Patchy areas of ground-glass opacity bilaterally. No pneumothorax. Upper Abdomen: No acute upper abdominal abnormality. Pneumobilia in the left hepatic lobe likely reflecting prior sphincterotomy. Musculoskeletal: No acute osseous abnormality. No aggressive osseous lesion. Review of the MIP images confirms the above findings. IMPRESSION: 1. No evidence of pulmonary embolus. 2. Bilateral small pleural effusions and patchy areas of ground-glass concerning for mild pulmonary edema. 3.  Aortic Atherosclerosis (ICD10-170.0) Electronically Signed   By: Elige Ko   On: 12/09/2016 13:53   Dg Chest Port 1 View  Result Date: 12/09/2016 CLINICAL DATA:  Chest pain. EXAM: PORTABLE CHEST 1 VIEW COMPARISON:  Chest x-ray dated December 04, 2016. FINDINGS: The cardiomediastinal silhouette is normal in size. Atherosclerotic calcification of the aortic arch. Normal pulmonary vascularity. Small left pleural effusion. No focal consolidation or pneumothorax. Osteopenia. No acute osseous abnormality. IMPRESSION: Small left pleural effusion, unchanged. Electronically Signed   By: Obie Dredge M.D.   On: 12/09/2016 14:18    EKG:   Orders placed or performed during the hospital encounter of 12/09/16  . ED EKG  . ED EKG    ASSESSMENT AND PLAN:   Yvonne Edwards  is a 83 y.o. female with  No significant past medical history other than macular degeneration, hearing loss and hyperlipidemia who was recently in the hospital last week and discharged 2 days ago after right hip fracture surgery to her rehabilitation, brought in today secondary to chest  pain.  #1 chest pain and dyspnea-likely secondary to bibasilar atelectasis and small pleural effusions. -CT of the chest negative for PE. No changes on telemetry -Negative troponins x 3 now -pending echocardiogram. Asymptomatic at this time. Received a small dose of oral Lasix in the ER and 1 dose today -Encouraged to do incentive spirometry - on 2l o2 which is acute need- wean as tolerated  #2 hyperlipidemia-continue statin  #3 right hip surgery-postoperative day 5. Some swelling noted at the suture site, no infection seen. Has ortho follow up as outpatient.  #4 DVT prophylaxis-on Lovenox  Physical Therapy consulted- possible discharge back to [facility] today if ECHO is normal and weaned off o2.     All the records are reviewed and case discussed with Care Management/Social Workerr. Management plans discussed with the patient, family and they are in agreement.  CODE STATUS:  Full Code  TOTAL TIME TAKING CARE OF THIS PATIENT: 37 minutes.   POSSIBLE D/C IN 1-2 DAYS, DEPENDING ON CLINICAL CONDITION.   Amya Hlad M.D on 12/10/2016 at 9:30 AM  Between 7am to 6pm - Pager - [phone]  After 6pm go to [email or web address] - Social research officer, government  [facility]  Office  [phone]  CC: Primary care physician; [name], MD

## 2016-12-10 NOTE — Care Management Note (Signed)
Case Management Note  Patient Details  Name: BAYYINAH HARER MRN: [id number] Date of Birth: 08/16/1918  Subjective/Objective:    Discussed discharge planning back to [facility] with niece Lenore Cordia [phone] at her request. Reportedly Ms Greger is requesting a private room at [facility] instead of her current semi-private room. Weekend Child psychotherapist reports that Ms Lechman and or family will need to discuss her bed assignment with an Production designer, theatre/television/film at [employer] when she returns. No other needs identified.                 Action/Plan:   Expected Discharge Date:  12/10/16               Expected Discharge Plan:     In-House Referral:     Discharge planning Services     Post Acute Care Choice:    Choice offered to:     DME Arranged:    DME Agency:     HH Arranged:    HH Agency:     Status of Service:     If discussed at [employer] [facility] [employer], dates discussed:    Additional Comments:  Skylynn Burkley A, RN 12/10/2016, 10:02 AM

## 2016-12-10 NOTE — Discharge Summary (Signed)
[facility] - [place] at [facility]   PATIENT NAME: Yvonne Edwards    MR#:  [id number]  DATE OF BIRTH:  [birth year]  DATE OF ADMISSION:  12/09/2016   ADMITTING PHYSICIAN: Enid Baas, MD  DATE OF DISCHARGE: 12/10/2016  PRIMARY CARE PHYSICIAN: [name], MD   ADMISSION DIAGNOSIS:   Nonspecific chest pain [R07.9] Dyspnea, unspecified type [R06.00]  DISCHARGE DIAGNOSIS:   Active Problems:   Chest pain   SECONDARY DIAGNOSIS:   Past Medical History:  Diagnosis Date  . Hearing loss   . Hyperlipidemia   . Macular degeneration     HOSPITAL COURSE:   Yvonne Edwards a 83 y.o. femalewith No significant past medical history other than macular degeneration, hearing loss and hyperlipidemia who was recently in the hospital last week and discharged 2 days ago after right hip fracture surgery to her rehabilitation, brought in today secondary to chest pain.  #1 chest pain and dyspnea-likely secondary to bibasilar atelectasis and small pleural effusions. -CT of the chest negative for PE. No changes on telemetry -Negative troponins x 3 now -pending echocardiogram. Asymptomatic at this time. Received a small dose of oral Lasix in the ER and 1 dose today -Encouraged to do incentive spirometry - on 2l o2 which is acute need- wean as tolerated  #2 hyperlipidemia-continue statin  #3 right hip surgery-postoperative day 5. Some swelling noted at the suture site, no infection seen. Has ortho follow up as outpatient.  #4 DVT prophylaxis-on Lovenox- continue for 10 more days, as per schedule postoperatively  Physical Therapy consulted- possible discharge back to [facility] today if ECHO is normal    DISCHARGE CONDITIONS:   Guarded  CONSULTS OBTAINED:   None  DRUG ALLERGIES:   Allergies  Allergen Reactions  . Codeine Other (See Comments)  . Shellfish Allergy Nausea And Vomiting   DISCHARGE MEDICATIONS:   Allergies as of 12/10/2016    Reactions   Codeine Other (See Comments)   Shellfish Allergy Nausea And Vomiting      Medication List    STOP taking these medications   naproxen sodium 220 MG tablet Commonly known as:  ANAPROX     TAKE these medications   acetaminophen 325 MG tablet Commonly known as:  TYLENOL Take 2 tablets (650 mg total) by mouth every 6 (six) hours as needed for mild pain (or Fever >/= 101).   calcium-vitamin D 500-200 MG-UNIT tablet Commonly known as:  OSCAL 500/200 D-3 Take 1 tablet by mouth 2 (two) times daily.   docusate sodium 100 MG capsule Commonly known as:  COLACE Take 1 capsule (100 mg total) by mouth 2 (two) times daily.   enoxaparin 30 MG/0.3ML injection Commonly known as:  LOVENOX Inject 0.3 mLs (30 mg total) into the skin daily. For 10 days days as previously prescribed. What changed:  additional instructions   feeding supplement (ENSURE ENLIVE) Liqd Take 237 mLs by mouth 2 (two) times daily between meals.   HYDROcodone-acetaminophen 5-325 MG tablet Commonly known as:  NORCO/VICODIN Take 1-2 tablets by mouth every 4 (four) hours as needed for moderate pain or severe pain (breakthrough pain). What changed:  reasons to take this   methocarbamol 500 MG tablet Commonly known as:  ROBAXIN Take 1 tablet (500 mg total) by mouth every 6 (six) hours as needed for muscle spasms.   mupirocin ointment 2 % Commonly known as:  BACTROBAN Place 1 application into the nose 2 (two) times daily.   polyethylene glycol packet Commonly known as:  MIRALAX /  GLYCOLAX Take 17 g by mouth daily as needed for mild constipation.   PRESERVISION AREDS 2 PO Take 1 capsule by mouth daily.   rosuvastatin 10 MG tablet Commonly known as:  CRESTOR Take 10 mg by mouth daily.   senna 8.6 MG Tabs tablet Commonly known as:  SENOKOT Take 1 tablet (8.6 mg total) by mouth daily.   Vitamin D (Ergocalciferol) 50000 units Caps capsule Commonly known as:  DRISDOL Take 1 capsule (50,000 Units total)  by mouth every 7 (seven) days.            Discharge Care Instructions        Start     Ordered   12/10/16 0000  enoxaparin (LOVENOX) 30 MG/0.3ML injection  Every 24 hours     12/10/16 1023   12/10/16 0000  feeding supplement, ENSURE ENLIVE, (ENSURE ENLIVE) LIQD  2 times daily between meals     12/10/16 1023   12/10/16 0000  HYDROcodone-acetaminophen (NORCO/VICODIN) 5-325 MG tablet  Every 4 hours PRN     12/10/16 1023   12/10/16 0000  polyethylene glycol (MIRALAX / GLYCOLAX) packet  Daily PRN     12/10/16 1023   12/10/16 0000  Diet - low sodium heart healthy     12/10/16 1023   12/10/16 0000  Activity as tolerated - No restrictions     12/10/16 1023       DISCHARGE INSTRUCTIONS:   1. PCP follow-up in 1-2 weeks 2. Orthopedic follow-up in 10 days 3. Physical therapy at least twice a day, make sure that the patient is doing incentive spirometry and is sitting upright for most of the day  DIET:   Cardiac diet  ACTIVITY:   Activity as tolerated  OXYGEN:   Home Oxygen: No.  Oxygen Delivery: room air  DISCHARGE LOCATION:   nursing home   If you experience worsening of your admission symptoms, develop shortness of breath, life threatening emergency, suicidal or homicidal thoughts you must seek medical attention immediately by calling 911 or calling your MD immediately  if symptoms less severe.  You Must read complete instructions/literature along with all the possible adverse reactions/side effects for all the Medicines you take and that have been prescribed to you. Take any new Medicines after you have completely understood and accpet all the possible adverse reactions/side effects.   Please note  You were cared for by a hospitalist during your hospital stay. If you have any questions about your discharge medications or the care you received while you were in the hospital after you are discharged, you can call the unit and asked to speak with the hospitalist on call  if the hospitalist that took care of you is not available. Once you are discharged, your primary care physician will handle any further medical issues. Please note that NO REFILLS for any discharge medications will be authorized once you are discharged, as it is imperative that you return to your primary care physician (or establish a relationship with a primary care physician if you do not have one) for your aftercare needs so that they can reassess your need for medications and monitor your lab values.    On the day of Discharge:  VITAL SIGNS:   Blood pressure (!) 150/72, pulse (!) 105, temperature 98.1 F (36.7 C), temperature source Oral, resp. rate 18, height 5' (1.524 m), weight 50.8 kg (111 lb 14.4 oz), SpO2 96 %.  PHYSICAL EXAMINATION:   GENERAL: 83 y.o.-year-old elderly patient lying in the bed with  no acute distress.  EYES: Pupils equal, round, reactive to light and accommodation. No scleral icterus. Extraocular muscles intact.  HEENT: Head atraumatic, normocephalic. Oropharynx and nasopharynx clear.  NECK: Supple, no jugular venous distention. No thyroid enlargement, no tenderness.  LUNGS: Normal breath sounds bilaterally, no wheezing, rhonchi or crepitation. Fine bibasilar crackles heard. No use of accessory muscles of respiration.  CARDIOVASCULAR: S1, S2 normal. No rubs, or gallops. 2/6 systolic murmur is present ABDOMEN: Soft, nontender, nondistended. Bowel sounds present. No organomegaly or mass.  EXTREMITIES: No pedal edema, cyanosis, or clubbing.  Dressing on right hip with old blood, still some swelling with tenderness noted at surgical sutures site. NEUROLOGIC: Cranial nerves II through XII are intact. Muscle strength 5/5 in all extremities. Sensation intact. Gait not checked.  PSYCHIATRIC: The patient is alert and oriented x 3.  SKIN: No obvious rash, lesion, or ulcer.    DATA REVIEW:   CBC  Recent Labs Lab 12/10/16 0524  WBC 6.6  HGB 10.5*  HCT 30.4*    PLT 185    Chemistries   Recent Labs Lab 12/09/16 1426 12/10/16 0524  NA 130* 136  K 4.2 4.0  CL 92* 97*  CO2 30 32  GLUCOSE 150* 121*  BUN 21* 23*  CREATININE 0.91 0.67  CALCIUM 8.4* 8.7*  AST 39  --   ALT 30  --   ALKPHOS 69  --   BILITOT 0.8  --      Microbiology Results  Results for orders placed or performed during the hospital encounter of 12/09/16  MRSA PCR Screening     Status: Abnormal   Collection Time: 12/09/16  6:18 PM  Result Value Ref Range Status   MRSA by PCR POSITIVE (A) NEGATIVE Final    Comment:        The GeneXpert MRSA Assay (FDA approved for NASAL specimens only), is one component of a comprehensive MRSA colonization surveillance program. It is not intended to diagnose MRSA infection nor to guide or monitor treatment for MRSA infections. CRITICAL RESULT CALLED TO, READ BACK BY AND VERIFIED WITH: MARSIEL TURNER AT 1957 ON 12/09/2016 JJB     RADIOLOGY:  Ct Angio Chest Pe W Or Wo Contrast  Result Date: 12/09/2016 CLINICAL DATA:  Right hip surgery 5 days ago. Hypoxia. No shortness of breath or chest pain. EXAM: CT ANGIOGRAPHY CHEST WITH CONTRAST TECHNIQUE: Multidetector CT imaging of the chest was performed using the standard protocol during bolus administration of intravenous contrast. Multiplanar CT image reconstructions and MIPs were obtained to evaluate the vascular anatomy. CONTRAST:  Study 5 mL Isovue 370 COMPARISON:  None. FINDINGS: Cardiovascular: Satisfactory opacification of the pulmonary arteries to the segmental level. No evidence of pulmonary embolism. Normal heart size. No pericardial effusion. Normal caliber thoracic aorta. No thoracic aortic dissection. Thoracic aortic atherosclerosis. Mediastinum/Nodes: No enlarged mediastinal, hilar, or axillary lymph nodes. Thyroid gland, trachea, and esophagus demonstrate no significant findings. Lungs/Pleura: Small bilateral pleural effusions. Mild peripheral interstitial prominence. Patchy areas  of ground-glass opacity bilaterally. No pneumothorax. Upper Abdomen: No acute upper abdominal abnormality. Pneumobilia in the left hepatic lobe likely reflecting prior sphincterotomy. Musculoskeletal: No acute osseous abnormality. No aggressive osseous lesion. Review of the MIP images confirms the above findings. IMPRESSION: 1. No evidence of pulmonary embolus. 2. Bilateral small pleural effusions and patchy areas of ground-glass concerning for mild pulmonary edema. 3.  Aortic Atherosclerosis (ICD10-170.0) Electronically Signed   By: Elige Ko   On: 12/09/2016 13:53   Dg Chest Port 1 [address]  Result Date: 12/09/2016 CLINICAL DATA:  Chest pain. EXAM: PORTABLE CHEST 1 VIEW COMPARISON:  Chest x-ray dated December 04, 2016. FINDINGS: The cardiomediastinal silhouette is normal in size. Atherosclerotic calcification of the aortic arch. Normal pulmonary vascularity. Small left pleural effusion. No focal consolidation or pneumothorax. Osteopenia. No acute osseous abnormality. IMPRESSION: Small left pleural effusion, unchanged. Electronically Signed   By: Obie Dredge M.D.   On: 12/09/2016 14:18     Management plans discussed with the patient, family and they are in agreement.  CODE STATUS:     Code Status Orders        Start     Ordered   12/09/16 1733  Full code  Continuous     12/09/16 1732    Code Status History    Date Active Date Inactive Code Status Order ID Comments User Context   12/04/2016  5:40 PM 12/07/2016  5:30 PM Full Code [id number]  Gracelyn Nurse, MD Inpatient    Advance Directive Documentation     Most Recent Value  Type of Advance Directive  Healthcare Power of Attorney, Living will  Pre-existing out of facility DNR order (yellow form or pink MOST form)  -  "MOST" Form in Place?  -      TOTAL TIME TAKING CARE OF THIS PATIENT: 37 minutes.    Coen Miyasato M.D on 12/10/2016 at 10:56 AM  Between 7am to 6pm - Pager - [phone]  After 6pm go to [email or web address] -  Social research officer, government  [facility] [place] [facility]  Office  [phone]  CC: Primary care physician; [name], MD   Note: This dictation was prepared with Dragon dictation along with smaller phrase technology. Any transcriptional errors that result from this process are unintentional.

## 2016-12-10 NOTE — Evaluation (Signed)
Physical Therapy Evaluation Patient Details Name: Yvonne Edwards MRN: [id number] DOB: [birth year] Today's Date: 12/10/2016   History of Present Illness  Yvonne Edwards is a 83yo white female who comes to [facility] on 8/24 with CP and desaturation. Pt was admitted on 8/19 sp fall with hip fracture, now s/p Anterior THA (Mens, no precautions). Postoperatively, pt did have some acute SOB while working with PT. Since arrival imaging (-) for PE, (+) for osteopenia. PMH: MD, HOH, HLD.    Clinical Impression  Pt admitted with above diagnosis. Pt currently with functional limitations due to the deficits listed below (see "PT Problem List"). Upon entry, the patient is received semirecumbent in bed, niece present. The pt is awake and agreeable to participate. No acute distress noted at this time. Functional mobility assessment demonstrates moderate-heavy strength impairment globally, the pt now requiring Mod-assist physical assistance for bed mobility, transfers, and gait: no meaningful AMB distances this visit, only appropriate for standing pivot transfers. Pt will benefit from skilled PT intervention to increase independence and safety with basic mobility in preparation for discharge to the venue listed below.       Follow Up Recommendations SNF (return to [facility] to cont rehab )    Equipment Recommendations  None recommended by PT    Recommendations for Other Services       Precautions / Restrictions Precautions Precautions: Fall (no hip precautions (Mens patient) ) Restrictions Weight Bearing Restrictions: Yes RLE Weight Bearing: Weight bearing as tolerated      Mobility  Bed Mobility Overal bed mobility: Needs Assistance Bed Mobility: Supine to Sit     Supine to sit: HOB elevated;Mod assist     General bed mobility comments: physical assist with RLE and with trunk to scoot forward  Transfers Overall transfer level: Needs assistance Equipment used: None;1 person hand held  assist Transfers: Sit to/from Stand Sit to Stand: Mod assist         General transfer comment: able to maintain standing while holding onto therapist  Ambulation/Gait Ambulation/Gait assistance: Mod assist Ambulation Distance (Feet): 2 Feet Assistive device: None;1 person hand held assist     Gait velocity interpretation: <1.8 ft/sec, indicative of risk for recurrent falls General Gait Details: pt holding onto PT B brachia, requires mod-maxA for weight shiting and support  Stairs            Wheelchair Mobility    Modified Rankin (Stroke Patients Only)       Balance Overall balance assessment: Needs assistance;History of Falls                                           Pertinent Vitals/Pain Pain Assessment: Faces Faces Pain Scale: Hurts little more Pain Location: Rt hip dring weight bearing  Pain Descriptors / Indicators: Sore;Operative site guarding;Grimacing Pain Intervention(s): Limited activity within patient's tolerance;Monitored during session    Home Living Family/patient expects to be discharged to:: Skilled nursing facility                 Additional Comments: Plan is to return to [facility] to complete rehab s/p THA; pt was there for 2 days prior to return to [facility]    Prior Function Level of Independence: Needs assistance   Gait / Transfers Assistance Needed: Modified independent with SPC.  ADL's / Homemaking Assistance Needed: Assist with light meal prep, groceries, driving.  Comments: Active  riding stationary bike, walking.     Hand Dominance        Extremity/Trunk Assessment                Communication   Communication: HOH  Cognition Arousal/Alertness: Awake/alert Behavior During Therapy: WFL for tasks assessed/performed Overall Cognitive Status: Within Functional Limits for tasks assessed                                        General Comments      Exercises      Assessment/Plan    PT Assessment Patient needs continued PT services  PT Problem List Decreased strength;Decreased activity tolerance;Decreased balance;Decreased mobility;Decreased knowledge of use of DME;Decreased knowledge of precautions;Cardiopulmonary status limiting activity;Decreased range of motion       PT Treatment Interventions DME instruction;Gait training;Functional mobility training;Therapeutic activities;Therapeutic exercise;Balance training;Patient/family education    PT Goals (Current goals can be found in the Care Plan section)  Acute Rehab PT Goals Patient Stated Goal: to be able to walk again PT Goal Formulation: With patient Time For Goal Achievement: 12/24/16 Potential to Achieve Goals: Good    Frequency 7X/week   Barriers to discharge Decreased caregiver support      Co-evaluation               AM-PAC PT "6 Clicks" Daily Activity  Outcome Measure Difficulty turning over in bed (including adjusting bedclothes, sheets and blankets)?: Unable Difficulty moving from lying on back to sitting on the side of the bed? : Unable Difficulty sitting down on and standing up from a chair with arms (e.g., wheelchair, bedside commode, etc,.)?: Unable Help needed moving to and from a bed to chair (including a wheelchair)?: Total Help needed walking in hospital room?: Total Help needed climbing 3-5 steps with a railing? : Total 6 Click Score: 6    End of Session Equipment Utilized During Treatment: Oxygen Activity Tolerance: Patient limited by fatigue;Treatment limited secondary to medical complications (Comment) (tachycardia into 120s) Patient left: in bed;with call bell/phone within reach;with family/visitor present;with nursing/sitter in room Nurse Communication: Mobility status PT Visit Diagnosis: Other abnormalities of gait and mobility (R26.89);Muscle weakness (generalized) (M62.81);History of falling (Z91.81);Pain Pain - Right/Left: Right Pain - part of  body: Hip    Time: 1013-1026 PT Time Calculation (min) (ACUTE ONLY): 13 min   Charges:   PT Evaluation $PT Eval Moderate Complexity: 1 Mod     PT G Codes:   PT G-Codes **NOT FOR INPATIENT CLASS** Functional Assessment Tool Used: AM-PAC 6 Clicks Basic Mobility Functional Limitation: Mobility: Walking and moving around Mobility: Walking and Moving Around Current Status (Z6109): 100 percent impaired, limited or restricted Mobility: Walking and Moving Around Goal Status (U0454): 100 percent impaired, limited or restricted    10:49 AM, 12/10/16 Rosamaria Lints, PT, DPT Physical Therapist - [place] [phone] (ASCOM)  [phone] (mobile)    Kendel Pesnell C 12/10/2016, 10:43 AM

## 2016-12-10 NOTE — Progress Notes (Signed)
*  PRELIMINARY RESULTS* Echocardiogram 2D Echocardiogram has been performed.  [name] S Kass Herberger 12/10/2016, 9:50 AM 

## 2016-12-10 NOTE — Clinical Social Work Note (Signed)
CSW received consult that patient is from [facility]. CSW will assess when able.  Argentina Ponder, MSW, Theresia Majors [phone]

## 2016-12-10 NOTE — Clinical Social Work Note (Signed)
CSW met with the patient and her niece/HCPOA [place] about discharge today back to [facility]. The family and patient are in agreement, and they are aware that the CSW has updated Sharyn Lull, admissions coordinator, that the patient is requesting a private room should one come available. The family and patient understand that she will return to her prior room via non-emergent EMS and are in agreement. CSW has delivered the packet to the chart and alerted the attending MD. The CSW will continue to follow pending additional discharge needs.   Santiago Bumpers, MSW, Latanya Presser [phone]

## 2016-12-10 NOTE — Care Management Obs Status (Signed)
MEDICARE OBSERVATION STATUS NOTIFICATION   Patient Details  Name: Yvonne Edwards MRN: [id number] Date of Birth: 1918-04-30   Medicare Observation Status Notification Given:  No   Discharge within 24 hours    Shreyansh Tiffany A, RN 12/10/2016, 11:42 AM

## 2016-12-14 ENCOUNTER — Other Ambulatory Visit: Payer: Self-pay

## 2016-12-14 ENCOUNTER — Non-Acute Institutional Stay (SKILLED_NURSING_FACILITY): Payer: Medicare Other | Admitting: Gerontology

## 2016-12-14 ENCOUNTER — Encounter: Payer: Self-pay | Admitting: Gerontology

## 2016-12-14 DIAGNOSIS — S72001D Fracture of unspecified part of neck of right femur, subsequent encounter for closed fracture with routine healing: Secondary | ICD-10-CM | POA: Diagnosis not present

## 2016-12-14 MED ORDER — HYDROCODONE-ACETAMINOPHEN 5-325 MG PO TABS: 1.0000 | ORAL_TABLET | ORAL | 0 refills | Status: DC | PRN

## 2016-12-14 NOTE — Progress Notes (Signed)
Location:   [facility] Room Number: 208A Place of Service:  SNF [phone]) Provider:  Lorenso Quarry, NP-C  [name], MD  Patient Care Team: [name], MD as PCP - General (Internal Medicine)  Extended Emergency Contact Information Primary Emergency Contact: [name] Address: [address]          [place], [place] [address] [place] of [place] Home Phone: [phone] Work Phone: [phone] Relation: Friend Secondary Emergency Contact: Hebert Soho [place] Home Phone: [phone] Relation: Other  Code Status:  Full Goals of care: Advanced Directive information Advanced Directives 12/14/2016  Does Patient Have a Medical Advance Directive? No  Type of Advance Directive -  Does patient want to make changes to medical advance directive? -  Copy of Healthcare Power of Attorney in Chart? -     Chief Complaint  Patient presents with  . Hospitalization Follow-up    Follow up on hip surgery    HPI:  Yvonne Edwards is a 83 y.o. female seen today for a hospital f/u s/p admission from [facility] for Anterior Right Total Hip Arthroplasty resulting from fall with hip fracture. Yvonne Edwards has been participating with Yvonne Edwards and OT. She is doing well with this. She reports her pain is well controlled on current regimen. She reports fair to poor appetite, but states this is not unusual for her. She is voiding ok and having regular BMs. Incision well approximated, no redness, minimal drainage. Calves soft, supple. Negative Homan's sign. VSS. No other complaints.   Past Medical History:  Diagnosis Date  . Hearing loss   . Hyperlipidemia    unspecified  . Macular degeneration    Past Surgical History:  Procedure Laterality Date  . CHOLECYSTECTOMY  12/17/2012  . DILATION AND CURETTAGE, DIAGNOSTIC / THERAPEUTIC  1976  . TOTAL HIP ARTHROPLASTY Right 12/05/2016   Procedure: TOTAL HIP ARTHROPLASTY ANTERIOR APPROACH;  Surgeon: Kennedy Bucker, MD;  Location: [facility] ORS;   Service: Orthopedics;  Laterality: Right;    Allergies  Allergen Reactions  . Biaxin [Clarithromycin]   . Codeine Other (See Comments)  . Shellfish Allergy Nausea And Vomiting    Allergies as of 12/14/2016      Reactions   Biaxin [clarithromycin]    Codeine Other (See Comments)   Shellfish Allergy Nausea And Vomiting      Medication List       Accurate as of 12/14/16 12:38 PM. Always use your most recent med list.          acetaminophen 325 MG tablet Commonly known as:  TYLENOL Take 2 tablets (650 mg total) by mouth every 6 (six) hours as needed for mild pain (or Fever >/= 101).   calcium-vitamin D 500-200 MG-UNIT tablet Commonly known as:  OSCAL 500/200 D-3 Take 1 tablet by mouth 2 (two) times daily.   Cholecalciferol 4000 units Caps Take 1 capsule by mouth daily.   docusate sodium 100 MG capsule Commonly known as:  COLACE Take 1 capsule (100 mg total) by mouth 2 (two) times daily.   enoxaparin 30 MG/0.3ML injection Commonly known as:  LOVENOX Inject 0.3 mLs (30 mg total) into the skin daily. For 10 days days as previously prescribed.   HYDROcodone-acetaminophen 5-325 MG tablet Commonly known as:  NORCO/VICODIN Take 1-2 tablets by mouth every 4 (four) hours as needed for moderate pain or severe pain (breakthrough pain).   methocarbamol 500 MG tablet Commonly known as:  ROBAXIN Take 1 tablet (500 mg total) by mouth every 6 (six)  hours as needed for muscle spasms.   naproxen sodium 220 MG tablet Commonly known as:  ANAPROX Take 220 mg by mouth daily as needed.   PRESERVISION AREDS 2 PO Take 1 capsule by mouth daily.   rosuvastatin 10 MG tablet Commonly known as:  CRESTOR Take 10 mg by mouth daily.   senna 8.6 MG Tabs tablet Commonly known as:  SENOKOT Take 2 tablets by mouth 2 (two) times daily.       Review of Systems  Constitutional: Negative for activity change, appetite change, chills, diaphoresis and fever.  HENT: Negative for congestion,  sneezing, sore throat, trouble swallowing and voice change.   Respiratory: Negative for apnea, cough, choking, chest tightness, shortness of breath and wheezing.   Cardiovascular: Negative for chest pain, palpitations and leg swelling.  Gastrointestinal: Negative for abdominal distention, abdominal pain, constipation, diarrhea and nausea.  Genitourinary: Negative for difficulty urinating, dysuria, frequency and urgency.  Musculoskeletal: Positive for arthralgias (typical arthritis). Negative for back pain, gait problem and myalgias.  Skin: Positive for wound. Negative for color change, pallor and rash.  Neurological: Negative for dizziness, tremors, syncope, speech difficulty, weakness, numbness and headaches.  Psychiatric/Behavioral: Negative for agitation and behavioral problems.  All other systems reviewed and are negative.   Immunization History  Administered Date(s) Administered  . Influenza-Unspecified 01/24/2012, 01/22/2014, 01/21/2015, 01/13/2016  . PPD Test 12/07/2016  . Pneumococcal Conjugate-13 06/14/2016  . Pneumococcal Polysaccharide-23 12/18/2012  . Zoster 05/19/2009   Pertinent  Health Maintenance Due  Topic Date Due  . DEXA SCAN  10/26/1985  . PNA vac Low Risk Adult (1 of 2 - PCV13) 10/26/1985  . INFLUENZA VACCINE  11/16/2016   No flowsheet data found. Functional Status Survey:    Vitals:   12/14/16 1204  BP: (!) 110/42  Pulse: 88  Resp: 20  Temp: 98.3 F (36.8 C)  SpO2: 97%  Weight: 116 lb 14.4 oz (53 kg)  Height: 5' (1.524 m)   Body mass index is 22.83 kg/m. Physical Exam  Constitutional: She is oriented to person, place, and time. Vital signs are normal. She appears well-developed and well-nourished. She is active and cooperative. She does not appear ill. No distress.  HENT:  Head: Normocephalic and atraumatic.  Mouth/Throat: Uvula is midline, oropharynx is clear and moist and mucous membranes are normal. Mucous membranes are not pale, not dry and not  cyanotic.  Eyes: Pupils are equal, round, and reactive to light. Conjunctivae, EOM and lids are normal.  Neck: Trachea normal, normal range of motion and full passive range of motion without pain. Neck supple. No JVD present. No tracheal deviation, no edema and no erythema present. No thyromegaly present.  Cardiovascular: Normal rate, regular rhythm, normal heart sounds, intact distal pulses and normal pulses.  Exam reveals no gallop, no distant heart sounds and no friction rub.   No murmur heard. Pulses:      Dorsalis pedis pulses are 2+ on the right side, and 2+ on the left side.  Pulmonary/Chest: Effort normal and breath sounds normal. No accessory muscle usage. No respiratory distress. She has no decreased breath sounds. She has no wheezes. She has no rhonchi. She has no rales. She exhibits no tenderness.  Abdominal: Normal appearance and bowel sounds are normal. She exhibits no distension and no ascites. There is no tenderness.  Musculoskeletal: She exhibits no edema.       Right hip: She exhibits decreased range of motion, decreased strength and laceration.  Expected osteoarthritis, stiffness; calves soft, supple. Negative  Homan's sign.  Neurological: She is alert and oriented to person, place, and time. She has normal strength.  Skin: Skin is warm and dry. Laceration (right hip incision) noted. No rash noted. She is not diaphoretic. No cyanosis or erythema. No pallor. Nails show no clubbing.  Psychiatric: She has a normal mood and affect. Her speech is normal and behavior is normal. Judgment and thought content normal. Cognition and memory are normal.  Nursing note and vitals reviewed.   Labs reviewed:  Recent Labs  12/06/16 0411 12/07/16 0402 12/09/16 1426 12/10/16 0524  NA 134* 134* 130* 136  K 4.4  --  4.2 4.0  CL 105  --  92* 97*  CO2 24  --  30 32  GLUCOSE 90  --  150* 121*  BUN 22*  --  21* 23*  CREATININE 0.89  --  0.91 0.67  CALCIUM 7.5*  --  8.4* 8.7*    Recent  Labs  12/09/16 1426  AST 39  ALT 30  ALKPHOS 69  BILITOT 0.8  PROT 6.3*  ALBUMIN 2.9*    Recent Labs  12/04/16 1507  12/06/16 0411 12/07/16 0402 12/09/16 1426 12/10/16 0524  WBC 6.2  < > 10.2  --  10.2 6.6  NEUTROABS 4.3  --   --   --   --   --   HGB 14.1  < > 12.0  --  11.0* 10.5*  HCT 42.3  < > 35.4  --  32.6* 30.4*  MCV 89.1  < > 91.0  --  89.1 90.2  PLT 154  < > 119* 132* 191 185  < > = values in this interval not displayed. No results found for: TSH Lab Results  Component Value Date   HGBA1C 5.9 (H) 12/05/2016   Lab Results  Component Value Date   CHOL 102 12/09/2012   HDL 7 (L) 12/09/2012   LDLCALC 77 12/09/2012   TRIG 91 12/09/2012    Significant Diagnostic Results in last 30 days:  No results found.  Assessment/Plan 1. Closed fracture of right hip with routine healing, subsequent encounter  Continue Yvonne Edwards/OT  Continue exercises as taught by Yvonne Edwards/OT  Continue Lovenox 30 mg SQ Q Day for DVT prophylaxis  Continue Tylenol 650 mg po Q 6 hours pen for mild pain  Continue Hydrocodone/APAP 1-2 tablets po Q 4 hours prn pain  Continue ice pack prn for pain, edema  Skin, wound care per protocol  Follow up with Orthopedist as scheduled  Family/ staff Communication:   Total Time:  Documentation:  Face to Face:  Family/Phone:   Labs/tests ordered:    Brynda Rim, NP-C Geriatrics [facility] [address], [place] [address] Cell Phone (Mon-Fri 8am-5pm):  [phone] On Call:  [phone] & follow prompts after 5pm & weekends Office Phone:  [phone] Office Fax:  [phone]

## 2016-12-14 NOTE — Telephone Encounter (Signed)
Rx sent to [facility] phone : [phone] , fax : [phone]  

## 2016-12-17 ENCOUNTER — Encounter
Admission: RE | Admit: 2016-12-17 | Discharge: 2016-12-17 | Disposition: A | Discharge status: Home / Self Care | Payer: Medicare Other | Source: Ambulatory Visit | Attending: Internal Medicine | Admitting: Internal Medicine

## 2016-12-23 ENCOUNTER — Non-Acute Institutional Stay (SKILLED_NURSING_FACILITY): Payer: Medicare Other | Admitting: Gerontology

## 2016-12-23 ENCOUNTER — Encounter: Payer: Self-pay | Admitting: Gerontology

## 2016-12-23 DIAGNOSIS — S72001D Fracture of unspecified part of neck of right femur, subsequent encounter for closed fracture with routine healing: Secondary | ICD-10-CM | POA: Diagnosis not present

## 2016-12-23 DIAGNOSIS — T814XXA Infection following a procedure, initial encounter: Secondary | ICD-10-CM | POA: Diagnosis not present

## 2016-12-23 DIAGNOSIS — IMO0001 Reserved for inherently not codable concepts without codable children: Secondary | ICD-10-CM

## 2016-12-23 NOTE — Progress Notes (Signed)
Location:   [facility] Room Number: 208A Place of Service:  SNF [phone]) Provider:  Lorenso QuarryShannon Shikira Folino, NP-C  Barbette ReichmannHande, Vishwanath, MD  Patient Care Team: Barbette ReichmannHande, Vishwanath, MD as PCP - General (Internal Medicine)  Extended Emergency Contact Information Primary Emergency Contact: Trudee GripGilliam,Havoline Address: [address]          [place], [place] [address] [place] Home Phone: [phone] Work Phone: [phone] Relation: Friend Secondary Emergency Contact: Hebert SohoGilliam,Wes  [place] Home Phone: [phone] Relation: Other  Code Status:  FULL Goals of care: Advanced Directive information Advanced Directives 12/23/2016  Does Patient Have a Medical Advance Directive? No  Type of Advance Directive -  Does patient want to make changes to medical advance directive? -  Copy of Healthcare Power of Attorney in Chart? -     Chief Complaint  Patient presents with  . Medical Management of Chronic Issues    Routine Visit    HPI:  Yvonne Edwards is a 83 y.o. female seen today for f/u s/p [facility] for Anterior Right Total Hip Arthroplasty resulting from fall with hip fracture. Yvonne Edwards has been participating with Yvonne Edwards and OT. She is doing well with this. She reports her pain is well controlled on current regimen. She reports fair to poor appetite, but states this is not unusual for her. She is voiding ok and having regular BMs. Incision well approximated, moderate redness, minimal drainage, moderate warmth. Some induration. Calves soft, supple. Negative Homan's sign. VSS. No other complaints.     Past Medical History:  Diagnosis Date  . Hearing loss   . Hyperlipidemia    unspecified  . Macular degeneration    Past Surgical History:  Procedure Laterality Date  . CHOLECYSTECTOMY  12/17/2012  . DILATION AND CURETTAGE, DIAGNOSTIC / THERAPEUTIC  1976  . TOTAL HIP ARTHROPLASTY Right 12/05/2016   Procedure: TOTAL HIP ARTHROPLASTY ANTERIOR APPROACH;  Surgeon: Kennedy BuckerMenz,  Michael, MD;  Location: [facility] ORS;  Service: Orthopedics;  Laterality: Right;    Allergies  Allergen Reactions  . Biaxin [Clarithromycin]   . Codeine Other (See Comments)  . Shellfish Allergy Nausea And Vomiting    Allergies as of 12/23/2016      Reactions   Biaxin [clarithromycin]    Codeine Other (See Comments)   Shellfish Allergy Nausea And Vomiting      Medication List       Accurate as of 12/23/16 12:22 PM. Always use your most recent med list.          acetaminophen 325 MG tablet Commonly known as:  TYLENOL Take 2 tablets (650 mg total) by mouth every 6 (six) hours as needed for mild pain (or Fever >/= 101).   calcium-vitamin D 500-200 MG-UNIT tablet Commonly known as:  OSCAL 500/200 D-3 Take 1 tablet by mouth 2 (two) times daily.   Cholecalciferol 4000 units Caps Take 1 capsule by mouth daily.   docusate sodium 100 MG capsule Commonly known as:  COLACE Take 1 capsule (100 mg total) by mouth 2 (two) times daily.   enoxaparin 30 MG/0.3ML injection Commonly known as:  LOVENOX Inject 0.3 mLs (30 mg total) into the skin daily. For 10 days days as previously prescribed.   HYDROcodone-acetaminophen 5-325 MG tablet Commonly known as:  NORCO/VICODIN Take 1-2 tablets by mouth every 4 (four) hours as needed for moderate pain or severe pain (breakthrough pain).   methocarbamol 500 MG tablet Commonly known as:  ROBAXIN Take 1 tablet (500 mg total) by  mouth every 6 (six) hours as needed for muscle spasms.   naproxen sodium 220 MG tablet Commonly known as:  ANAPROX Take 220 mg by mouth daily as needed.   PRESERVISION AREDS 2 PO Take 1 capsule by mouth daily.   rosuvastatin 10 MG tablet Commonly known as:  CRESTOR Take 10 mg by mouth daily.   senna 8.6 MG Tabs tablet Commonly known as:  SENOKOT Take 2 tablets by mouth 2 (two) times daily.       Review of Systems  Constitutional: Negative for activity change, appetite change, chills, diaphoresis and fever.    HENT: Negative for congestion, sneezing, sore throat, trouble swallowing and voice change.   Respiratory: Negative for apnea, cough, choking, chest tightness, shortness of breath and wheezing.   Cardiovascular: Negative for chest pain, palpitations and leg swelling.  Gastrointestinal: Negative for abdominal distention, abdominal pain, constipation, diarrhea and nausea.  Genitourinary: Negative for difficulty urinating, dysuria, frequency and urgency.  Musculoskeletal: Positive for arthralgias (typical arthritis). Negative for back pain, gait problem and myalgias.  Skin: Positive for wound. Negative for color change, pallor and rash.  Neurological: Negative for dizziness, tremors, syncope, speech difficulty, weakness, numbness and headaches.  Psychiatric/Behavioral: Negative for agitation and behavioral problems.  All other systems reviewed and are negative.   Immunization History  Administered Date(s) Administered  . Influenza-Unspecified 01/24/2012, 01/22/2014, 01/21/2015, 01/13/2016  . PPD Test 12/07/2016  . Pneumococcal Conjugate-13 06/14/2016  . Pneumococcal Polysaccharide-23 12/18/2012  . Zoster 05/19/2009   Pertinent  Health Maintenance Due  Topic Date Due  . DEXA SCAN  10/26/1985  . INFLUENZA VACCINE  11/16/2016  . PNA vac Low Risk Adult  Completed   No flowsheet data found. Functional Status Survey:    Vitals:   12/23/16 1219  BP: (!) 103/43  Pulse: 80  Resp: 18  Temp: 97.8 F (36.6 C)  SpO2: 98%  Weight: 103 lb (46.7 kg)  Height: 5' (1.524 m)   Body mass index is 20.12 kg/m. Physical Exam  Constitutional: She is oriented to person, place, and time. Vital signs are normal. She appears well-developed and well-nourished. She is active and cooperative. She does not appear ill. No distress.  HENT:  Head: Normocephalic and atraumatic.  Mouth/Throat: Uvula is midline, oropharynx is clear and moist and mucous membranes are normal. Mucous membranes are not pale, not  dry and not cyanotic.  Eyes: Pupils are equal, round, and reactive to light. Conjunctivae, EOM and lids are normal.  Neck: Trachea normal, normal range of motion and full passive range of motion without pain. Neck supple. No JVD present. No tracheal deviation, no edema and no erythema present. No thyromegaly present.  Cardiovascular: Normal rate, regular rhythm, normal heart sounds, intact distal pulses and normal pulses.  Exam reveals no gallop, no distant heart sounds and no friction rub.   No murmur heard. Pulses:      Dorsalis pedis pulses are 2+ on the right side, and 2+ on the left side.  Pulmonary/Chest: Effort normal and breath sounds normal. No accessory muscle usage. No respiratory distress. She has no decreased breath sounds. She has no wheezes. She has no rhonchi. She has no rales. She exhibits no tenderness.  Abdominal: Normal appearance and bowel sounds are normal. She exhibits no distension and no ascites. There is no tenderness.  Musculoskeletal: She exhibits no edema.       Right hip: She exhibits decreased range of motion, decreased strength and laceration.  Expected osteoarthritis, stiffness; calves soft, supple. Negative Homan's  sign.  Neurological: She is alert and oriented to person, place, and time. She has normal strength.  Skin: Skin is warm and dry. Laceration (right hip incision) noted. No rash noted. She is not diaphoretic. There is erythema (incisional infection). No cyanosis. No pallor. Nails show no clubbing.  Psychiatric: She has a normal mood and affect. Her speech is normal and behavior is normal. Judgment and thought content normal. Cognition and memory are normal.  Nursing note and vitals reviewed.   Labs reviewed:  Recent Labs  12/06/16 0411 12/07/16 0402 12/09/16 1426 12/10/16 0524  NA 134* 134* 130* 136  K 4.4  --  4.2 4.0  CL 105  --  92* 97*  CO2 24  --  30 32  GLUCOSE 90  --  150* 121*  BUN 22*  --  21* 23*  CREATININE 0.89  --  0.91 0.67    CALCIUM 7.5*  --  8.4* 8.7*    Recent Labs  12/09/16 1426  AST 39  ALT 30  ALKPHOS 69  BILITOT 0.8  PROT 6.3*  ALBUMIN 2.9*    Recent Labs  12/04/16 1507  12/06/16 0411 12/07/16 0402 12/09/16 1426 12/10/16 0524  WBC 6.2  < > 10.2  --  10.2 6.6  NEUTROABS 4.3  --   --   --   --   --   HGB 14.1  < > 12.0  --  11.0* 10.5*  HCT 42.3  < > 35.4  --  32.6* 30.4*  MCV 89.1  < > 91.0  --  89.1 90.2  PLT 154  < > 119* 132* 191 185  < > = values in this interval not displayed. No results found for: TSH Lab Results  Component Value Date   HGBA1C 5.9 (H) 12/05/2016   Lab Results  Component Value Date   CHOL 102 12/09/2012   HDL 7 (L) 12/09/2012   LDLCALC 77 12/09/2012   TRIG 91 12/09/2012    Significant Diagnostic Results in last 30 days:  Dg Chest 1 View  Result Date: 12/04/2016 CLINICAL DATA:  Pain after fall. EXAM: CHEST 1 VIEW COMPARISON:  December 07, 2012 FINDINGS: Given the supine positioning, the cardiomediastinal silhouette is unchanged. No pneumothorax. No pulmonary nodules or masses. No focal infiltrates. IMPRESSION: No active disease. Electronically Signed   By: Gerome Sam III M.D   On: 12/04/2016 15:35   Ct Angio Chest Pe W Or Wo Contrast  Result Date: 12/09/2016 CLINICAL DATA:  Right hip surgery 5 days ago. Hypoxia. No shortness of breath or chest pain. EXAM: CT ANGIOGRAPHY CHEST WITH CONTRAST TECHNIQUE: Multidetector CT imaging of the chest was performed using the standard protocol during bolus administration of intravenous contrast. Multiplanar CT image reconstructions and MIPs were obtained to evaluate the vascular anatomy. CONTRAST:  Study 5 mL Isovue 370 COMPARISON:  None. FINDINGS: Cardiovascular: Satisfactory opacification of the pulmonary arteries to the segmental level. No evidence of pulmonary embolism. Normal heart size. No pericardial effusion. Normal caliber thoracic aorta. No thoracic aortic dissection. Thoracic aortic atherosclerosis.  Mediastinum/Nodes: No enlarged mediastinal, hilar, or axillary lymph nodes. Thyroid gland, trachea, and esophagus demonstrate no significant findings. Lungs/Pleura: Small bilateral pleural effusions. Mild peripheral interstitial prominence. Patchy areas of ground-glass opacity bilaterally. No pneumothorax. Upper Abdomen: No acute upper abdominal abnormality. Pneumobilia in the left hepatic lobe likely reflecting prior sphincterotomy. Musculoskeletal: No acute osseous abnormality. No aggressive osseous lesion. Review of the MIP images confirms the above findings. IMPRESSION: 1. No evidence of pulmonary  embolus. 2. Bilateral small pleural effusions and patchy areas of ground-glass concerning for mild pulmonary edema. 3.  Aortic Atherosclerosis (ICD10-170.0) Electronically Signed   By: Elige Ko   On: 12/09/2016 13:53   Dg Chest Port 1 View  Result Date: 12/09/2016 CLINICAL DATA:  Chest pain. EXAM: PORTABLE CHEST 1 VIEW COMPARISON:  Chest x-ray dated December 04, 2016. FINDINGS: The cardiomediastinal silhouette is normal in size. Atherosclerotic calcification of the aortic arch. Normal pulmonary vascularity. Small left pleural effusion. No focal consolidation or pneumothorax. Osteopenia. No acute osseous abnormality. IMPRESSION: Small left pleural effusion, unchanged. Electronically Signed   By: Obie Dredge M.D.   On: 12/09/2016 14:18   Dg Hip Operative Unilat W Or W/o Pelvis Right  Result Date: 12/05/2016 CLINICAL DATA:  Right hip replacement EXAM: OPERATIVE RIGHT HIP (WITH PELVIS IF PERFORMED) 3 VIEWS TECHNIQUE: Fluoroscopic spot image(s) were submitted for interpretation post-operatively. COMPARISON:  12/04/2016 FINDINGS: Multiple intraoperative spot images are submitted demonstrating changes of right hip replacement. Normal AP alignment. No visible hardware or bony complicating feature. IMPRESSION: Right hip replacement.  No visible complicating feature. Electronically Signed   By: Charlett Nose M.D.    On: 12/05/2016 13:09   Dg Hip Unilat W Or W/o Pelvis 2-3 Views Right  Result Date: 12/05/2016 CLINICAL DATA:  Status post right hip joint replacement. EXAM: DG HIP (WITH OR WITHOUT PELVIS) 2-3V RIGHT COMPARISON:  Preoperative study of December 04, 2016 FINDINGS: There has been interval placement of a prosthetic right hip joint. The interface with the native bone appears normal. No acute native bone abnormality is observed. There are vascular calcifications. There is soft tissue gas present at the surgical site. Surgical skin staples are present. IMPRESSION: No immediate postprocedure complication following right hip joint prosthesis placement. Electronically Signed   By: David  [place] M.D.   On: 12/05/2016 14:07   Dg Hip Unilat With Pelvis 2-3 Views Right  Result Date: 12/04/2016 CLINICAL DATA:  Pain after fall. EXAM: DG HIP (WITH OR WITHOUT PELVIS) 2-3V RIGHT COMPARISON:  None. FINDINGS: There is a fracture through the right femoral neck with mild displacement. No evidence of femoral head dislocation. No other fractures are identified. IMPRESSION: Fracture through the right femoral neck. Electronically Signed   By: Gerome Sam III M.D   On: 12/04/2016 15:34    Assessment/Plan 1. Closed fracture of right hip with routine healing, subsequent encounter  Continue Yvonne Edwards/OT  Continue exercises as taught by Yvonne Edwards/OT  Continue Lovenox 30 mg SQ Q Day for DVT prophylaxis  Continue Tylenol 650 mg po Q 6 hours pen for mild pain  Continue Hydrocodone/APAP 1-2 tablets po Q 4 hours prn pain  Continue ice pack prn for pain, edema  Skin, wound care per protocol  Follow up with Orthopedist as scheduled  2. Incisional infection, initial encounter  Keflex 500 mg po Q 6 hours x 7 days  Family/ staff Communication:   Total Time:  Documentation:  Face to Face:  Family/Phone:   Labs/tests ordered:    Medication list reviewed and assessed for continued appropriateness. Monthly medication orders  reviewed and signed.  Brynda Rim, NP-C Geriatrics [facility] [phone], [place] [address] Cell Phone (Mon-Fri 8am-5pm):  [phone] On Call:  [phone] & follow prompts after 5pm & weekends Office Phone:  [phone] Office Fax:  [phone]

## 2016-12-29 ENCOUNTER — Ambulatory Visit: Payer: Medicare Other | Admitting: Podiatry

## 2016-12-29 ENCOUNTER — Non-Acute Institutional Stay (SKILLED_NURSING_FACILITY): Payer: Medicare Other | Admitting: Gerontology

## 2016-12-29 ENCOUNTER — Encounter: Payer: Self-pay | Admitting: Gerontology

## 2016-12-29 DIAGNOSIS — IMO0001 Reserved for inherently not codable concepts without codable children: Secondary | ICD-10-CM

## 2016-12-29 DIAGNOSIS — S72001D Fracture of unspecified part of neck of right femur, subsequent encounter for closed fracture with routine healing: Secondary | ICD-10-CM | POA: Diagnosis not present

## 2016-12-29 DIAGNOSIS — N3281 Overactive bladder: Secondary | ICD-10-CM | POA: Diagnosis not present

## 2016-12-29 DIAGNOSIS — T814XXA Infection following a procedure, initial encounter: Secondary | ICD-10-CM

## 2016-12-29 NOTE — Progress Notes (Addendum)
Location:   [facility] Room Number: 208A Place of Service:  SNF [phone]) Provider:  Lorenso Quarry, NP-C  [name], MD  Patient Care Team: [name], MD as PCP - General (Internal Medicine)  Extended Emergency Contact Information Primary Emergency Contact: [name] Address: [address]          [place], [place] [address] [place] Home Phone: [phone] Work Phone: [phone] Relation: Friend Secondary Emergency Contact: Hebert Soho [place] of [place] Home Phone: [phone] Relation: Other  Code Status:  FULL Goals of care: Advanced Directive information Advanced Directives 12/29/2016  Does Patient Have a Medical Advance Directive? No  Type of Advance Directive -  Does patient want to make changes to medical advance directive? -  Copy of Healthcare Power of Attorney in Chart? -     Chief Complaint  Patient presents with  . Medical Management of Chronic Issues    Routine Visit    HPI:  Pt is a 83 y.o. female seen today for f/u s/p admission from [facility] for Anterior Right Total Hip Arthroplasty resulting from fall with hip fracture. Pt has been participating with PT and OT. She is doing well with this. She reports her pain is well controlled on current regimen. She reports fair to poor appetite, but states this is not unusual for her. She is voiding ok and having regular BMs. Incision well approximated. Last week, had moderate redness, minimal drainage, moderate warmth. Some induration. Significant improvement today. Calves soft, supple. Negative Homan's sign. Pt also c/o overactive bladder, worse at night. She reports she has been waking up 3-4 times/ night for a while to void. She reports she has tried oxybutinin but could not tolerate the dry mouth. Pt would like to try Myrbetriq. VSS. No other complaints.      Past Medical History:  Diagnosis Date  . Hearing loss   . Hyperlipidemia    unspecified  . Macular  degeneration    Past Surgical History:  Procedure Laterality Date  . CHOLECYSTECTOMY  12/17/2012  . DILATION AND CURETTAGE, DIAGNOSTIC / THERAPEUTIC  1976  . TOTAL HIP ARTHROPLASTY Right 12/05/2016   Procedure: TOTAL HIP ARTHROPLASTY ANTERIOR APPROACH;  Surgeon: Kennedy Bucker, MD;  Location: [facility] ORS;  Service: Orthopedics;  Laterality: Right;    Allergies  Allergen Reactions  . Biaxin [Clarithromycin]   . Codeine Other (See Comments)  . Shellfish Allergy Nausea And Vomiting    Allergies as of 12/29/2016      Reactions   Biaxin [clarithromycin]    Codeine Other (See Comments)   Shellfish Allergy Nausea And Vomiting      Medication List       Accurate as of 12/29/16  2:57 PM. Always use your most recent med list.          acetaminophen 325 MG tablet Commonly known as:  TYLENOL Take 2 tablets (650 mg total) by mouth every 6 (six) hours as needed for mild pain (or Fever >/= 101).   calcium-vitamin D 500-200 MG-UNIT tablet Commonly known as:  OSCAL 500/200 D-3 Take 1 tablet by mouth 2 (two) times daily.   cephALEXin 500 MG capsule Commonly known as:  KEFLEX Take 500 mg by mouth every 6 (six) hours.   Cholecalciferol 4000 units Caps Take 1 capsule by mouth daily.   docusate sodium 100 MG capsule Commonly known as:  COLACE Take 1 capsule (100 mg total) by mouth 2 (two) times daily.   ENSURE ENLIVE PO Take  1 Bottle by mouth 2 (two) times daily.   NUTRITIONAL SUPPLEMENT Liqd Give Magic Cup by mouth 2 times daily with lunch and supper trays for nutritional support.   HYDROcodone-acetaminophen 5-325 MG tablet Commonly known as:  NORCO/VICODIN Take 1-2 tablets by mouth every 4 (four) hours as needed for moderate pain or severe pain (breakthrough pain).   methocarbamol 500 MG tablet Commonly known as:  ROBAXIN Take 1 tablet (500 mg total) by mouth every 6 (six) hours as needed for muscle spasms.   mirtazapine 7.5 MG tablet Commonly known as:  REMERON Take 7.5 mg by  mouth at bedtime.   naproxen sodium 220 MG tablet Commonly known as:  ANAPROX Take 220 mg by mouth daily as needed.   PRESERVISION AREDS 2 PO Take 1 capsule by mouth daily.   rosuvastatin 10 MG tablet Commonly known as:  CRESTOR Take 10 mg by mouth daily.       Review of Systems  Constitutional: Negative for activity change, appetite change, chills, diaphoresis and fever.  HENT: Negative for congestion, sneezing, sore throat, trouble swallowing and voice change.   Respiratory: Negative for apnea, cough, choking, chest tightness, shortness of breath and wheezing.   Cardiovascular: Negative for chest pain, palpitations and leg swelling.  Gastrointestinal: Negative for abdominal distention, abdominal pain, constipation, diarrhea and nausea.  Genitourinary: Negative for difficulty urinating, dysuria, frequency and urgency.  Musculoskeletal: Positive for arthralgias (typical arthritis). Negative for back pain, gait problem and myalgias.  Skin: Positive for wound. Negative for color change, pallor and rash.  Neurological: Negative for dizziness, tremors, syncope, speech difficulty, weakness, numbness and headaches.  Psychiatric/Behavioral: Negative for agitation and behavioral problems.  All other systems reviewed and are negative.   Immunization History  Administered Date(s) Administered  . Influenza-Unspecified 01/24/2012, 01/22/2014, 01/21/2015, 01/13/2016  . PPD Test 12/07/2016  . Pneumococcal Conjugate-13 06/14/2016  . Pneumococcal Polysaccharide-23 12/18/2012  . Zoster 05/19/2009   Pertinent  Health Maintenance Due  Topic Date Due  . DEXA SCAN  10/26/1985  . INFLUENZA VACCINE  11/16/2016  . PNA vac Low Risk Adult  Completed   No flowsheet data found. Functional Status Survey:    Vitals:   12/29/16 1430  BP: (!) 122/50  Pulse: 83  Resp: 19  Temp: 97.9 F (36.6 C)  SpO2: 98%  Weight: 104 lb (47.2 kg)  Height: 5' (1.524 m)   Body mass index is 20.31  kg/m. Physical Exam  Constitutional: She is oriented to person, place, and time. Vital signs are normal. She appears well-developed and well-nourished. She is active and cooperative. She does not appear ill. No distress.  HENT:  Head: Normocephalic and atraumatic.  Mouth/Throat: Uvula is midline, oropharynx is clear and moist and mucous membranes are normal. Mucous membranes are not pale, not dry and not cyanotic.  Eyes: Pupils are equal, round, and reactive to light. Conjunctivae, EOM and lids are normal.  Neck: Trachea normal, normal range of motion and full passive range of motion without pain. Neck supple. No JVD present. No tracheal deviation, no edema and no erythema present. No thyromegaly present.  Cardiovascular: Normal rate, regular rhythm, normal heart sounds, intact distal pulses and normal pulses.  Exam reveals no gallop, no distant heart sounds and no friction rub.   No murmur heard. Pulses:      Dorsalis pedis pulses are 2+ on the right side, and 2+ on the left side.  Pulmonary/Chest: Effort normal and breath sounds normal. No accessory muscle usage. No respiratory distress. She has  no decreased breath sounds. She has no wheezes. She has no rhonchi. She has no rales. She exhibits no tenderness.  Abdominal: Normal appearance and bowel sounds are normal. She exhibits no distension and no ascites. There is no tenderness.  Musculoskeletal: She exhibits no edema.       Right hip: She exhibits decreased range of motion, decreased strength and laceration.  Expected osteoarthritis, stiffness; calves soft, supple. Negative Homan's sign.  Neurological: She is alert and oriented to person, place, and time. She has normal strength.  Skin: Skin is warm and dry. Laceration (right hip incision) noted. No rash noted. She is not diaphoretic. No cyanosis or erythema (incisional infection- improved). No pallor. Nails show no clubbing.  Psychiatric: She has a normal mood and affect. Her speech is  normal and behavior is normal. Judgment and thought content normal. Cognition and memory are normal.  Nursing note and vitals reviewed.   Labs reviewed:  Recent Labs  12/06/16 0411 12/07/16 0402 12/09/16 1426 12/10/16 0524  NA 134* 134* 130* 136  K 4.4  --  4.2 4.0  CL 105  --  92* 97*  CO2 24  --  30 32  GLUCOSE 90  --  150* 121*  BUN 22*  --  21* 23*  CREATININE 0.89  --  0.91 0.67  CALCIUM 7.5*  --  8.4* 8.7*    Recent Labs  12/09/16 1426  AST 39  ALT 30  ALKPHOS 69  BILITOT 0.8  PROT 6.3*  ALBUMIN 2.9*    Recent Labs  12/04/16 1507  12/06/16 0411 12/07/16 0402 12/09/16 1426 12/10/16 0524  WBC 6.2  < > 10.2  --  10.2 6.6  NEUTROABS 4.3  --   --   --   --   --   HGB 14.1  < > 12.0  --  11.0* 10.5*  HCT 42.3  < > 35.4  --  32.6* 30.4*  MCV 89.1  < > 91.0  --  89.1 90.2  PLT 154  < > 119* 132* 191 185  < > = values in this interval not displayed. No results found for: TSH Lab Results  Component Value Date   HGBA1C 5.9 (H) 12/05/2016   Lab Results  Component Value Date   CHOL 102 12/09/2012   HDL 7 (L) 12/09/2012   LDLCALC 77 12/09/2012   TRIG 91 12/09/2012    Significant Diagnostic Results in last 30 days:  Dg Chest 1 View  Result Date: 12/04/2016 CLINICAL DATA:  Pain after fall. EXAM: CHEST 1 VIEW COMPARISON:  December 07, 2012 FINDINGS: Given the supine positioning, the cardiomediastinal silhouette is unchanged. No pneumothorax. No pulmonary nodules or masses. No focal infiltrates. IMPRESSION: No active disease. Electronically Signed   By: Gerome Samavid  Williams III M.D   On: 12/04/2016 15:35   Ct Angio Chest Pe W Or Wo Contrast  Result Date: 12/09/2016 CLINICAL DATA:  Right hip surgery 5 days ago. Hypoxia. No shortness of breath or chest pain. EXAM: CT ANGIOGRAPHY CHEST WITH CONTRAST TECHNIQUE: Multidetector CT imaging of the chest was performed using the standard protocol during bolus administration of intravenous contrast. Multiplanar CT image  reconstructions and MIPs were obtained to evaluate the vascular anatomy. CONTRAST:  Study 5 mL Isovue 370 COMPARISON:  None. FINDINGS: Cardiovascular: Satisfactory opacification of the pulmonary arteries to the segmental level. No evidence of pulmonary embolism. Normal heart size. No pericardial effusion. Normal caliber thoracic aorta. No thoracic aortic dissection. Thoracic aortic atherosclerosis. Mediastinum/Nodes: No enlarged mediastinal,  hilar, or axillary lymph nodes. Thyroid gland, trachea, and esophagus demonstrate no significant findings. Lungs/Pleura: Small bilateral pleural effusions. Mild peripheral interstitial prominence. Patchy areas of ground-glass opacity bilaterally. No pneumothorax. Upper Abdomen: No acute upper abdominal abnormality. Pneumobilia in the left hepatic lobe likely reflecting prior sphincterotomy. Musculoskeletal: No acute osseous abnormality. No aggressive osseous lesion. Review of the MIP images confirms the above findings. IMPRESSION: 1. No evidence of pulmonary embolus. 2. Bilateral small pleural effusions and patchy areas of ground-glass concerning for mild pulmonary edema. 3.  Aortic Atherosclerosis (ICD10-170.0) Electronically Signed   By: Elige Ko   On: 12/09/2016 13:53   Dg Chest Port 1 View  Result Date: 12/09/2016 CLINICAL DATA:  Chest pain. EXAM: PORTABLE CHEST 1 VIEW COMPARISON:  Chest x-ray dated December 04, 2016. FINDINGS: The cardiomediastinal silhouette is normal in size. Atherosclerotic calcification of the aortic arch. Normal pulmonary vascularity. Small left pleural effusion. No focal consolidation or pneumothorax. Osteopenia. No acute osseous abnormality. IMPRESSION: Small left pleural effusion, unchanged. Electronically Signed   By: Obie Dredge M.D.   On: 12/09/2016 14:18   Dg Hip Operative Unilat W Or W/o Pelvis Right  Result Date: 12/05/2016 CLINICAL DATA:  Right hip replacement EXAM: OPERATIVE RIGHT HIP (WITH PELVIS IF PERFORMED) 3 VIEWS  TECHNIQUE: Fluoroscopic spot image(s) were submitted for interpretation post-operatively. COMPARISON:  12/04/2016 FINDINGS: Multiple intraoperative spot images are submitted demonstrating changes of right hip replacement. Normal AP alignment. No visible hardware or bony complicating feature. IMPRESSION: Right hip replacement.  No visible complicating feature. Electronically Signed   By: Charlett Nose M.D.   On: 12/05/2016 13:09   Dg Hip Unilat W Or W/o Pelvis 2-3 Views Right  Result Date: 12/05/2016 CLINICAL DATA:  Status post right hip joint replacement. EXAM: DG HIP (WITH OR WITHOUT PELVIS) 2-3V RIGHT COMPARISON:  Preoperative study of December 04, 2016 FINDINGS: There has been interval placement of a prosthetic right hip joint. The interface with the native bone appears normal. No acute native bone abnormality is observed. There are vascular calcifications. There is soft tissue gas present at the surgical site. Surgical skin staples are present. IMPRESSION: No immediate postprocedure complication following right hip joint prosthesis placement. Electronically Signed   By: David  [place] M.D.   On: 12/05/2016 14:07   Dg Hip Unilat With Pelvis 2-3 Views Right  Result Date: 12/04/2016 CLINICAL DATA:  Pain after fall. EXAM: DG HIP (WITH OR WITHOUT PELVIS) 2-3V RIGHT COMPARISON:  None. FINDINGS: There is a fracture through the right femoral neck with mild displacement. No evidence of femoral head dislocation. No other fractures are identified. IMPRESSION: Fracture through the right femoral neck. Electronically Signed   By: Gerome Sam III M.D   On: 12/04/2016 15:34    Assessment/Plan 1. Closed fracture of right hip with routine healing, subsequent encounter  Continue PT/OT  Continue exercises as taught by PT/OT  Continue Tylenol 650 mg po Q 6 hours pen for mild pain  Continue Hydrocodone/APAP 1-2 tablets po Q 4 hours prn pain  Continue ice pack prn for pain, edema  Skin, wound care per  protocol  Follow up with Orthopedist as scheduled  2. Incisional infection, initial encounter  Keflex 500 mg po Q 6 hours x 7 days- last day tomorrow  3. Overactive Bladder  Myrbetriq ER 25 mg po Q HS for nocturia  Family/ staff Communication:   Total Time:  Documentation:  Face to Face:  Family/Phone:   Labs/tests ordered:   Medication list reviewed and assessed for  continued appropriateness. Monthly medication orders reviewed and signed.  Vikki Ports, NP-C Geriatrics [facility] [phone], [place] [address] Cell Phone (Mon-Fri 8am-5pm):  [phone] On Call:  [phone] & follow prompts after 5pm & weekends Office Phone:  [phone] Office Fax:  [phone]

## 2017-01-03 ENCOUNTER — Encounter: Payer: Self-pay | Admitting: Gerontology

## 2017-01-03 ENCOUNTER — Non-Acute Institutional Stay (SKILLED_NURSING_FACILITY): Payer: Medicare Other | Admitting: Gerontology

## 2017-01-03 DIAGNOSIS — IMO0001 Reserved for inherently not codable concepts without codable children: Secondary | ICD-10-CM

## 2017-01-03 DIAGNOSIS — T814XXD Infection following a procedure, subsequent encounter: Secondary | ICD-10-CM

## 2017-01-03 DIAGNOSIS — N3281 Overactive bladder: Secondary | ICD-10-CM | POA: Diagnosis not present

## 2017-01-03 DIAGNOSIS — R350 Frequency of micturition: Secondary | ICD-10-CM | POA: Insufficient documentation

## 2017-01-03 DIAGNOSIS — S72001D Fracture of unspecified part of neck of right femur, subsequent encounter for closed fracture with routine healing: Secondary | ICD-10-CM

## 2017-01-03 NOTE — Progress Notes (Signed)
Location:   [facility] Room Number: 208A Place of Service:  SNF [phone])  Provider: Lorenso Quarry, NP-C  PCP: [name], MD Patient Care Team: [name], MD as PCP - General (Internal Medicine)  Extended Emergency Contact Information Primary Emergency Contact: [name] Address: [address]          [place], [place] [address] [place] Home Phone: [phone] Work Phone: [phone] Relation: Friend Secondary Emergency Contact: Hebert Soho [place] Home Phone: [phone] Relation: Other  Code Status: FULL Goals of care:  Advanced Directive information Advanced Directives 01/03/2017  Does Patient Have a Medical Advance Directive? No  Type of Advance Directive -  Does patient want to make changes to medical advance directive? -  Copy of Healthcare Power of Attorney in Chart? -     Allergies  Allergen Reactions  . Biaxin [Clarithromycin]   . Codeine Other (See Comments)  . Shellfish Allergy Nausea And Vomiting    Chief Complaint  Patient presents with  . Discharge Note    Discharged from SNF    HPI:  83 y.o. female seen today for discharge evaluation. Pt was admitted to the facility for rehab s/p admission from [facility] for Anterior Right Total Hip Arthroplasty resulting from fall with hip fracture. Pt has been participating with PT and OT. She is doing well with this. She reports her pain is well controlled on current regimen. She reports fair to poor appetite, but states this is not unusual for her. She is voiding ok and having regular BMs. Incision well approximated. Last week, had moderate redness, minimal drainage, moderate warmth. Some induration. Significant improvement today. Calves soft, supple. Negative Homan's sign. Pt also c/o overactive bladder, worse at night. She reports she has been waking up 3-4 times/ night for a while to void. She reports she has tried oxybutinin but could not tolerate the  dry mouth. Pt would like to try Myrbetriq. Pt has been tolerating this well, so far. VSS. No other complaints.     Past Medical History:  Diagnosis Date  . Hearing loss   . Hyperlipidemia    unspecified  . Macular degeneration     Past Surgical History:  Procedure Laterality Date  . CHOLECYSTECTOMY  12/17/2012  . DILATION AND CURETTAGE, DIAGNOSTIC / THERAPEUTIC  1976  . TOTAL HIP ARTHROPLASTY Right 12/05/2016   Procedure: TOTAL HIP ARTHROPLASTY ANTERIOR APPROACH;  Surgeon: Kennedy Bucker, MD;  Location: [facility] ORS;  Service: Orthopedics;  Laterality: Right;      reports that she has never smoked. She has never used smokeless tobacco. She reports that she does not drink alcohol or use drugs. Social History   Social History  . Marital status: Widowed    Spouse name: N/A  . Number of children: 0  . Years of education: Bachelor degree   Occupational History  . retired     Runner, broadcasting/film/video   Social History Main Topics  . Smoking status: Never Smoker  . Smokeless tobacco: Never Used  . Alcohol use No  . Drug use: No  . Sexual activity: Not on file   Other Topics Concern  . Not on file   Social History Narrative   Lives at home by herself. Currently living at [facility] rehabilitation after her right hip surgery from last week      Admitted to [facility] 12/07/2016 - 01/03/2017   Widowed   No children   Never smoker   No alcohol  Full Code   Functional Status Survey:    Allergies  Allergen Reactions  . Biaxin [Clarithromycin]   . Codeine Other (See Comments)  . Shellfish Allergy Nausea And Vomiting    Pertinent  Health Maintenance Due  Topic Date Due  . DEXA SCAN  10/26/1985  . INFLUENZA VACCINE  11/16/2016  . PNA vac Low Risk Adult  Completed    Medications: Allergies as of 01/03/2017      Reactions   Biaxin [clarithromycin]    Codeine Other (See Comments)   Shellfish Allergy Nausea And Vomiting      Medication List       Accurate as of 01/03/17  12:38 PM. Always use your most recent med list.          acetaminophen 325 MG tablet Commonly known as:  TYLENOL Take 2 tablets (650 mg total) by mouth every 6 (six) hours as needed for mild pain (or Fever >/= 101).   calcium-vitamin D 500-200 MG-UNIT tablet Commonly known as:  OSCAL 500/200 D-3 Take 1 tablet by mouth 2 (two) times daily.   Cholecalciferol 4000 units Caps Take 1 capsule by mouth daily.   docusate sodium 100 MG capsule Commonly known as:  COLACE Take 1 capsule (100 mg total) by mouth 2 (two) times daily.   ENSURE ENLIVE PO Take 1 Bottle by mouth 2 (two) times daily.   NUTRITIONAL SUPPLEMENT Liqd Give Magic Cup by mouth 2 times daily with lunch and supper trays for nutritional support.   HYDROcodone-acetaminophen 5-325 MG tablet Commonly known as:  NORCO/VICODIN Take 1-2 tablets by mouth every 4 (four) hours as needed for moderate pain or severe pain (breakthrough pain).   methocarbamol 500 MG tablet Commonly known as:  ROBAXIN Take 1 tablet (500 mg total) by mouth every 6 (six) hours as needed for muscle spasms.   mirtazapine 7.5 MG tablet Commonly known as:  REMERON Take 7.5 mg by mouth at bedtime.   naproxen sodium 220 MG tablet Commonly known as:  ANAPROX Take 220 mg by mouth daily as needed.   PRESERVISION AREDS 2 PO Take 1 capsule by mouth daily.   rosuvastatin 10 MG tablet Commonly known as:  CRESTOR Take 10 mg by mouth daily.       Review of Systems  Constitutional: Negative for activity change, appetite change, chills, diaphoresis and fever.  HENT: Negative for congestion, sneezing, sore throat, trouble swallowing and voice change.   Respiratory: Negative for apnea, cough, choking, chest tightness, shortness of breath and wheezing.   Cardiovascular: Negative for chest pain, palpitations and leg swelling.  Gastrointestinal: Negative for abdominal distention, abdominal pain, constipation, diarrhea and nausea.  Genitourinary: Negative for  difficulty urinating, dysuria, frequency and urgency.  Musculoskeletal: Positive for arthralgias (typical arthritis). Negative for back pain, gait problem and myalgias.  Skin: Positive for wound. Negative for color change, pallor and rash.  Neurological: Negative for dizziness, tremors, syncope, speech difficulty, weakness, numbness and headaches.  Psychiatric/Behavioral: Negative for agitation and behavioral problems.  All other systems reviewed and are negative.   Vitals:   01/03/17 1232  BP: (!) 117/54  Pulse: 71  Resp: 20  Temp: 97.8 F (36.6 C)  SpO2: 95%  Weight: 104 lb 8 oz (47.4 kg)  Height: 5' (1.524 m)   Body mass index is 20.41 kg/m. Physical Exam  Constitutional: She is oriented to person, place, and time. Vital signs are normal. She appears well-developed and well-nourished. She is active and cooperative. She does not appear ill. No  distress.  HENT:  Head: Normocephalic and atraumatic.  Mouth/Throat: Uvula is midline, oropharynx is clear and moist and mucous membranes are normal. Mucous membranes are not pale, not dry and not cyanotic.  Eyes: Pupils are equal, round, and reactive to light. Conjunctivae, EOM and lids are normal.  Neck: Trachea normal, normal range of motion and full passive range of motion without pain. Neck supple. No JVD present. No tracheal deviation, no edema and no erythema present. No thyromegaly present.  Cardiovascular: Normal rate, regular rhythm, normal heart sounds, intact distal pulses and normal pulses.  Exam reveals no gallop, no distant heart sounds and no friction rub.   No murmur heard. Pulses:      Dorsalis pedis pulses are 2+ on the right side, and 2+ on the left side.  Pulmonary/Chest: Effort normal and breath sounds normal. No accessory muscle usage. No respiratory distress. She has no decreased breath sounds. She has no wheezes. She has no rhonchi. She has no rales. She exhibits no tenderness.  Abdominal: Normal appearance and bowel  sounds are normal. She exhibits no distension and no ascites. There is no tenderness.  Musculoskeletal: She exhibits no edema.       Right hip: She exhibits decreased range of motion, decreased strength and laceration.  Expected osteoarthritis, stiffness; calves soft, supple. Negative Homan's sign.  Neurological: She is alert and oriented to person, place, and time. She has normal strength.  Skin: Skin is warm and dry. Laceration (right hip incision) noted. No rash noted. She is not diaphoretic. No cyanosis or erythema (incisional infection- improved). No pallor. Nails show no clubbing.  Psychiatric: She has a normal mood and affect. Her speech is normal and behavior is normal. Judgment and thought content normal. Cognition and memory are normal.  Nursing note and vitals reviewed.   Labs reviewed: Basic Metabolic Panel:  Recent Labs  16/10/96 0411 12/07/16 0402 12/09/16 1426 12/10/16 0524  NA 134* 134* 130* 136  K 4.4  --  4.2 4.0  CL 105  --  92* 97*  CO2 24  --  30 32  GLUCOSE 90  --  150* 121*  BUN 22*  --  21* 23*  CREATININE 0.89  --  0.91 0.67  CALCIUM 7.5*  --  8.4* 8.7*   Liver Function Tests:  Recent Labs  12/09/16 1426  AST 39  ALT 30  ALKPHOS 69  BILITOT 0.8  PROT 6.3*  ALBUMIN 2.9*   No results for input(s): LIPASE, AMYLASE in the last 8760 hours. No results for input(s): AMMONIA in the last 8760 hours. CBC:  Recent Labs  12/04/16 1507  12/06/16 0411 12/07/16 0402 12/09/16 1426 12/10/16 0524  WBC 6.2  < > 10.2  --  10.2 6.6  NEUTROABS 4.3  --   --   --   --   --   HGB 14.1  < > 12.0  --  11.0* 10.5*  HCT 42.3  < > 35.4  --  32.6* 30.4*  MCV 89.1  < > 91.0  --  89.1 90.2  PLT 154  < > 119* 132* 191 185  < > = values in this interval not displayed. Cardiac Enzymes:  Recent Labs  12/09/16 1825 12/10/16 0004 12/10/16 0524  TROPONINI <0.03 <0.03 <0.03   BNP: Invalid input(s): POCBNP CBG: No results for input(s): GLUCAP in the last 8760  hours.  Procedures and Imaging Studies During Stay: Dg Chest 1 View  Result Date: 12/04/2016 CLINICAL DATA:  Pain after fall.  EXAM: CHEST 1 VIEW COMPARISON:  December 07, 2012 FINDINGS: Given the supine positioning, the cardiomediastinal silhouette is unchanged. No pneumothorax. No pulmonary nodules or masses. No focal infiltrates. IMPRESSION: No active disease. Electronically Signed   By: Gerome Sam III M.D   On: 12/04/2016 15:35   Ct Angio Chest Pe W Or Wo Contrast  Result Date: 12/09/2016 CLINICAL DATA:  Right hip surgery 5 days ago. Hypoxia. No shortness of breath or chest pain. EXAM: CT ANGIOGRAPHY CHEST WITH CONTRAST TECHNIQUE: Multidetector CT imaging of the chest was performed using the standard protocol during bolus administration of intravenous contrast. Multiplanar CT image reconstructions and MIPs were obtained to evaluate the vascular anatomy. CONTRAST:  Study 5 mL Isovue 370 COMPARISON:  None. FINDINGS: Cardiovascular: Satisfactory opacification of the pulmonary arteries to the segmental level. No evidence of pulmonary embolism. Normal heart size. No pericardial effusion. Normal caliber thoracic aorta. No thoracic aortic dissection. Thoracic aortic atherosclerosis. Mediastinum/Nodes: No enlarged mediastinal, hilar, or axillary lymph nodes. Thyroid gland, trachea, and esophagus demonstrate no significant findings. Lungs/Pleura: Small bilateral pleural effusions. Mild peripheral interstitial prominence. Patchy areas of ground-glass opacity bilaterally. No pneumothorax. Upper Abdomen: No acute upper abdominal abnormality. Pneumobilia in the left hepatic lobe likely reflecting prior sphincterotomy. Musculoskeletal: No acute osseous abnormality. No aggressive osseous lesion. Review of the MIP images confirms the above findings. IMPRESSION: 1. No evidence of pulmonary embolus. 2. Bilateral small pleural effusions and patchy areas of ground-glass concerning for mild pulmonary edema. 3.  Aortic  Atherosclerosis (ICD10-170.0) Electronically Signed   By: Elige Ko   On: 12/09/2016 13:53   Dg Chest Port 1 View  Result Date: 12/09/2016 CLINICAL DATA:  Chest pain. EXAM: PORTABLE CHEST 1 VIEW COMPARISON:  Chest x-ray dated December 04, 2016. FINDINGS: The cardiomediastinal silhouette is normal in size. Atherosclerotic calcification of the aortic arch. Normal pulmonary vascularity. Small left pleural effusion. No focal consolidation or pneumothorax. Osteopenia. No acute osseous abnormality. IMPRESSION: Small left pleural effusion, unchanged. Electronically Signed   By: Obie Dredge M.D.   On: 12/09/2016 14:18   Dg Hip Operative Unilat W Or W/o Pelvis Right  Result Date: 12/05/2016 CLINICAL DATA:  Right hip replacement EXAM: OPERATIVE RIGHT HIP (WITH PELVIS IF PERFORMED) 3 VIEWS TECHNIQUE: Fluoroscopic spot image(s) were submitted for interpretation post-operatively. COMPARISON:  12/04/2016 FINDINGS: Multiple intraoperative spot images are submitted demonstrating changes of right hip replacement. Normal AP alignment. No visible hardware or bony complicating feature. IMPRESSION: Right hip replacement.  No visible complicating feature. Electronically Signed   By: Charlett Nose M.D.   On: 12/05/2016 13:09   Dg Hip Unilat W Or W/o Pelvis 2-3 Views Right  Result Date: 12/05/2016 CLINICAL DATA:  Status post right hip joint replacement. EXAM: DG HIP (WITH OR WITHOUT PELVIS) 2-3V RIGHT COMPARISON:  Preoperative study of December 04, 2016 FINDINGS: There has been interval placement of a prosthetic right hip joint. The interface with the native bone appears normal. No acute native bone abnormality is observed. There are vascular calcifications. There is soft tissue gas present at the surgical site. Surgical skin staples are present. IMPRESSION: No immediate postprocedure complication following right hip joint prosthesis placement. Electronically Signed   By: David  [place] M.D.   On: 12/05/2016 14:07   Dg Hip  Unilat With Pelvis 2-3 Views Right  Result Date: 12/04/2016 CLINICAL DATA:  Pain after fall. EXAM: DG HIP (WITH OR WITHOUT PELVIS) 2-3V RIGHT COMPARISON:  None. FINDINGS: There is a fracture through the right femoral neck with mild displacement.  No evidence of femoral head dislocation. No other fractures are identified. IMPRESSION: Fracture through the right femoral neck. Electronically Signed   By: Gerome Sam III M.D   On: 12/04/2016 15:34    Assessment/Plan:   1. Closed fracture of right hip with routine healing, subsequent encounter  Continue PT/OT  Continue exercises as taught by PT/OT  Continue Tylenol 650 mg po Q 6 hours prn for mild pain  Continue Hydrocodone/APAP 1-2 tablets po Q 4 hours prn pain  Continue ice pack prn for pain, edema  Skin, wound care per protocol  Follow up with Orthopedist asap after discharge for continuity of care  2. Incisional infection, initial encounter  Resolved  3. Overactive Bladder  Continue Myrbetriq ER 25 mg po Q HS for nocturia  Follow up with PCP asap after discharge for continuity of care/ medication management   Patient is being discharged with the following home health services:  HHPT/OT through [facility]  Patient is being discharged with the following durable medical equipment: RW, shower chair   Patient has been advised to f/u with their PCP in 1-2 weeks to bring them up to date on their rehab stay.  Social services at facility was responsible for arranging this appointment.  Pt was provided with a 30 day supply of prescriptions for medications and refills must be obtained from their PCP.  For controlled substances, a more limited supply may be provided adequate until PCP appointment only.  Future labs/tests needed:    Family/ staff Communication:   Total Time:  Documentation:  Face to Face:  Family/Phone:  Brynda Rim, NP-C [facility] [address] [place], [place] [address] Cell Phone (Mon-Fri 8am-5pm):  [phone] On Call:  [phone] & follow prompts after 5pm & weekends Office Phone:  [phone] Office Fax:  [phone]

## 2017-01-05 ENCOUNTER — Emergency Department
Admission: EM | Admit: 2017-01-05 | Discharge: 2017-01-05 | Disposition: A | Discharge status: Home / Self Care | Payer: Medicare Other | Attending: Emergency Medicine | Admitting: Emergency Medicine

## 2017-01-05 ENCOUNTER — Emergency Department: Payer: Medicare Other

## 2017-01-05 DIAGNOSIS — Z9889 Other specified postprocedural states: Secondary | ICD-10-CM | POA: Insufficient documentation

## 2017-01-05 DIAGNOSIS — Z4801 Encounter for change or removal of surgical wound dressing: Secondary | ICD-10-CM | POA: Diagnosis present

## 2017-01-05 DIAGNOSIS — Z96641 Presence of right artificial hip joint: Secondary | ICD-10-CM | POA: Insufficient documentation

## 2017-01-05 DIAGNOSIS — Z79899 Other long term (current) drug therapy: Secondary | ICD-10-CM | POA: Diagnosis not present

## 2017-01-05 DIAGNOSIS — W19XXXA Unspecified fall, initial encounter: Secondary | ICD-10-CM

## 2017-01-05 NOTE — ED Provider Notes (Signed)
[facility] Emergency Department Provider Note  ____________________________________________   First MD Initiated Contact with Patient 01/05/17 2024     (approximate)  I have reviewed the triage vital signs and the nursing notes.   HISTORY  Chief Complaint Fall   HPI Yvonne Edwards is a 83 y.o. female with a history of a recent right-sided hip replacement who is presenting to the emergency department today after a fall onto her left hip and buttock. She did not hit her head or lose consciousness. She says that she was walking with her walker when the walker when up on 2 wheels and fell over causing her to fall as well to her left. She says that her friends as well staff were concerned about her and wanted her to be evaluated further in the emergency department.   Past Medical History:  Diagnosis Date  . Hearing loss   . Hyperlipidemia    unspecified  . Macular degeneration     Patient Active Problem List   Diagnosis Date Noted  . Overactive bladder 01/03/2017  . Chest pain 12/09/2016  . S/P total hip arthroplasty 12/07/2016  . Hyponatremia 12/07/2016  . Hyperglycemia 12/07/2016  . Thrombocytopenia (HCC) 12/07/2016  . Leukocytosis 12/07/2016  . MRSA carrier 12/07/2016  . Vitamin D deficiency 12/07/2016  . Hip fracture (HCC) 12/04/2016    Past Surgical History:  Procedure Laterality Date  . CHOLECYSTECTOMY  12/17/2012  . DILATION AND CURETTAGE, DIAGNOSTIC / THERAPEUTIC  1976  . TOTAL HIP ARTHROPLASTY Right 12/05/2016   Procedure: TOTAL HIP ARTHROPLASTY ANTERIOR APPROACH;  Surgeon: Kennedy Bucker, MD;  Location: [facility] ORS;  Service: Orthopedics;  Laterality: Right;    Prior to Admission medications   Medication Sig Start Date End Date Taking? Authorizing Provider  acetaminophen (TYLENOL) 325 MG tablet Take 2 tablets (650 mg total) by mouth every 6 (six) hours as needed for mild pain (or Fever >/= 101). 12/07/16   Katharina Caper, MD    calcium-vitamin D (OSCAL 500/200 D-3) 500-200 MG-UNIT tablet Take 1 tablet by mouth 2 (two) times daily. 12/07/16 12/07/17  Katharina Caper, MD  Cholecalciferol 4000 units CAPS Take 1 capsule by mouth daily.    [provider]  docusate sodium (COLACE) 100 MG capsule Take 1 capsule (100 mg total) by mouth 2 (two) times daily. 12/07/16   Katharina Caper, MD  HYDROcodone-acetaminophen (NORCO/VICODIN) 5-325 MG tablet Take 1-2 tablets by mouth every 4 (four) hours as needed for moderate pain or severe pain (breakthrough pain). 12/14/16   Lorenso Quarry, NP  methocarbamol (ROBAXIN) 500 MG tablet Take 1 tablet (500 mg total) by mouth every 6 (six) hours as needed for muscle spasms. 12/07/16   Katharina Caper, MD  mirtazapine (REMERON) 7.5 MG tablet Take 7.5 mg by mouth at bedtime.    [provider]  Multiple Vitamins-Minerals (PRESERVISION AREDS 2 PO) Take 1 capsule by mouth daily.     [provider]  naproxen sodium (ANAPROX) 220 MG tablet Take 220 mg by mouth daily as needed.    [provider]  NUTRITIONAL SUPPLEMENT LIQD Give Magic Cup by mouth 2 times daily with lunch and supper trays for nutritional support.    [provider]  Nutritional Supplements (ENSURE ENLIVE PO) Take 1 Bottle by mouth 2 (two) times daily.    [provider]  rosuvastatin (CRESTOR) 10 MG tablet Take 10 mg by mouth daily.  04/06/15   [provider]    Allergies Biaxin [clarithromycin]; Codeine; and Shellfish  allergy  Family History  Problem Relation Age of Onset  . Aneurysm Mother   . Breast cancer Sister   . Ovarian cancer Sister   . CAD Sister   . Stroke Sister   . Lung cancer Brother   . Heart disease Father   . Stroke Father     Social History Social History  Substance Use Topics  . Smoking status: Never Smoker  . Smokeless tobacco: Never Used  . Alcohol use No    Review of Systems  Constitutional: No fever/chills Eyes: No visual  changes. ENT: No sore throat. Cardiovascular: Denies chest pain. Respiratory: Denies shortness of breath. Gastrointestinal: No abdominal pain.  No nausea, no vomiting.  No diarrhea.  No constipation. Genitourinary: Negative for dysuria. Musculoskeletal: Negative for back pain. Skin: Negative for rash. Neurological: Negative for headaches, focal weakness or numbness.   ____________________________________________   PHYSICAL EXAM:  VITAL SIGNS: ED Triage Vitals  Enc Vitals Group     BP      Pulse      Resp      Temp      Temp src      SpO2      Weight      Height      Head Circumference      Peak Flow      Pain Score      Pain Loc      Pain Edu?      Excl. in GC?     Constitutional: Alert and oriented. Well appearing and in no acute distress. Eyes: Conjunctivae are normal.  Head: Atraumatic. Nose: No congestion/rhinnorhea. Mouth/Throat: Mucous membranes are moist.  Neck: No stridor.   Cardiovascular: Normal rate, regular rhythm. Grossly normal heart sounds.  Good peripheral circulation with equal and bilateral dorsalis pedis pulses. Respiratory: Normal respiratory effort.  No retractions. Lungs CTAB. Gastrointestinal: Soft and nontender. No distention. No CVA tenderness. Musculoskeletal: No lower extremity tenderness nor edema.  No joint effusions. no extremity shortening to the bilateral lower extremities. No tenderness to the bilateral hips. 5 out of 5 strength to the left lower extremity with active flexion at the hip. However, the patient has a weak 4 out of 5 strength to the right lower extremity secondary to her recent hip replacement this past August. She says this is her baselinestrength on the right.  sensation is intact to light touch to bilateral lower extremities. There is a small area of dehiscence to the distal portion of the surgical incision. The dehisced portion is about 2 cm and only down to the superficial layers of tissue. There is no active bleeding.  No tenderness around the site of the incision. No pus drainage or fluctuance.  No ecchymosis to the pelvis of the bilateral hips or buttocks. There is no tenderness over the coccyx or the lumbar or sacral spines.  Neurologic:  Normal speech and language. No gross focal neurologic deficits are appreciated. Skin:  Skin is warm, dry and intact. No rash noted. Psychiatric: Mood and affect are normal. Speech and behavior are normal.  ____________________________________________   LABS (all labs ordered are listed, but only abnormal results are displayed)  Labs Reviewed - No data to display ____________________________________________  EKG   ____________________________________________  RADIOLOGY  no acute finding on the x-ray of the pelvis and left hip. ____________________________________________   PROCEDURES  Procedure(s) performed:   Procedures  Critical Care performed:   ____________________________________________   INITIAL IMPRESSION / ASSESSMENT AND PLAN / ED COURSE  Pertinent  labs & imaging results that were available during my care of the patient were reviewed by me and considered in my medical decision making (see chart for details).  DDX: Hip fracture, hip dislocation, coccyx fracture, pelvic fracture.  ----------------------------------------- 9:35 PM on 01/05/2017 -----------------------------------------  Patient is resting comfortable at this time. About return is now the bedside. The patient has no further complaints. We discussed the x-ray results. Unlikely to have an acute issue at this time. We will redress the incision to the right hip including Steri-Strips and a fresh overlying bandage. The palate return he says that the patient has had bleeding issues to the incision prior to tonight. Patient is scheduled to start physical therapy tomorrow which I think will be greatly beneficial. Patient will be discharged home. Mechanical fall per history.       ____________________________________________   FINAL CLINICAL IMPRESSION(S) / ED DIAGNOSES  fall.    NEW MEDICATIONS STARTED DURING THIS VISIT:  New Prescriptions   No medications on file     Note:  This document was prepared using Dragon voice recognition software and may include unintentional dictation errors.     Myrna Blazer, MD 01/05/17 2136

## 2017-01-05 NOTE — ED Triage Notes (Signed)
Pt arrived via EMS from [facility] [address]. Pt reports that she walking using her walker and started to lean over and she fell. She landed on her bottom and she called for help. Pt ambulatory using assistive devices. VS per EMS BP-145/74 HR-90s O2-94-95%RA. Pt reports pain in sacrum area. Pt reports having had a hip replacement back in August to right hip.

## 2017-01-12 NOTE — Care Management (Addendum)
Post discharge 01/12/17- Patient from [facility] and per Ortho office Lindsay-"they took [facility] VAC off without a doctor's order". [facility] is following patient. I spoke with Dorene Sorrow PT at [facility] and he agrees that [facility] was removed without an order by the facility. Wound is open as of last Tuesday per Lillia Abed- planning transfer to [facility] Per [facility] they do not have any beds and have no intentions of accepting this patient. Knee Surgery Dec 05, 2016- she went to SNF/[facility]- and was sent back to ALF with [facility] after surgery. FL2 signed per [facility]. Per Dr. Rosita Kea patient has to have a VAC per Lillia Abed and Dorene Sorrow with [facility]. Group meeting arranged by phone Olegario Messier, Tye with ALF, Dorene Sorrow with [facility] and Lillia Abed with Dr. Rosita Kea): RNCM contacted director at [facility]- Olena Heckle medical director- wound vac> [facility]- cannot have wound vac at their facility. Per Olegario Messier with ALF agrees that [facility] should remain on until orders to remove or patient transfer to SNF. Lupita Leash told Scarlett with ALF that "VAC could not be put back on".  ALF is seeking SNF bed at [facility]. In the mean time, per Olegario Messier, [facility] to come back to put [facility] back on patient.

## 2017-02-13 ENCOUNTER — Ambulatory Visit (INDEPENDENT_AMBULATORY_CARE_PROVIDER_SITE_OTHER): Payer: Medicare Other | Admitting: Podiatry

## 2017-02-13 DIAGNOSIS — M79609 Pain in unspecified limb: Secondary | ICD-10-CM

## 2017-02-13 DIAGNOSIS — M2010 Hallux valgus (acquired), unspecified foot: Secondary | ICD-10-CM

## 2017-02-13 DIAGNOSIS — B351 Tinea unguium: Secondary | ICD-10-CM

## 2017-02-13 DIAGNOSIS — M2041 Other hammer toe(s) (acquired), right foot: Secondary | ICD-10-CM

## 2017-02-13 DIAGNOSIS — M2042 Other hammer toe(s) (acquired), left foot: Secondary | ICD-10-CM

## 2017-02-13 NOTE — Progress Notes (Signed)
Complaint:  Visit Type: Patient returns to my office for continued preventative foot care services. Complaint: Patient states" my nails have grown long and thick and become painful to walk and wear shoes"  The patient presents for preventative foot care services. No changes to ROS  Podiatric Exam: Vascular: dorsalis pedis and posterior tibial pulses are palpable bilateral. Capillary return is immediate. Temperature gradient is WNL. Skin turgor WNL  Sensorium: Normal Semmes Weinstein monofilament test. Normal tactile sensation bilaterally. Nail Exam: Pt has thick disfigured discolored nails with subungual debris noted bilateral entire nail hallux through fifth toenails Ulcer Exam: There is no evidence of ulcer or pre-ulcerative changes or infection. Orthopedic Exam: Muscle tone and strength are WNL. No limitations in general ROM. No crepitus or effusions noted. Pes planus.  HAV with overlapping 2  B/L.  Hammer toes  B/L Skin: No Porokeratosis. No infection or ulcers  Diagnosis:  Onychomycosis, , Pain in right toe, pain in left toes  Treatment & Plan Procedures and Treatment: Consent by patient was obtained for treatment procedures. The patient understood the discussion of treatment and procedures well. All questions were answered thoroughly reviewed. Debridement of mycotic and hypertrophic toenails, 1 through 5 bilateral and clearing of subungual debris. No ulceration, no infection noted.  ABN signed for 2018. Return Visit-Office Procedure: Patient instructed to return to the office for a follow up visit 3 months for continued evaluation and treatment.    Helane GuntherGregory Jenee Spaugh DPM

## 2017-05-25 ENCOUNTER — Encounter: Payer: Self-pay | Admitting: Podiatry

## 2017-05-25 ENCOUNTER — Ambulatory Visit (INDEPENDENT_AMBULATORY_CARE_PROVIDER_SITE_OTHER): Payer: Medicare Other | Admitting: Podiatry

## 2017-05-25 DIAGNOSIS — B351 Tinea unguium: Secondary | ICD-10-CM | POA: Diagnosis not present

## 2017-05-25 DIAGNOSIS — M2010 Hallux valgus (acquired), unspecified foot: Secondary | ICD-10-CM

## 2017-05-25 DIAGNOSIS — M2042 Other hammer toe(s) (acquired), left foot: Secondary | ICD-10-CM

## 2017-05-25 DIAGNOSIS — M2041 Other hammer toe(s) (acquired), right foot: Secondary | ICD-10-CM

## 2017-05-25 DIAGNOSIS — M79609 Pain in unspecified limb: Secondary | ICD-10-CM

## 2017-05-25 NOTE — Progress Notes (Signed)
Complaint:  Visit Type: Patient returns to my office for continued preventative foot care services. Complaint: Patient states" my nails have grown long and thick and become painful to walk and wear shoes"  The patient presents for preventative foot care services. No changes to ROS  Podiatric Exam: Vascular: dorsalis pedis and posterior tibial pulses are palpable bilateral. Capillary return is immediate. Temperature gradient is WNL. Skin turgor WNL  Sensorium: Normal Semmes Weinstein monofilament test. Normal tactile sensation bilaterally. Nail Exam: Pt has thick disfigured discolored nails with subungual debris noted bilateral entire nail hallux through fifth toenails Ulcer Exam: There is no evidence of ulcer or pre-ulcerative changes or infection. Orthopedic Exam: Muscle tone and strength are WNL. No limitations in general ROM. No crepitus or effusions noted. Pes planus.  HAV with overlapping 2  B/L.  Hammer toes  B/L Skin: No Porokeratosis. No infection or ulcers  Diagnosis:  Onychomycosis, , Pain in right toe, pain in left toes  Treatment & Plan Procedures and Treatment: Consent by patient was obtained for treatment procedures. The patient understood the discussion of treatment and procedures well. All questions were answered thoroughly reviewed. Debridement of mycotic and hypertrophic toenails, 1 through 5 bilateral and clearing of subungual debris. No ulceration, no infection noted.  ABN signed for 2019. Return Visit-Office Procedure: Patient instructed to return to the office for a follow up visit 3 months for continued evaluation and treatment.    Javonta Gronau DPM 

## 2017-08-24 ENCOUNTER — Ambulatory Visit (INDEPENDENT_AMBULATORY_CARE_PROVIDER_SITE_OTHER): Payer: Medicare Other | Admitting: Podiatry

## 2017-08-24 ENCOUNTER — Encounter: Payer: Self-pay | Admitting: Podiatry

## 2017-08-24 DIAGNOSIS — M2042 Other hammer toe(s) (acquired), left foot: Secondary | ICD-10-CM

## 2017-08-24 DIAGNOSIS — M79609 Pain in unspecified limb: Secondary | ICD-10-CM | POA: Diagnosis not present

## 2017-08-24 DIAGNOSIS — B351 Tinea unguium: Secondary | ICD-10-CM | POA: Diagnosis not present

## 2017-08-24 DIAGNOSIS — M2010 Hallux valgus (acquired), unspecified foot: Secondary | ICD-10-CM

## 2017-08-24 DIAGNOSIS — M2041 Other hammer toe(s) (acquired), right foot: Secondary | ICD-10-CM

## 2017-08-24 NOTE — Progress Notes (Signed)
Complaint:  Visit Type: Patient returns to my office for continued preventative foot care services. Complaint: Patient states" my nails have grown long and thick and become painful to walk and wear shoes"  The patient presents for preventative foot care services. No changes to ROS  Podiatric Exam: Vascular: dorsalis pedis and posterior tibial pulses are palpable bilateral. Capillary return is immediate. Temperature gradient is WNL. Skin turgor WNL  Sensorium: Normal Semmes Weinstein monofilament test. Normal tactile sensation bilaterally. Nail Exam: Pt has thick disfigured discolored nails with subungual debris noted bilateral entire nail hallux through fifth toenails Ulcer Exam: There is no evidence of ulcer or pre-ulcerative changes or infection. Orthopedic Exam: Muscle tone and strength are WNL. No limitations in general ROM. No crepitus or effusions noted. Pes planus.  HAV with overlapping 2  B/L.  Hammer toes  B/L Skin: No Porokeratosis. No infection or ulcers  Diagnosis:  Onychomycosis, , Pain in right toe, pain in left toes  Treatment & Plan Procedures and Treatment: Consent by patient was obtained for treatment procedures. The patient understood the discussion of treatment and procedures well. All questions were answered thoroughly reviewed. Debridement of mycotic and hypertrophic toenails, 1 through 5 bilateral and clearing of subungual debris. No ulceration, no infection noted.  ABN signed for 2019. Return Visit-Office Procedure: Patient instructed to return to the office for a follow up visit 3 months for continued evaluation and treatment.    Blimi Godby DPM 

## 2017-11-30 ENCOUNTER — Encounter: Payer: Self-pay | Admitting: Podiatry

## 2017-11-30 ENCOUNTER — Ambulatory Visit (INDEPENDENT_AMBULATORY_CARE_PROVIDER_SITE_OTHER): Payer: Medicare Other | Admitting: Podiatry

## 2017-11-30 DIAGNOSIS — M2042 Other hammer toe(s) (acquired), left foot: Secondary | ICD-10-CM

## 2017-11-30 DIAGNOSIS — M79609 Pain in unspecified limb: Secondary | ICD-10-CM | POA: Diagnosis not present

## 2017-11-30 DIAGNOSIS — M2010 Hallux valgus (acquired), unspecified foot: Secondary | ICD-10-CM

## 2017-11-30 DIAGNOSIS — M2041 Other hammer toe(s) (acquired), right foot: Secondary | ICD-10-CM

## 2017-11-30 DIAGNOSIS — B351 Tinea unguium: Secondary | ICD-10-CM

## 2017-11-30 NOTE — Progress Notes (Signed)
Complaint:  Visit Type: Patient returns to my office for continued preventative foot care services. Complaint: Patient states" my nails have grown long and thick and become painful to walk and wear shoes"  The patient presents for preventative foot care services. No changes to ROS  Podiatric Exam: Vascular: dorsalis pedis and posterior tibial pulses are palpable bilateral. Capillary return is immediate. Temperature gradient is WNL. Skin turgor WNL  Sensorium: Normal Semmes Weinstein monofilament test. Normal tactile sensation bilaterally. Nail Exam: Pt has thick disfigured discolored nails with subungual debris noted bilateral entire nail hallux through fifth toenails Ulcer Exam: There is no evidence of ulcer or pre-ulcerative changes or infection. Orthopedic Exam: Muscle tone and strength are WNL. No limitations in general ROM. No crepitus or effusions noted. Pes planus.  HAV with overlapping 2  B/L.  Hammer toes  B/L Skin: No Porokeratosis. No infection or ulcers  Diagnosis:  Onychomycosis, , Pain in right toe, pain in left toes  Treatment & Plan Procedures and Treatment: Consent by patient was obtained for treatment procedures. The patient understood the discussion of treatment and procedures well. All questions were answered thoroughly reviewed. Debridement of mycotic and hypertrophic toenails, 1 through 5 bilateral and clearing of subungual debris. No ulceration, no infection noted.  ABN signed for 2019. Return Visit-Office Procedure: Patient instructed to return to the office for a follow up visit 3 months for continued evaluation and treatment.    Monee Dembeck DPM 

## 2018-01-21 IMAGING — CR DG HIP (WITH OR WITHOUT PELVIS) 2-3V*L*
3 series · 3 of 3 positions shown · non-contrast
Comparison: Single view of the pelvis and two views of the left hip
are provided.

CLINICAL DATA: Status post fall earlier today. Pain in sacrum area.

EXAM:
DG HIP (WITH OR WITHOUT PELVIS) 2-3V LEFT

[pelvis ap]
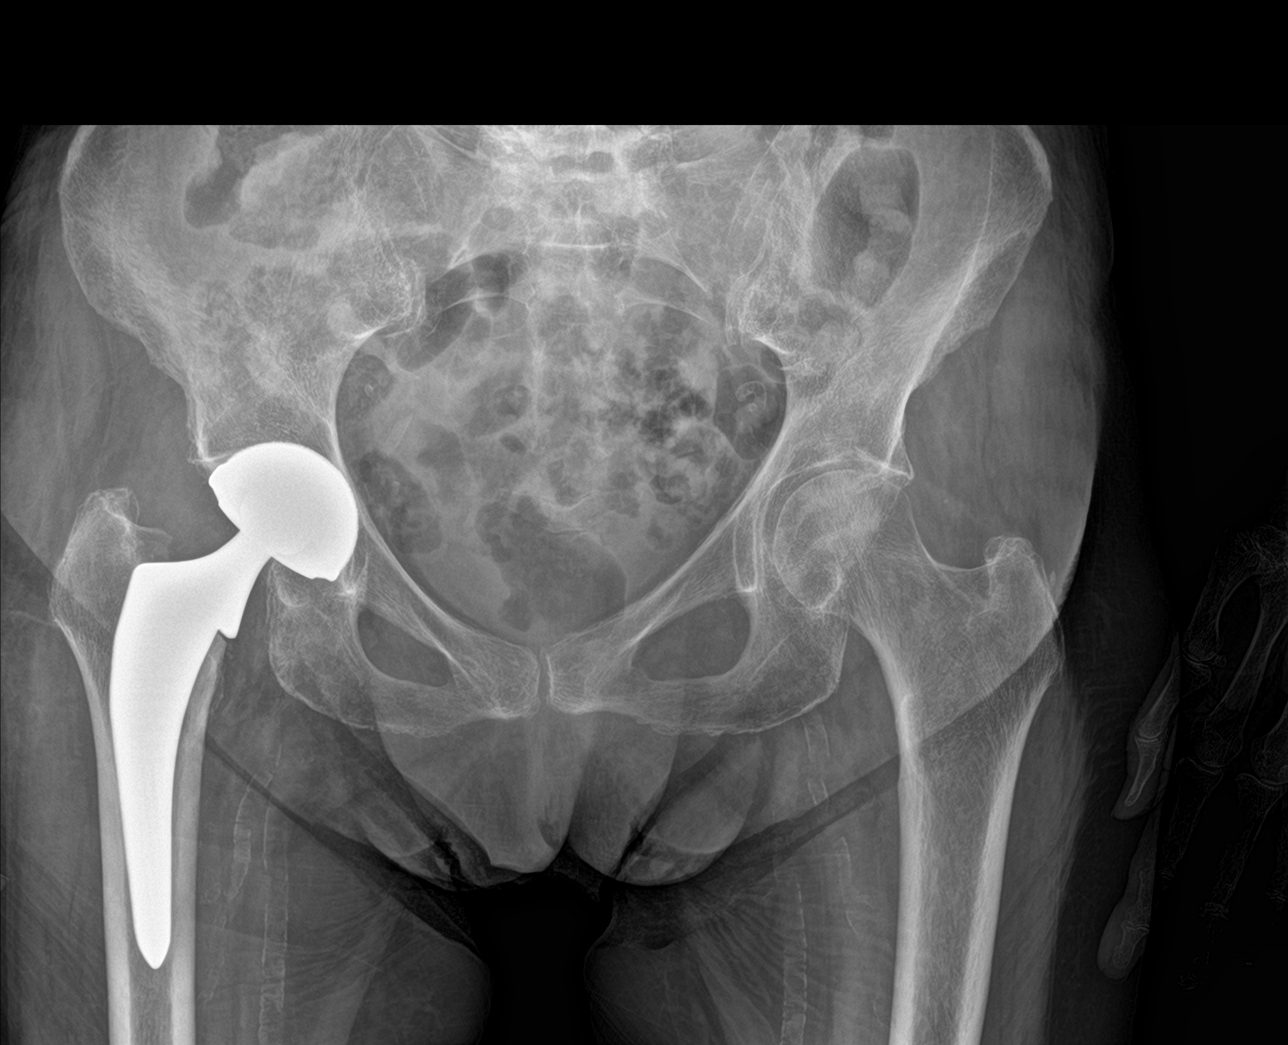

[hip ap]
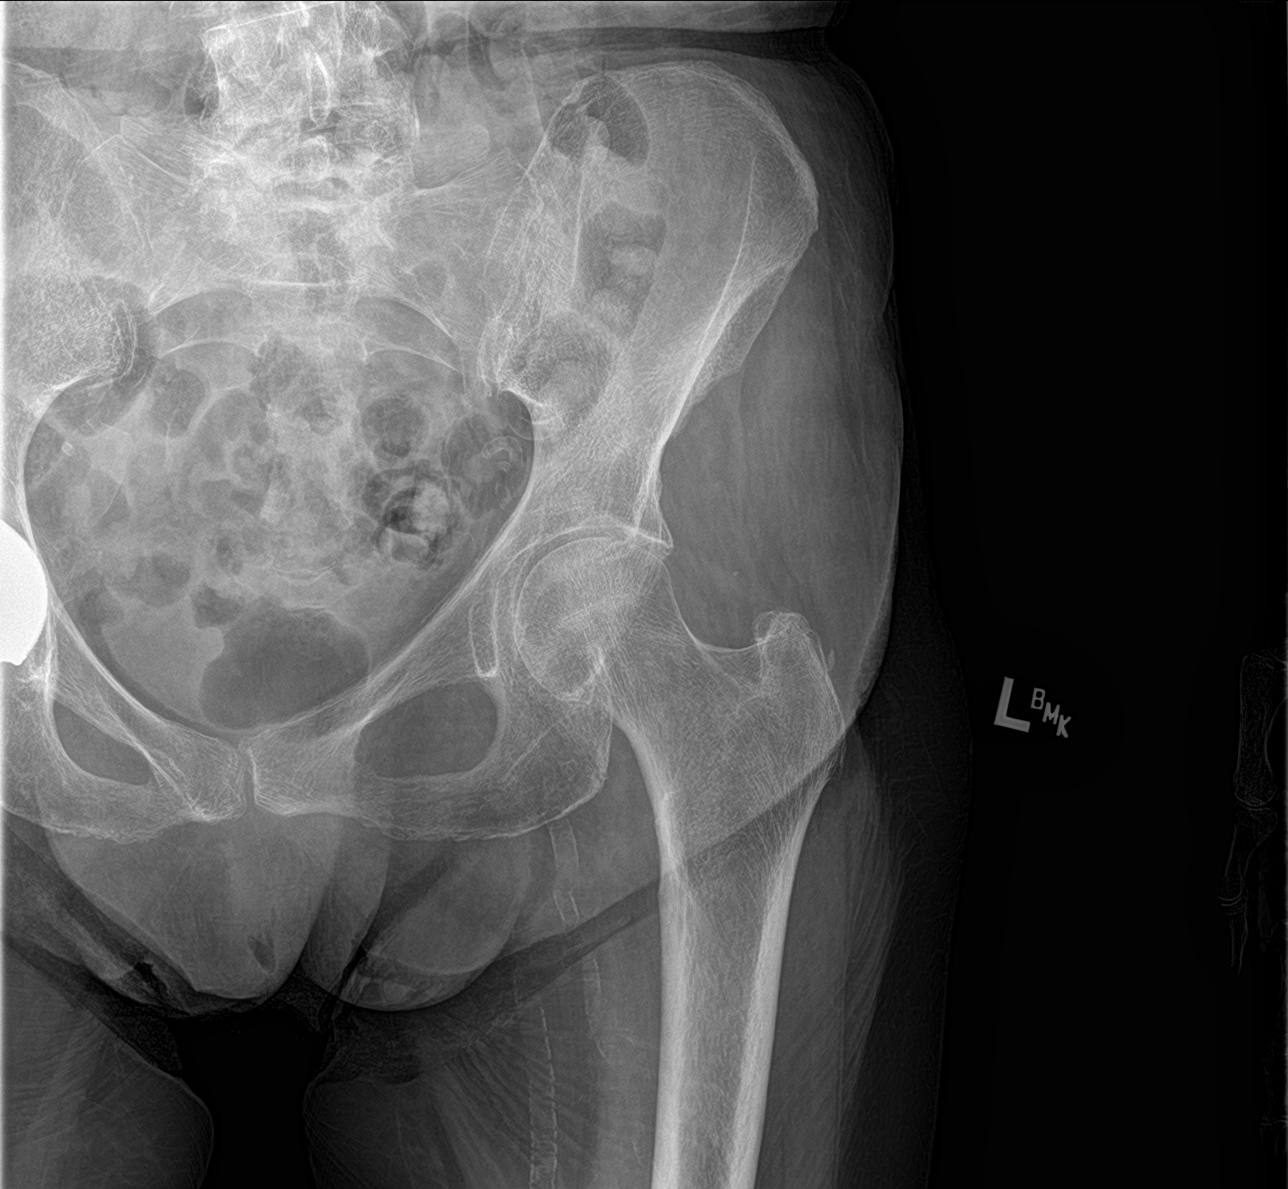

[hip lat]
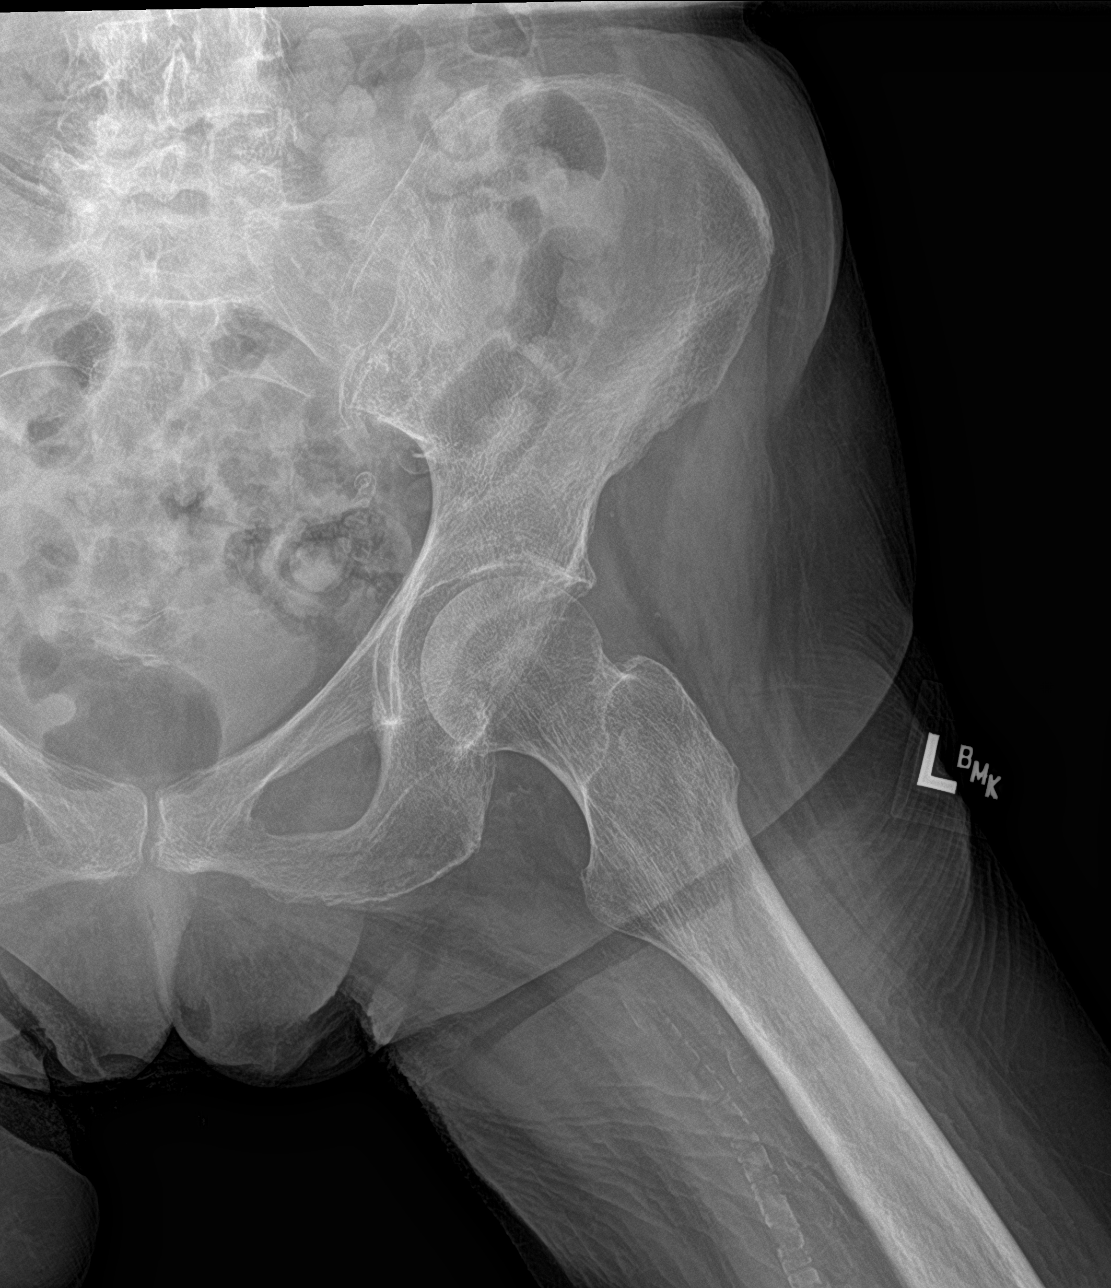

[3 of 3 positions shown; findings below may reference images not displayed]

FINDINGS: Right hip arthroplasty hardware appears intact and appropriately
positioned. There is no osseous fracture or dislocation seen. No
fracture about the left hip. Degenerative changes noted in the lower
lumbar spine, at least moderate in degree, incompletely imaged.

Soft tissues about the pelvis and left hip are unremarkable. Left
femoral atherosclerosis.
IMPRESSION: No acute findings. No osseous fracture or dislocation. Right hip
arthroplasty hardware appears intact and appropriately positioned.

## 2018-03-05 ENCOUNTER — Ambulatory Visit (INDEPENDENT_AMBULATORY_CARE_PROVIDER_SITE_OTHER): Payer: Medicare Other | Admitting: Podiatry

## 2018-03-05 ENCOUNTER — Encounter: Payer: Self-pay | Admitting: Podiatry

## 2018-03-05 DIAGNOSIS — M2042 Other hammer toe(s) (acquired), left foot: Secondary | ICD-10-CM

## 2018-03-05 DIAGNOSIS — M79609 Pain in unspecified limb: Principal | ICD-10-CM

## 2018-03-05 DIAGNOSIS — M79676 Pain in unspecified toe(s): Secondary | ICD-10-CM

## 2018-03-05 DIAGNOSIS — B351 Tinea unguium: Secondary | ICD-10-CM

## 2018-03-05 DIAGNOSIS — M2041 Other hammer toe(s) (acquired), right foot: Secondary | ICD-10-CM

## 2018-03-05 DIAGNOSIS — M2010 Hallux valgus (acquired), unspecified foot: Secondary | ICD-10-CM

## 2018-03-05 NOTE — Progress Notes (Signed)
Complaint:  Visit Type: Patient returns to my office for continued preventative foot care services. Complaint: Patient states" my nails have grown long and thick and become painful to walk and wear shoes"  The patient presents for preventative foot care services. No changes to ROS  Podiatric Exam: Vascular: dorsalis pedis and posterior tibial pulses are palpable bilateral. Capillary return is immediate. Temperature gradient is WNL. Skin turgor WNL  Sensorium: Normal Semmes Weinstein monofilament test. Normal tactile sensation bilaterally. Nail Exam: Pt has thick disfigured discolored nails with subungual debris noted bilateral entire nail hallux through fifth toenails Ulcer Exam: There is no evidence of ulcer or pre-ulcerative changes or infection. Orthopedic Exam: Muscle tone and strength are WNL. No limitations in general ROM. No crepitus or effusions noted. Pes planus.  HAV with overlapping 2  B/L.  Hammer toes  B/L Skin: No Porokeratosis. No infection or ulcers  Diagnosis:  Onychomycosis, , Pain in right toe, pain in left toes  Treatment & Plan Procedures and Treatment: Consent by patient was obtained for treatment procedures. The patient understood the discussion of treatment and procedures well. All questions were answered thoroughly reviewed. Debridement of mycotic and hypertrophic toenails, 1 through 5 bilateral and clearing of subungual debris. No ulceration, no infection noted.  ABN signed for 2019. Return Visit-Office Procedure: Patient instructed to return to the office for a follow up visit 3 months for continued evaluation and treatment.    Yvonne GuntherGregory Adian Edwards DPM

## 2018-06-07 ENCOUNTER — Encounter: Payer: Self-pay | Admitting: Podiatry

## 2018-06-07 ENCOUNTER — Ambulatory Visit (INDEPENDENT_AMBULATORY_CARE_PROVIDER_SITE_OTHER): Payer: Medicare Other | Admitting: Podiatry

## 2018-06-07 DIAGNOSIS — B351 Tinea unguium: Secondary | ICD-10-CM | POA: Diagnosis not present

## 2018-06-07 DIAGNOSIS — M2042 Other hammer toe(s) (acquired), left foot: Secondary | ICD-10-CM

## 2018-06-07 DIAGNOSIS — M79609 Pain in unspecified limb: Principal | ICD-10-CM

## 2018-06-07 DIAGNOSIS — M2041 Other hammer toe(s) (acquired), right foot: Secondary | ICD-10-CM

## 2018-06-07 DIAGNOSIS — M79676 Pain in unspecified toe(s): Secondary | ICD-10-CM | POA: Diagnosis not present

## 2018-06-07 DIAGNOSIS — M2010 Hallux valgus (acquired), unspecified foot: Secondary | ICD-10-CM

## 2018-06-07 NOTE — Progress Notes (Signed)
Complaint:  Visit Type: Patient returns to my office for continued preventative foot care services. Complaint: Patient states" my nails have grown long and thick and become painful to walk and wear shoes"  The patient presents for preventative foot care services. No changes to ROS  Podiatric Exam: Vascular: dorsalis pedis and posterior tibial pulses are palpable bilateral. Capillary return is immediate. Temperature gradient is WNL. Skin turgor WNL  Sensorium: Normal Semmes Weinstein monofilament test. Normal tactile sensation bilaterally. Nail Exam: Pt has thick disfigured discolored nails with subungual debris noted bilateral entire nail hallux through fifth toenails Ulcer Exam: There is no evidence of ulcer or pre-ulcerative changes or infection. Orthopedic Exam: Muscle tone and strength are WNL. No limitations in general ROM. No crepitus or effusions noted. Pes planus.  HAV with overlapping 2  B/L.  Hammer toes  B/L Skin: No Porokeratosis. No infection or ulcers  Diagnosis:  Onychomycosis, , Pain in right toe, pain in left toes  Treatment & Plan Procedures and Treatment: Consent by patient was obtained for treatment procedures. The patient understood the discussion of treatment and procedures well. All questions were answered thoroughly reviewed. Debridement of mycotic and hypertrophic toenails, 1 through 5 bilateral and clearing of subungual debris. No ulceration, no infection noted.  Padding dispensed for separation 1,2 right foot. Return Visit-Office Procedure: Patient instructed to return to the office for a follow up visit 3 months for continued evaluation and treatment.    Helane Gunther DPM

## 2018-09-06 ENCOUNTER — Ambulatory Visit: Payer: Medicare Other | Admitting: Podiatry

## 2019-03-05 ENCOUNTER — Emergency Department: Payer: Medicare Other

## 2019-03-05 ENCOUNTER — Emergency Department
Admission: EM | Admit: 2019-03-05 | Discharge: 2019-03-05 | Disposition: A | Discharge status: Home / Self Care | Payer: Medicare Other | Source: Home / Self Care | Attending: Emergency Medicine | Admitting: Emergency Medicine

## 2019-03-05 ENCOUNTER — Other Ambulatory Visit: Payer: Self-pay

## 2019-03-05 ENCOUNTER — Encounter: Payer: Self-pay | Admitting: Emergency Medicine

## 2019-03-05 DIAGNOSIS — R197 Diarrhea, unspecified: Secondary | ICD-10-CM

## 2019-03-05 DIAGNOSIS — U071 COVID-19: Secondary | ICD-10-CM | POA: Diagnosis not present

## 2019-03-05 DIAGNOSIS — E860 Dehydration: Secondary | ICD-10-CM

## 2019-03-05 DIAGNOSIS — Z79899 Other long term (current) drug therapy: Secondary | ICD-10-CM | POA: Insufficient documentation

## 2019-03-05 DIAGNOSIS — Z96641 Presence of right artificial hip joint: Secondary | ICD-10-CM | POA: Insufficient documentation

## 2019-03-05 DIAGNOSIS — R109 Unspecified abdominal pain: Secondary | ICD-10-CM | POA: Insufficient documentation

## 2019-03-05 LAB — COMPREHENSIVE METABOLIC PANEL
ALT: 23 U/L (ref 0–44)
AST: 38 U/L (ref 15–41)
Albumin: 3.7 g/dL (ref 3.5–5.0)
Alkaline Phosphatase: 60 U/L (ref 38–126)
Anion gap: 14 (ref 5–15)
BUN: 24 mg/dL — ABNORMAL HIGH (ref 8–23)
CO2: 24 mmol/L (ref 22–32)
Calcium: 9.2 mg/dL (ref 8.9–10.3)
Chloride: 98 mmol/L (ref 98–111)
Creatinine, Ser: 1.02 mg/dL — ABNORMAL HIGH (ref 0.44–1.00)
GFR calc Af Amer: 53 mL/min — ABNORMAL LOW (ref >60)
GFR calc non Af Amer: 46 mL/min — ABNORMAL LOW (ref >60)
Glucose, Bld: 96 mg/dL (ref 70–99)
Potassium: 4.1 mmol/L (ref 3.5–5.1)
Sodium: 136 mmol/L (ref 135–145)
Total Bilirubin: 1 mg/dL (ref 0.3–1.2)
Total Protein: 7.4 g/dL (ref 6.5–8.1)

## 2019-03-05 LAB — LIPASE, BLOOD: Lipase: 65 U/L — ABNORMAL HIGH (ref 11–51)

## 2019-03-05 LAB — URINALYSIS, COMPLETE (UACMP) WITH MICROSCOPIC
Bilirubin Urine: NEGATIVE
Glucose, UA: NEGATIVE mg/dL
Hgb urine dipstick: NEGATIVE
Ketones, ur: 20 mg/dL — AB
Leukocytes,Ua: NEGATIVE
Nitrite: NEGATIVE
Protein, ur: NEGATIVE mg/dL
Specific Gravity, Urine: 1.039 — ABNORMAL HIGH (ref 1.005–1.030)
Squamous Epithelial / HPF: NONE SEEN (ref 0–5)
pH: 5 (ref 5.0–8.0)

## 2019-03-05 LAB — CBC WITH DIFFERENTIAL/PLATELET
Abs Immature Granulocytes: 0.03 10*3/uL (ref 0.00–0.07)
Basophils Absolute: 0 10*3/uL (ref 0.0–0.1)
Basophils Relative: 0 %
Eosinophils Absolute: 0 10*3/uL (ref 0.0–0.5)
Eosinophils Relative: 0 %
HCT: 46.7 % — ABNORMAL HIGH (ref 36.0–46.0)
Hemoglobin: 15 g/dL (ref 12.0–15.0)
Immature Granulocytes: 0 %
Lymphocytes Relative: 10 %
Lymphs Abs: 0.7 10*3/uL (ref 0.7–4.0)
MCH: 29 pg (ref 26.0–34.0)
MCHC: 32.1 g/dL (ref 30.0–36.0)
MCV: 90.2 fL (ref 80.0–100.0)
Monocytes Absolute: 0.6 10*3/uL (ref 0.1–1.0)
Monocytes Relative: 9 %
Neutro Abs: 5.4 10*3/uL (ref 1.7–7.7)
Neutrophils Relative %: 81 %
Platelets: 167 10*3/uL (ref 150–400)
RBC: 5.18 MIL/uL — ABNORMAL HIGH (ref 3.87–5.11)
RDW: 12.8 % (ref 11.5–15.5)
WBC: 6.7 10*3/uL (ref 4.0–10.5)
nRBC: 0 % (ref 0.0–0.2)

## 2019-03-05 MED ORDER — SODIUM CHLORIDE 0.9 % IV BOLUS
500.0000 mL | Freq: Once | INTRAVENOUS | Status: AC
  Administered 2019-03-05: 500 mL via INTRAVENOUS

## 2019-03-05 MED ORDER — IOHEXOL 300 MG/ML  SOLN
75.0000 mL | Freq: Once | INTRAMUSCULAR | Status: AC | PRN
  Administered 2019-03-05: 17:00:00 75 mL via INTRAVENOUS

## 2019-03-05 NOTE — ED Notes (Signed)
Patient with incontinence of stool and urine. Patient cleaned and new brief placed. External catheter placed.

## 2019-03-05 NOTE — ED Notes (Signed)
Pt transported home by daughter. Pt's daughter signed physical form of discharge instructions.

## 2019-03-05 NOTE — ED Triage Notes (Signed)
Patient from [facility] via [place]. Per EMS, patient has had decreased appetite and diarrhea x2 days. Patient was found today with an empty bottle of liquid immodium in her room. Staff nor patient are aware of where bottle came from. Patient reports LLQ abdominal pain. Denies taking any medicine.

## 2019-03-05 NOTE — Discharge Instructions (Addendum)
Please seek medical attention for any high fevers, chest pain, shortness of breath, change in behavior, persistent vomiting, bloody stool or any other new or concerning symptoms.  

## 2019-03-05 NOTE — ED Provider Notes (Signed)
[facility] Emergency Department Provider Note  ____________________________________________   I have reviewed the triage vital signs and the nursing notes.   HISTORY  Chief Complaint Diarrhea  History limited by and level 5 caveat due to: confusion   HPI Yvonne Edwards is a 85 y.o. female who presents to the emergency department today because of concern for diarrhea, decreased oral intake and some abdominal pain. Patient herself appears to have some confusion. Family states that she has been acting a little more confused and tired over the past weekend. At the time of my exam the patient denies any abdominal pain. She is not sure exactly why she is her. Family says that the patient occasionally will have diarrhea and that it usually resolved with immodium.   Records reviewed. Per medical record review patient has a history of hyperglycemia.   Past Medical History:  Diagnosis Date  . Hearing loss   . Hyperlipidemia    unspecified  . Macular degeneration     Patient Active Problem List   Diagnosis Date Noted  . Overactive bladder 01/03/2017  . Chest pain 12/09/2016  . Age-related osteoporosis with current pathological fracture with routine healing 12/08/2016  . Hypoxia 12/08/2016  . S/P total hip arthroplasty 12/07/2016  . Hyponatremia 12/07/2016  . Hyperglycemia, unspecified 12/07/2016  . Thrombocytopenia (HCC) 12/07/2016  . Leukocytosis 12/07/2016  . MRSA carrier 12/07/2016  . Vitamin D deficiency 12/07/2016  . Hip fracture (HCC) 12/04/2016  . AMD (age related macular degeneration) 12/02/2015  . Bilateral hearing loss 12/02/2015  . Pure hypercholesterolemia 12/02/2015    Past Surgical History:  Procedure Laterality Date  . CHOLECYSTECTOMY  12/17/2012  . DILATION AND CURETTAGE, DIAGNOSTIC / THERAPEUTIC  1976  . JOINT REPLACEMENT     HIp surgery in August  . TOTAL HIP ARTHROPLASTY Right 12/05/2016   Procedure: TOTAL HIP ARTHROPLASTY ANTERIOR  APPROACH;  Surgeon: Kennedy BuckerMenz, Michael, MD;  Location: [facility] ORS;  Service: Orthopedics;  Laterality: Right;    Prior to Admission medications   Medication Sig Start Date End Date Taking? Authorizing Provider  acetaminophen (TYLENOL) 325 MG tablet Take 2 tablets (650 mg total) by mouth every 6 (six) hours as needed for mild pain (or Fever >/= 101). 12/07/16   Katharina CaperVaickute, Rima, MD  calcium-vitamin D (OSCAL 500/200 D-3) 500-200 MG-UNIT tablet Take 1 tablet by mouth 2 (two) times daily. 12/07/16 12/07/17  Katharina CaperVaickute, Rima, MD  Cholecalciferol 4000 units CAPS Take 1 capsule by mouth daily.    [provider]  docusate sodium (COLACE) 100 MG capsule Take 1 capsule (100 mg total) by mouth 2 (two) times daily. 12/07/16   Katharina CaperVaickute, Rima, MD  HYDROcodone-acetaminophen (NORCO/VICODIN) 5-325 MG tablet Take 1-2 tablets by mouth every 4 (four) hours as needed for moderate pain or severe pain (breakthrough pain). 12/14/16   Lorenso QuarryLeach, Shannon, NP  loperamide (IMODIUM A-D) 2 MG tablet Take by mouth. 07/03/17   [provider]  methocarbamol (ROBAXIN) 500 MG tablet Take 1 tablet (500 mg total) by mouth every 6 (six) hours as needed for muscle spasms. 12/07/16   Katharina CaperVaickute, Rima, MD  mirtazapine (REMERON) 7.5 MG tablet Take 7.5 mg by mouth at bedtime.    [provider]  Multiple Vitamin (MULTI-VITAMINS) TABS Take by mouth.    [provider]  Multiple Vitamins-Minerals (PRESERVISION AREDS 2 PO) Take 1 capsule by mouth daily.     [provider]  naproxen sodium (ANAPROX) 220 MG tablet Take 220 mg by mouth daily as needed.  [provider]  NUTRITIONAL SUPPLEMENT LIQD Give Magic Cup by mouth 2 times daily with lunch and supper trays for nutritional support.    [provider]  Nutritional Supplements (ENSURE ENLIVE PO) Take 1 Bottle by mouth 2 (two) times daily.    [provider]  rosuvastatin (CRESTOR) 10 MG tablet Take 10 mg by mouth daily.  04/06/15   [provider]  UNABLE TO FIND Take by mouth.    [provider]    Allergies Biaxin [clarithromycin], Codeine, and Shellfish allergy  Family History  Problem Relation Age of Onset  . Aneurysm Mother   . Breast cancer Sister   . Ovarian cancer Sister   . CAD Sister   . Stroke Sister   . Lung cancer Brother   . Heart disease Father   . Stroke Father     Social History Social History   Tobacco Use  . Smoking status: Never Smoker  . Smokeless tobacco: Never Used  Substance Use Topics  . Alcohol use: No    Alcohol/week: 0.0 standard drinks  . Drug use: No    Review of Systems Constitutional: No fever/chills Eyes: No visual changes. ENT: No sore throat. Cardiovascular: Denies chest pain. Respiratory: Denies shortness of breath. Gastrointestinal: No abdominal pain.  Genitourinary: Negative for dysuria. Musculoskeletal: Negative for back pain. Skin: Negative for rash. Neurological: Negative for headaches, focal weakness or numbness.  ____________________________________________   PHYSICAL EXAM:  VITAL SIGNS: ED Triage Vitals  Enc Vitals Group     BP 03/05/19 1553 (!) 144/65     Pulse Rate 03/05/19 1553 87     Resp 03/05/19 1553 18     Temp 03/05/19 1553 99 F (37.2 C)     Temp Source 03/05/19 1553 Oral     SpO2 03/05/19 1553 92 %     Weight 03/05/19 1601 103 lb 9.9 oz (47 kg)     Height 03/05/19 1601 4\' 10"  (1.473 m)     Head Circumference --      Peak Flow --      Pain Score 03/05/19 1601 6   Constitutional: Alert and oriented.  Eyes: Conjunctivae are normal.  ENT      Head: Normocephalic and atraumatic.      Nose: No congestion/rhinnorhea.      Mouth/Throat: Mucous membranes are moist.      Neck: No stridor. Hematological/Lymphatic/Immunilogical: No cervical lymphadenopathy. Cardiovascular: Normal rate, regular rhythm.  No murmurs, rubs, or gallops. Respiratory: Normal respiratory effort without tachypnea nor retractions. Breath sounds are  clear and equal bilaterally. No wheezes/rales/rhonchi. Gastrointestinal: Soft and minimally tender in the upper abdomen. No rebound. No guarding. No distention.  Genitourinary: Deferred Musculoskeletal: Normal range of motion in all extremities. No lower extremity edema. Neurologic:  Normal speech and language. No gross focal neurologic deficits are appreciated.  Skin:  Skin is warm, dry and intact. No rash noted. Psychiatric: Mood and affect are normal. Speech and behavior are normal. Patient exhibits appropriate insight and judgment.  ____________________________________________    LABS (pertinent positives/negatives)  Lipase 65 CMP wnl except bun 24, cr 1.02 CBC wbc 6.7, hgb 15.0, plt 167 UA clear, ketones 20, rbc and wbc 0-5, leukocytes and nitrite negative ____________________________________________   EKG  I, 03/07/19, attending physician, personally viewed and interpreted this EKG  EKG Time: 1601 Rate: 86 Rhythm: sinus rhythm Axis: normal Intervals: qtc 421 QRS: narrow ST changes: no st elevation Impression: normal ekg   ____________________________________________    RADIOLOGY  CT abd/pel No acute abnormality  ____________________________________________   PROCEDURES  Procedures  ____________________________________________   INITIAL IMPRESSION / ASSESSMENT AND PLAN / ED COURSE  Pertinent labs & imaging results that were available during my care of the patient were reviewed by me and considered in my medical decision making (see chart for details).   Patient presented to the emergency department tonight because of concern for diarrhea and some abdominal pain. On exam patient had some mild epigastric discomfort. Lipase is very minimally elevated. Patient at this time states she has no further discomfort. While it does appear patient has had pancreatitis in the past at this time doubt a significant pancreatitis. CT scan was obtained without any  obvious pancreatic inflammation. No other acute findings. UA was also obtained which was negative. Given that work up is reassuring do think it is reasonable for patient to be discharged home. Discussed return precautions with family.   ____________________________________________   FINAL CLINICAL IMPRESSION(S) / ED DIAGNOSES  Final diagnoses:  Diarrhea, unspecified type  Dehydration     Note: This dictation was prepared with Dragon dictation. Any transcriptional errors that result from this process are unintentional     Nance Pear, MD 03/05/19 2020

## 2019-03-07 ENCOUNTER — Emergency Department: Payer: Medicare Other

## 2019-03-07 ENCOUNTER — Other Ambulatory Visit: Payer: Self-pay

## 2019-03-07 ENCOUNTER — Inpatient Hospital Stay
Admission: EM | Admit: 2019-03-07 | Discharge: 2019-03-08 | DRG: 177 | Disposition: A | Discharge status: Other Acute Inpatient Hospital | Payer: Medicare Other | Source: Skilled Nursing Facility | Attending: Internal Medicine | Admitting: Internal Medicine

## 2019-03-07 DIAGNOSIS — E559 Vitamin D deficiency, unspecified: Secondary | ICD-10-CM | POA: Diagnosis present

## 2019-03-07 DIAGNOSIS — G9341 Metabolic encephalopathy: Secondary | ICD-10-CM | POA: Diagnosis not present

## 2019-03-07 DIAGNOSIS — J9383 Other pneumothorax: Secondary | ICD-10-CM | POA: Diagnosis present

## 2019-03-07 DIAGNOSIS — Z881 Allergy status to other antibiotic agents status: Secondary | ICD-10-CM

## 2019-03-07 DIAGNOSIS — K529 Noninfective gastroenteritis and colitis, unspecified: Secondary | ICD-10-CM | POA: Diagnosis not present

## 2019-03-07 DIAGNOSIS — H9193 Unspecified hearing loss, bilateral: Secondary | ICD-10-CM | POA: Diagnosis present

## 2019-03-07 DIAGNOSIS — Z96641 Presence of right artificial hip joint: Secondary | ICD-10-CM | POA: Diagnosis not present

## 2019-03-07 DIAGNOSIS — Z6824 Body mass index (BMI) 24.0-24.9, adult: Secondary | ICD-10-CM | POA: Diagnosis not present

## 2019-03-07 DIAGNOSIS — E860 Dehydration: Secondary | ICD-10-CM | POA: Diagnosis present

## 2019-03-07 DIAGNOSIS — N3000 Acute cystitis without hematuria: Secondary | ICD-10-CM | POA: Diagnosis present

## 2019-03-07 DIAGNOSIS — E440 Moderate protein-calorie malnutrition: Secondary | ICD-10-CM | POA: Diagnosis present

## 2019-03-07 DIAGNOSIS — N390 Urinary tract infection, site not specified: Secondary | ICD-10-CM | POA: Diagnosis present

## 2019-03-07 DIAGNOSIS — E46 Unspecified protein-calorie malnutrition: Secondary | ICD-10-CM

## 2019-03-07 DIAGNOSIS — Z79899 Other long term (current) drug therapy: Secondary | ICD-10-CM | POA: Diagnosis not present

## 2019-03-07 DIAGNOSIS — Z6821 Body mass index (BMI) 21.0-21.9, adult: Secondary | ICD-10-CM

## 2019-03-07 DIAGNOSIS — R531 Weakness: Secondary | ICD-10-CM

## 2019-03-07 DIAGNOSIS — Z66 Do not resuscitate: Secondary | ICD-10-CM | POA: Diagnosis not present

## 2019-03-07 DIAGNOSIS — Z801 Family history of malignant neoplasm of trachea, bronchus and lung: Secondary | ICD-10-CM

## 2019-03-07 DIAGNOSIS — Z9049 Acquired absence of other specified parts of digestive tract: Secondary | ICD-10-CM | POA: Diagnosis not present

## 2019-03-07 DIAGNOSIS — H3530 Unspecified macular degeneration: Secondary | ICD-10-CM | POA: Diagnosis not present

## 2019-03-07 DIAGNOSIS — E785 Hyperlipidemia, unspecified: Secondary | ICD-10-CM | POA: Diagnosis not present

## 2019-03-07 DIAGNOSIS — R4182 Altered mental status, unspecified: Secondary | ICD-10-CM | POA: Diagnosis present

## 2019-03-07 DIAGNOSIS — R739 Hyperglycemia, unspecified: Secondary | ICD-10-CM | POA: Diagnosis present

## 2019-03-07 DIAGNOSIS — Z823 Family history of stroke: Secondary | ICD-10-CM

## 2019-03-07 DIAGNOSIS — E7800 Pure hypercholesterolemia, unspecified: Secondary | ICD-10-CM | POA: Diagnosis present

## 2019-03-07 DIAGNOSIS — B961 Klebsiella pneumoniae [K. pneumoniae] as the cause of diseases classified elsewhere: Secondary | ICD-10-CM | POA: Diagnosis not present

## 2019-03-07 DIAGNOSIS — Z8249 Family history of ischemic heart disease and other diseases of the circulatory system: Secondary | ICD-10-CM

## 2019-03-07 DIAGNOSIS — Z885 Allergy status to narcotic agent status: Secondary | ICD-10-CM

## 2019-03-07 DIAGNOSIS — U071 COVID-19: Principal | ICD-10-CM | POA: Diagnosis present

## 2019-03-07 DIAGNOSIS — R627 Adult failure to thrive: Secondary | ICD-10-CM | POA: Diagnosis not present

## 2019-03-07 DIAGNOSIS — F0390 Unspecified dementia without behavioral disturbance: Secondary | ICD-10-CM | POA: Diagnosis present

## 2019-03-07 DIAGNOSIS — J1289 Other viral pneumonia: Secondary | ICD-10-CM | POA: Diagnosis not present

## 2019-03-07 DIAGNOSIS — R748 Abnormal levels of other serum enzymes: Secondary | ICD-10-CM | POA: Diagnosis present

## 2019-03-07 DIAGNOSIS — Z515 Encounter for palliative care: Secondary | ICD-10-CM | POA: Diagnosis not present

## 2019-03-07 DIAGNOSIS — E876 Hypokalemia: Secondary | ICD-10-CM | POA: Diagnosis present

## 2019-03-07 DIAGNOSIS — Z23 Encounter for immunization: Secondary | ICD-10-CM | POA: Diagnosis not present

## 2019-03-07 DIAGNOSIS — J939 Pneumothorax, unspecified: Secondary | ICD-10-CM

## 2019-03-07 DIAGNOSIS — R410 Disorientation, unspecified: Secondary | ICD-10-CM | POA: Diagnosis not present

## 2019-03-07 LAB — CBC WITH DIFFERENTIAL/PLATELET
Abs Immature Granulocytes: 0.03 10*3/uL (ref 0.00–0.07)
Basophils Absolute: 0 10*3/uL (ref 0.0–0.1)
Basophils Relative: 0 %
Eosinophils Absolute: 0 10*3/uL (ref 0.0–0.5)
Eosinophils Relative: 0 %
HCT: 44.4 % (ref 36.0–46.0)
Hemoglobin: 14.9 g/dL (ref 12.0–15.0)
Immature Granulocytes: 0 %
Lymphocytes Relative: 7 %
Lymphs Abs: 0.6 10*3/uL — ABNORMAL LOW (ref 0.7–4.0)
MCH: 28.9 pg (ref 26.0–34.0)
MCHC: 33.6 g/dL (ref 30.0–36.0)
MCV: 86 fL (ref 80.0–100.0)
Monocytes Absolute: 0.7 10*3/uL (ref 0.1–1.0)
Monocytes Relative: 10 %
Neutro Abs: 6.3 10*3/uL (ref 1.7–7.7)
Neutrophils Relative %: 83 %
Platelets: 202 10*3/uL (ref 150–400)
RBC: 5.16 MIL/uL — ABNORMAL HIGH (ref 3.87–5.11)
RDW: 12.7 % (ref 11.5–15.5)
WBC: 7.6 10*3/uL (ref 4.0–10.5)
nRBC: 0 % (ref 0.0–0.2)

## 2019-03-07 LAB — URINALYSIS, ROUTINE W REFLEX MICROSCOPIC
Bilirubin Urine: NEGATIVE
Glucose, UA: NEGATIVE mg/dL
Hgb urine dipstick: NEGATIVE
Ketones, ur: 20 mg/dL — AB
Nitrite: NEGATIVE
Protein, ur: 30 mg/dL — AB
Specific Gravity, Urine: 1.019 (ref 1.005–1.030)
pH: 6 (ref 5.0–8.0)

## 2019-03-07 LAB — COMPREHENSIVE METABOLIC PANEL
ALT: 21 U/L (ref 0–44)
AST: 32 U/L (ref 15–41)
Albumin: 3.2 g/dL — ABNORMAL LOW (ref 3.5–5.0)
Alkaline Phosphatase: 61 U/L (ref 38–126)
Anion gap: 13 (ref 5–15)
BUN: 18 mg/dL (ref 8–23)
CO2: 27 mmol/L (ref 22–32)
Calcium: 9 mg/dL (ref 8.9–10.3)
Chloride: 95 mmol/L — ABNORMAL LOW (ref 98–111)
Creatinine, Ser: 0.79 mg/dL (ref 0.44–1.00)
GFR calc Af Amer: >60 mL/min (ref >60)
GFR calc non Af Amer: >60 mL/min (ref >60)
Glucose, Bld: 109 mg/dL — ABNORMAL HIGH (ref 70–99)
Potassium: 3.3 mmol/L — ABNORMAL LOW (ref 3.5–5.1)
Sodium: 135 mmol/L (ref 135–145)
Total Bilirubin: 0.8 mg/dL (ref 0.3–1.2)
Total Protein: 7.2 g/dL (ref 6.5–8.1)

## 2019-03-07 LAB — TROPONIN I (HIGH SENSITIVITY)
Troponin I (High Sensitivity): 17 ng/L (ref <18)
Troponin I (High Sensitivity): 16 ng/L (ref <18)

## 2019-03-07 LAB — MAGNESIUM: Magnesium: 1.8 mg/dL (ref 1.7–2.4)

## 2019-03-07 LAB — LIPASE, BLOOD: Lipase: 84 U/L — ABNORMAL HIGH (ref 11–51)

## 2019-03-07 LAB — LACTIC ACID, PLASMA: Lactic Acid, Venous: 1.5 mmol/L (ref 0.5–1.9)

## 2019-03-07 MED ORDER — MIRTAZAPINE 15 MG PO TABS
7.5000 mg | ORAL_TABLET | Freq: Every day | ORAL | Status: DC
  Filled 2019-03-07: qty 1

## 2019-03-07 MED ORDER — ENOXAPARIN SODIUM 30 MG/0.3ML ~~LOC~~ SOLN
30.0000 mg | SUBCUTANEOUS | Status: DC
  Administered 2019-03-08: 30 mg via SUBCUTANEOUS
  Filled 2019-03-07: qty 0.3

## 2019-03-07 MED ORDER — SODIUM CHLORIDE 0.9 % IV SOLN
1.0000 g | INTRAVENOUS | Status: DC
  Filled 2019-03-07: qty 10

## 2019-03-07 MED ORDER — ROSUVASTATIN CALCIUM 10 MG PO TABS
10.0000 mg | ORAL_TABLET | Freq: Every day | ORAL | Status: DC
  Filled 2019-03-07 (×2): qty 1

## 2019-03-07 MED ORDER — POTASSIUM CHLORIDE 20 MEQ PO PACK
40.0000 meq | PACK | Freq: Two times a day (BID) | ORAL | Status: AC
  Administered 2019-03-08: 06:00:00 40 meq via ORAL
  Filled 2019-03-07: qty 2

## 2019-03-07 MED ORDER — LOPERAMIDE HCL 2 MG PO TABS: 2.0000 mg | ORAL_TABLET | Freq: Four times a day (QID) | ORAL | Status: DC | PRN

## 2019-03-07 MED ORDER — SODIUM CHLORIDE 0.9 % IV SOLN
1.0000 g | Freq: Once | INTRAVENOUS | Status: AC
  Administered 2019-03-07: 1 g via INTRAVENOUS
  Filled 2019-03-07: qty 10

## 2019-03-07 MED ORDER — SODIUM CHLORIDE 0.9 % IV BOLUS
500.0000 mL | Freq: Once | INTRAVENOUS | Status: AC
  Administered 2019-03-07: 16:00:00 500 mL via INTRAVENOUS

## 2019-03-07 NOTE — ED Triage Notes (Signed)
Pt arrives via EMS from [facility].  Per EMS, pt had decreased appetite and oral intake. Prior to this she had been independent but now requires help with activities of daily living. Facility also reports that pt had been febrile.   EMS VS- 99.6, 148/75, 92% RA, HR 94 CBG 134

## 2019-03-07 NOTE — ED Provider Notes (Signed)
[facility] Emergency Department Provider Note  ____________________________________________   First MD Initiated Contact with Patient 03/07/19 1512     (approximate)  I have reviewed the triage vital signs and the nursing notes.   HISTORY  Chief Complaint Weakness    HPI Yvonne Edwards is a 85 y.o. female with hyperlipidemia who presents from [facility] assisted [facility].  Per EMS patient has had decreased appetite and oral intake.  Patient previously was independent but now requiring help with daily activities.  There is also concern that she has been febrile although EMS she is 99.6.  Unable to get full HPI due to confusion.  Patient was seen 2 days ago for diarrhea, decreased oral intake and abdominal pain.  She had a CT scan that was negative at that time.        Past Medical History:  Diagnosis Date  . Hearing loss   . Hyperlipidemia    unspecified  . Macular degeneration     Patient Active Problem List   Diagnosis Date Noted  . Overactive bladder 01/03/2017  . Chest pain 12/09/2016  . Age-related osteoporosis with current pathological fracture with routine healing 12/08/2016  . Hypoxia 12/08/2016  . S/P total hip arthroplasty 12/07/2016  . Hyponatremia 12/07/2016  . Hyperglycemia, unspecified 12/07/2016  . Thrombocytopenia ([place]) 12/07/2016  . Leukocytosis 12/07/2016  . MRSA carrier 12/07/2016  . Vitamin D deficiency 12/07/2016  . Hip fracture ([place]) 12/04/2016  . AMD (age related macular degeneration) 12/02/2015  . Bilateral hearing loss 12/02/2015  . Pure hypercholesterolemia 12/02/2015    Past Surgical History:  Procedure Laterality Date  . CHOLECYSTECTOMY  12/17/2012  . DILATION AND CURETTAGE, DIAGNOSTIC / THERAPEUTIC  1976  . JOINT REPLACEMENT     HIp surgery in August  . TOTAL HIP ARTHROPLASTY Right 12/05/2016   Procedure: TOTAL HIP ARTHROPLASTY ANTERIOR APPROACH;  Surgeon: Hessie Knows, MD;  Location: [facility] ORS;  Service:  Orthopedics;  Laterality: Right;    Prior to Admission medications   Medication Sig Start Date End Date Taking? Authorizing Provider  acetaminophen (TYLENOL) 325 MG tablet Take 2 tablets (650 mg total) by mouth every 6 (six) hours as needed for mild pain (or Fever >/= 101). 12/07/16   Theodoro Grist, MD  calcium-vitamin D (OSCAL 500/200 D-3) 500-200 MG-UNIT tablet Take 1 tablet by mouth 2 (two) times daily. 12/07/16 03/05/19  Theodoro Grist, MD  Cholecalciferol 100 MCG (4000 UT) TABS Take 4,000 Units by mouth daily.     [provider]  docusate sodium (COLACE) 100 MG capsule Take 1 capsule (100 mg total) by mouth 2 (two) times daily. Patient taking differently: Take 100 mg by mouth 2 (two) times daily as needed.  12/07/16   Theodoro Grist, MD  loperamide (IMODIUM A-D) 2 MG tablet Take 2 mg by mouth every 6 (six) hours as needed.  07/03/17   [provider]  methocarbamol (ROBAXIN) 500 MG tablet Take 1 tablet (500 mg total) by mouth every 6 (six) hours as needed for muscle spasms. 12/07/16   Theodoro Grist, MD  mirtazapine (REMERON) 7.5 MG tablet Take 7.5 mg by mouth at bedtime.    [provider]  Multiple Vitamins-Minerals (PRESERVISION AREDS 2 PO) Take 1 capsule by mouth 2 (two) times daily.     [provider]  naproxen sodium (ANAPROX) 220 MG tablet Take 220 mg by mouth daily as needed.    [provider]  Nutritional Supplements (ENSURE ENLIVE PO) Take 1 Bottle by mouth 2 (  two) times daily.    [provider]  rosuvastatin (CRESTOR) 10 MG tablet Take 10 mg by mouth at bedtime.  04/06/15   [provider]  vitamin C (ASCORBIC ACID) 500 MG tablet Take 500 mg by mouth 2 (two) times daily.    [provider]  Zinc Sulfate 220 (50 Zn) MG TABS Take 220 mg by mouth daily.    [provider]    Allergies Biaxin [clarithromycin], Codeine, and Shellfish allergy  Family History  Problem Relation Age of Onset  . Aneurysm  Mother   . Breast cancer Sister   . Ovarian cancer Sister   . CAD Sister   . Stroke Sister   . Lung cancer Brother   . Heart disease Father   . Stroke Father     Social History Social History   Tobacco Use  . Smoking status: Never Smoker  . Smokeless tobacco: Never Used  Substance Use Topics  . Alcohol use: No    Alcohol/week: 0.0 standard drinks  . Drug use: No      Review of Systems Review of systems is limited due to patient's confusion. ____________________________________________   PHYSICAL EXAM:  VITAL SIGNS: Blood pressure (!) 148/66, pulse 81, temperature 98.1 F (36.7 C), temperature source Oral, resp. rate (!) 25, height 4\' 10"  (1.473 m), weight 47 kg, SpO2 95 %.    Constitutional: Alert and oriented x3.  But difficulty with following commands.  Thin appearing. elderly Eyes: Conjunctivae are normal. EOMI. Head: Atraumatic. Nose: No congestion/rhinnorhea. Mouth/Throat: Mucous membranes are moist.   Neck: No stridor. Trachea Midline. FROM Cardiovascular: Normal rate, regular rhythm. Grossly normal heart sounds.  Good peripheral circulation. Respiratory: Normal respiratory effort.  No retractions. Lungs CTAB. Gastrointestinal: Soft and nontender. No distention. No abdominal bruits.  Musculoskeletal: No lower extremity tenderness nor edema.  No joint effusions. Neurologic: Difficulty with following commands.  Does squeeze bilateral hands.  Does not want to wiggle her legs but does spontaneously move them.  No obvious facial droop. Skin:  Skin is warm, dry and intact. No rash noted. Psychiatric: Confused, difficult to fully assess GU: Deferred   ____________________________________________   LABS (all labs ordered are listed, but only abnormal results are displayed)  Labs Reviewed  CBC WITH DIFFERENTIAL/PLATELET - Abnormal; Notable for the following components:      Result Value   RBC 5.16 (*)    Lymphs Abs 0.6 (*)    All other components within  normal limits  COMPREHENSIVE METABOLIC PANEL - Abnormal; Notable for the following components:   Potassium 3.3 (*)    Chloride 95 (*)    Glucose, Bld 109 (*)    Albumin 3.2 (*)    All other components within normal limits  LIPASE, BLOOD - Abnormal; Notable for the following components:   Lipase 84 (*)    All other components within normal limits  URINALYSIS, ROUTINE W REFLEX MICROSCOPIC - Abnormal; Notable for the following components:   Color, Urine YELLOW (*)    APPearance CLOUDY (*)    Ketones, ur 20 (*)    Protein, ur 30 (*)    Leukocytes,Ua TRACE (*)    Bacteria, UA MANY (*)    All other components within normal limits  URINE CULTURE  SARS CORONAVIRUS 2 (TAT 6-24 HRS)  MAGNESIUM  LACTIC ACID, PLASMA  TROPONIN I (HIGH SENSITIVITY)  TROPONIN I (HIGH SENSITIVITY)   ____________________________________________   ED ECG REPORT I, Concha SeMary E Arelly Whittenberg, the attending physician, personally viewed and interpreted this  ECG.  EKG is normal sinus rate of 94, no ST elevations, no T wave inversions, normal intervals, occasional PVC ____________________________________________  RADIOLOGY Vela Prose, personally viewed and evaluated these images (plain radiographs) as part of my medical decision making, as well as reviewing the written report by the radiologist.  ED MD interpretation: Chest x-ray no pneumonia  Official radiology report(s): Ct Head Wo Contrast  Result Date: 03/07/2019 CLINICAL DATA:  85 year old female with altered mental status. EXAM: CT HEAD WITHOUT CONTRAST CT CERVICAL SPINE WITHOUT CONTRAST TECHNIQUE: Multidetector CT imaging of the head and cervical spine was performed following the standard protocol without intravenous contrast. Multiplanar CT image reconstructions of the cervical spine were also generated. COMPARISON:  None. FINDINGS: CT HEAD FINDINGS Brain: Moderate to advanced age-related atrophy and chronic microvascular ischemic changes. There is no acute  intracranial hemorrhage. No mass effect or midline shift. No extra-axial fluid collection. Vascular: No hyperdense vessel or unexpected calcification. Skull: Normal. Negative for fracture or focal lesion. Sinuses/Orbits: No acute finding. Other: None CT CERVICAL SPINE FINDINGS Alignment: No acute subluxation. Grade 1 C3-C4 anterolisthesis. Skull base and vertebrae: No acute fracture. Osteopenia. Soft tissues and spinal canal: No prevertebral fluid or swelling. No visible canal hematoma. Disc levels: Multilevel degenerative changes with endplate irregularity and disc space narrowing. Upper chest: Biapical subpleural scarring. There is a 5 mm right apical pneumothorax. This is new compared to the CT of 12/09/2016. Other: Bilateral carotid bulb calcified plaques. IMPRESSION: 1. No acute intracranial hemorrhage. Moderate to advanced age-related atrophy and chronic microvascular ischemic changes. 2. No acute/traumatic cervical spine pathology. Multilevel degenerative changes. 3. A 5 mm right apical pneumothorax. These results were called by telephone at the time of interpretation on 03/07/2019 at 4:29 pm to provider [facility] , who verbally acknowledged these results. Electronically Signed   By: Elgie Collard M.D.   On: 03/07/2019 16:30   Ct Cervical Spine Wo Contrast  Result Date: 03/07/2019 CLINICAL DATA:  85 year old female with altered mental status. EXAM: CT HEAD WITHOUT CONTRAST CT CERVICAL SPINE WITHOUT CONTRAST TECHNIQUE: Multidetector CT imaging of the head and cervical spine was performed following the standard protocol without intravenous contrast. Multiplanar CT image reconstructions of the cervical spine were also generated. COMPARISON:  None. FINDINGS: CT HEAD FINDINGS Brain: Moderate to advanced age-related atrophy and chronic microvascular ischemic changes. There is no acute intracranial hemorrhage. No mass effect or midline shift. No extra-axial fluid collection. Vascular: No hyperdense vessel  or unexpected calcification. Skull: Normal. Negative for fracture or focal lesion. Sinuses/Orbits: No acute finding. Other: None CT CERVICAL SPINE FINDINGS Alignment: No acute subluxation. Grade 1 C3-C4 anterolisthesis. Skull base and vertebrae: No acute fracture. Osteopenia. Soft tissues and spinal canal: No prevertebral fluid or swelling. No visible canal hematoma. Disc levels: Multilevel degenerative changes with endplate irregularity and disc space narrowing. Upper chest: Biapical subpleural scarring. There is a 5 mm right apical pneumothorax. This is new compared to the CT of 12/09/2016. Other: Bilateral carotid bulb calcified plaques. IMPRESSION: 1. No acute intracranial hemorrhage. Moderate to advanced age-related atrophy and chronic microvascular ischemic changes. 2. No acute/traumatic cervical spine pathology. Multilevel degenerative changes. 3. A 5 mm right apical pneumothorax. These results were called by telephone at the time of interpretation on 03/07/2019 at 4:29 pm to provider [facility] , who verbally acknowledged these results. Electronically Signed   By: Elgie Collard M.D.   On: 03/07/2019 16:30   Dg Chest Portable 1 View  Result Date: 03/07/2019 CLINICAL DATA:  Fever  and weakness. EXAM: PORTABLE CHEST 1 VIEW COMPARISON:  Chest x-ray dated December 09, 2016. FINDINGS: The heart size and mediastinal contours are within normal limits. Atherosclerotic calcification of the thoracic aorta. Normal pulmonary vascularity. No focal consolidation, pleural effusion, or pneumothorax. No acute osseous abnormality. IMPRESSION: No active disease. Electronically Signed   By: Obie Dredge M.D.   On: 03/07/2019 15:59    ____________________________________________   PROCEDURES  Procedure(s) performed (including Critical Care):  Procedures   ____________________________________________   INITIAL IMPRESSION / ASSESSMENT AND PLAN / ED COURSE  Yvonne Edwards was evaluated in Emergency  Department on 03/07/2019 for the symptoms described in the history of present illness. She was evaluated in the context of the global COVID-19 pandemic, which necessitated consideration that the patient might be at risk for infection with the SARS-CoV-2 virus that causes COVID-19. Institutional protocols and algorithms that pertain to the evaluation of patients at risk for COVID-19 are in a state of rapid change based on information released by regulatory bodies including the CDC and federal and state organizations. These policies and algorithms were followed during the patient's care in the ED.    Patient is a 85 year old who presents with generalized weakness and difficulty doing her ADLs start acutely over the last few days.  Also concern for fevers and change in the smell of her urine per EMS.  Labs evaluate for electrolyte abnormalities, AKI, UTI.  Will get CT head to evaluate for intracranial hemorrhage given there was report of may be a fall recently although unclear.  Will also get chest x-ray to evaluate for pneumonia.  CT concerning for small right apical pneumothorax.  UA consistent with UTI.  Will send culture.  Will start on ceftriaxone.  Discussed with family how aggressive they want him to be given her age but they would like her to be admitted for IV antibiotics and to follow this apical pneumothorax.  At this time her oxygen saturations are normal suspect that this was just an incidental finding.  Admit to hospital   ____________________________________________   FINAL CLINICAL IMPRESSION(S) / ED DIAGNOSES   Final diagnoses:  Acute cystitis without hematuria  Weakness      MEDICATIONS GIVEN DURING THIS VISIT:  Medications  sodium chloride 0.9 % bolus 500 mL (500 mLs Intravenous New Bag/Given 03/07/19 1623)  cefTRIAXone (ROCEPHIN) 1 g in sodium chloride 0.9 % 100 mL IVPB (0 g Intravenous Stopped 03/07/19 1725)     ED Discharge Orders    None       Note:  This  document was prepared using Dragon voice recognition software and may include unintentional dictation errors.   Concha Se, MD 03/07/19 [phone]

## 2019-03-07 NOTE — ED Notes (Signed)
Patient transported to CT 

## 2019-03-07 NOTE — ED Notes (Signed)
Pt checked and dry at this time. 

## 2019-03-07 NOTE — H&P (Addendum)
History and Physical    Yvonne Edwards HWE:[id number] DOB: 1918-07-07 DOA: 03/07/2019  PCP: Tracie Harrier, MD  Patient coming from: [facility] facility, a female close relative is at bedside  I have personally briefly reviewed patient's old medical records in [place]  Chief Complaint: fever, weakness and altered mental status  HPI: Yvonne Edwards is a 85 y.o. female with medical history significant of bilateral hearing loss, age-related macular degeneration, hyperlipidemia, chronic diarrhea who was sent to ED by her assisted living facility for concerns of fever, weakness and increased confusion.  Relative at bedside states that normally she can converse well and do daily ADLs on her own.  Today, patient is visibly more confused and unable to answer any questions or follow simple commands. She was recently evaluated in the ED 2 days ago for diarrhea, decreased oral intake and some abdominal pain as well as confusion.,  She had mild epigastric discomfort but had labs and CT abdomen and UA that was largely unremarkable and patient was discharged back to her facility after some IV fluids. Patient reportedly did not improve following this ED evaluation and continued to have weakness, worsening altered mental status and decreased p.o. intake.  ED Course: She was afebrile and normotensive on room air.  CBC shows no leukocytosis or anemia.  CMP showed potassium of 3.3, glucose of 109, normal creatinine of 0.79.  Troponin of 17 and 16.  Lactic acid of 1.5.  Urinalysis compared to 2 days ago now shows trace leukocyte, negative nitrite and many bacteria. EKG shows normal sinus rhythm.   CT head shows no acute intracranial findings. CT spine showed no acute cervical spine pathology but did show an incidental 5 mm right apical pneumothorax.  Review of Systems:  Unable to obtain given patient's altered mental status  Past Medical History:  Diagnosis Date  . Hearing loss   .  Hyperlipidemia    unspecified  . Macular degeneration     Past Surgical History:  Procedure Laterality Date  . CHOLECYSTECTOMY  12/17/2012  . DILATION AND CURETTAGE, DIAGNOSTIC / THERAPEUTIC  1976  . JOINT REPLACEMENT     HIp surgery in August  . TOTAL HIP ARTHROPLASTY Right 12/05/2016   Procedure: TOTAL HIP ARTHROPLASTY ANTERIOR APPROACH;  Surgeon: Hessie Knows, MD;  Location: [facility] ORS;  Service: Orthopedics;  Laterality: Right;    Unable to obtain social history given patient's altered mental status  Allergies  Allergen Reactions  . Biaxin [Clarithromycin]   . Codeine Other (See Comments)  . Shellfish Allergy Nausea And Vomiting    Family History  Problem Relation Age of Onset  . Aneurysm Mother   . Breast cancer Sister   . Ovarian cancer Sister   . CAD Sister   . Stroke Sister   . Lung cancer Brother   . Heart disease Father   . Stroke Father      Prior to Admission medications   Medication Sig Start Date End Date Taking? Authorizing Provider  acetaminophen (TYLENOL) 325 MG tablet Take 2 tablets (650 mg total) by mouth every 6 (six) hours as needed for mild pain (or Fever >/= 101). 12/07/16  Yes Theodoro Grist, MD  calcium-vitamin D (OSCAL 500/200 D-3) 500-200 MG-UNIT tablet Take 1 tablet by mouth 2 (two) times daily. Patient taking differently: Take 1 tablet by mouth daily.  12/07/16 03/07/19 Yes Theodoro Grist, MD  Cholecalciferol 100 MCG (4000 UT) TABS Take 4,000 Units by mouth daily.    Yes [provider]  docusate sodium (COLACE) 100 MG capsule Take 1 capsule (100 mg total) by mouth 2 (two) times daily. Patient taking differently: Take 100 mg by mouth 2 (two) times daily as needed.  12/07/16  Yes Katharina Caper, MD  loperamide (IMODIUM A-D) 2 MG tablet Take 2 mg by mouth every 6 (six) hours as needed.  07/03/17  Yes [provider]  methocarbamol (ROBAXIN) 500 MG tablet Take 1 tablet (500 mg total) by mouth every 6 (six) hours as needed for muscle  spasms. 12/07/16  Yes Katharina Caper, MD  mirtazapine (REMERON) 7.5 MG tablet Take 7.5 mg by mouth at bedtime.   Yes [provider]  Multiple Vitamins-Minerals (PRESERVISION AREDS 2 PO) Take 1 capsule by mouth 2 (two) times daily.    Yes [provider]  naproxen sodium (ANAPROX) 220 MG tablet Take 220 mg by mouth daily as needed.   Yes [provider]  Nutritional Supplements (ENSURE ENLIVE PO) Take 1 Bottle by mouth 2 (two) times daily.   Yes [provider]  rosuvastatin (CRESTOR) 10 MG tablet Take 10 mg by mouth at bedtime.  04/06/15  Yes [provider]  vitamin C (ASCORBIC ACID) 500 MG tablet Take 500 mg by mouth 2 (two) times daily.   Yes [provider]  Zinc Sulfate 220 (50 Zn) MG TABS Take 220 mg by mouth daily.   Yes [provider]    Physical Exam: Vitals:   03/07/19 1542 03/07/19 1630 03/07/19 1752 03/07/19 1830  BP:   (!) 148/66 (!) 122/55  Pulse:  89 81 80  Resp:  19 (!) 25 (!) 23  Temp: 98.1 F (36.7 C)     TempSrc: Oral     SpO2:  98% 95% 93%  Weight:      Height:        Constitutional: NAD, calm, comfortable, nontoxic appearing thin frail elderly female sitting upright in bed Vitals:   03/07/19 1542 03/07/19 1630 03/07/19 1752 03/07/19 1830  BP:   (!) 148/66 (!) 122/55  Pulse:  89 81 80  Resp:  19 (!) 25 (!) 23  Temp: 98.1 F (36.7 C)     TempSrc: Oral     SpO2:  98% 95% 93%  Weight:      Height:       Eyes: PERRL, lids and conjunctivae normal ENMT: Mucous membranes are moist. Posterior pharynx clear of any exudate or lesions. Neck: normal, supple, no masses Respiratory: clear to auscultation bilaterally, no wheezing, no crackles. Normal respiratory effort on room air. No accessory muscle use.  Cardiovascular: Regular rate and rhythm, no murmurs / rubs / gallops. No extremity edema. 2+ pedal pulses.   Abdomen: no tenderness, no masses palpated.Bowel sounds positive.  GU: Had on adult diapers  Musculoskeletal: no clubbing / cyanosis. No joint deformity upper and lower extremities. Good ROM, no contractures. Normal muscle tone.  Skin: no rashes, lesions, ulcers. No induration Neurologic: CN 2-12 grossly intact.  Weak bilateral hand grasp.  Patient able to lift up bilateral lower extremity but unable to follow instructions to do test for resistance.  Psychiatric: Patient is alert but only able to moan when asked questions.    Labs on Admission: I have personally reviewed following labs and imaging studies  CBC: Recent Labs  Lab 03/05/19 1554 03/07/19 1526  WBC 6.7 7.6  NEUTROABS 5.4 6.3  HGB 15.0 14.9  HCT 46.7* 44.4  MCV 90.2 86.0  PLT 167 202   Basic Metabolic Panel:  Recent Labs  Lab 03/05/19 1554 03/07/19 1526  NA 136 135  K 4.1 3.3*  CL 98 95*  CO2 24 27  GLUCOSE 96 109*  BUN 24* 18  CREATININE 1.02* 0.79  CALCIUM 9.2 9.0  MG  --  1.8   GFR: Estimated Creatinine Clearance: 25.3 mL/min (by C-G formula based on SCr of 0.79 mg/dL). Liver Function Tests: Recent Labs  Lab 03/05/19 1554 03/07/19 1526  AST 38 32  ALT 23 21  ALKPHOS 60 61  BILITOT 1.0 0.8  PROT 7.4 7.2  ALBUMIN 3.7 3.2*   Recent Labs  Lab 03/05/19 1554 03/07/19 1526  LIPASE 65* 84*   No results for input(s): AMMONIA in the last 168 hours. Coagulation Profile: No results for input(s): INR, PROTIME in the last 168 hours. Cardiac Enzymes: No results for input(s): CKTOTAL, CKMB, CKMBINDEX, TROPONINI in the last 168 hours. BNP (last 3 results) No results for input(s): PROBNP in the last 8760 hours. HbA1C: No results for input(s): HGBA1C in the last 72 hours. CBG: No results for input(s): GLUCAP in the last 168 hours. Lipid Profile: No results for input(s): CHOL, HDL, LDLCALC, TRIG, CHOLHDL, LDLDIRECT in the last 72 hours. Thyroid Function Tests: No results for input(s): TSH, T4TOTAL, FREET4, T3FREE, THYROIDAB in the last 72 hours. Anemia Panel: No results for input(s):  VITAMINB12, FOLATE, FERRITIN, TIBC, IRON, RETICCTPCT in the last 72 hours. Urine analysis:    Component Value Date/Time   COLORURINE YELLOW (A) 03/07/2019 1526   APPEARANCEUR CLOUDY (A) 03/07/2019 1526   LABSPEC 1.019 03/07/2019 1526   PHURINE 6.0 03/07/2019 1526   GLUCOSEU NEGATIVE 03/07/2019 1526   HGBUR NEGATIVE 03/07/2019 1526   BILIRUBINUR NEGATIVE 03/07/2019 1526   KETONESUR 20 (A) 03/07/2019 1526   PROTEINUR 30 (A) 03/07/2019 1526   NITRITE NEGATIVE 03/07/2019 1526   LEUKOCYTESUR TRACE (A) 03/07/2019 1526    Radiological Exams on Admission: Ct Head Wo Contrast  Result Date: 03/07/2019 CLINICAL DATA:  85 year old female with altered mental status. EXAM: CT HEAD WITHOUT CONTRAST CT CERVICAL SPINE WITHOUT CONTRAST TECHNIQUE: Multidetector CT imaging of the head and cervical spine was performed following the standard protocol without intravenous contrast. Multiplanar CT image reconstructions of the cervical spine were also generated. COMPARISON:  None. FINDINGS: CT HEAD FINDINGS Brain: Moderate to advanced age-related atrophy and chronic microvascular ischemic changes. There is no acute intracranial hemorrhage. No mass effect or midline shift. No extra-axial fluid collection. Vascular: No hyperdense vessel or unexpected calcification. Skull: Normal. Negative for fracture or focal lesion. Sinuses/Orbits: No acute finding. Other: None CT CERVICAL SPINE FINDINGS Alignment: No acute subluxation. Grade 1 C3-C4 anterolisthesis. Skull base and vertebrae: No acute fracture. Osteopenia. Soft tissues and spinal canal: No prevertebral fluid or swelling. No visible canal hematoma. Disc levels: Multilevel degenerative changes with endplate irregularity and disc space narrowing. Upper chest: Biapical subpleural scarring. There is a 5 mm right apical pneumothorax. This is new compared to the CT of 12/09/2016. Other: Bilateral carotid bulb calcified plaques. IMPRESSION: 1. No acute intracranial hemorrhage.  Moderate to advanced age-related atrophy and chronic microvascular ischemic changes. 2. No acute/traumatic cervical spine pathology. Multilevel degenerative changes. 3. A 5 mm right apical pneumothorax. These results were called by telephone at the time of interpretation on 03/07/2019 at 4:29 pm to provider [facility] , who verbally acknowledged these results. Electronically Signed   By: Elgie Collard M.D.   On: 03/07/2019 16:30   Ct Cervical Spine Wo Contrast  Result Date: 03/07/2019 CLINICAL DATA:  85 year old  female with altered mental status. EXAM: CT HEAD WITHOUT CONTRAST CT CERVICAL SPINE WITHOUT CONTRAST TECHNIQUE: Multidetector CT imaging of the head and cervical spine was performed following the standard protocol without intravenous contrast. Multiplanar CT image reconstructions of the cervical spine were also generated. COMPARISON:  None. FINDINGS: CT HEAD FINDINGS Brain: Moderate to advanced age-related atrophy and chronic microvascular ischemic changes. There is no acute intracranial hemorrhage. No mass effect or midline shift. No extra-axial fluid collection. Vascular: No hyperdense vessel or unexpected calcification. Skull: Normal. Negative for fracture or focal lesion. Sinuses/Orbits: No acute finding. Other: None CT CERVICAL SPINE FINDINGS Alignment: No acute subluxation. Grade 1 C3-C4 anterolisthesis. Skull base and vertebrae: No acute fracture. Osteopenia. Soft tissues and spinal canal: No prevertebral fluid or swelling. No visible canal hematoma. Disc levels: Multilevel degenerative changes with endplate irregularity and disc space narrowing. Upper chest: Biapical subpleural scarring. There is a 5 mm right apical pneumothorax. This is new compared to the CT of 12/09/2016. Other: Bilateral carotid bulb calcified plaques. IMPRESSION: 1. No acute intracranial hemorrhage. Moderate to advanced age-related atrophy and chronic microvascular ischemic changes. 2. No acute/traumatic cervical spine  pathology. Multilevel degenerative changes. 3. A 5 mm right apical pneumothorax. These results were called by telephone at the time of interpretation on 03/07/2019 at 4:29 pm to [facility] , who verbally acknowledged these results. Electronically Signed   By: Elgie CollardArash  Radparvar M.D.   On: 03/07/2019 16:30   Dg Chest Portable 1 View  Result Date: 03/07/2019 CLINICAL DATA:  Fever and weakness. EXAM: PORTABLE CHEST 1 VIEW COMPARISON:  Chest x-ray dated December 09, 2016. FINDINGS: The heart size and mediastinal contours are within normal limits. Atherosclerotic calcification of the thoracic aorta. Normal pulmonary vascularity. No focal consolidation, pleural effusion, or pneumothorax. No acute osseous abnormality. IMPRESSION: No active disease. Electronically Signed   By: Obie DredgeWilliam T Derry M.D.   On: 03/07/2019 15:59    EKG: Independently reviewed.   Assessment/Plan Altered mental status likely secondary to UTI -Patient also has mildly elevated lipase compared to 2 days ago but she had a CT abdomen at that time with no acute findings.  Also no abdominal discomfort on exam. -Continue IV Rocephin -Urine culture pending  Right 5 mm apical pneumothorax -Incidental finding on CT spine.  Patient is not hypoxic and not tachypneic on exam with oxygen saturation of 98% -Continue to monitor vitals  Hypokalemia -Potassium of 3.3 on admission -Replete with potassium supplementation  Chronic diarrhea - pt is on immodium for this but given fever and diarrhea will check C.diff  Hyperlipidemia -Continue rosuvastatin  Dementia -Continue mirtazapine  Protein calorie malnutrition -Moderate.  Will consult dietitian.  DVT prophylaxis:.Lovenox Code Status:DNR Family Communication: Plan discussed with family member at bedside.  Disposition Plan: Home with at least 2 midnight stays  Consults called:  Admission status: inpatient   Dalvin Clipper T Miley Lindon DO [facility]   If 7PM-7AM, please contact  night-coverage [email or web address] Password [email or web address]  03/07/2019, 6:40 PM

## 2019-03-08 ENCOUNTER — Other Ambulatory Visit: Payer: Self-pay

## 2019-03-08 ENCOUNTER — Encounter (HOSPITAL_COMMUNITY): Payer: Self-pay

## 2019-03-08 ENCOUNTER — Inpatient Hospital Stay (HOSPITAL_COMMUNITY)
Admission: AD | Admit: 2019-03-08 | Discharge: 2019-03-19 | DRG: 689 | Disposition: A | Discharge status: Hospice | Payer: Medicare Other | Source: Other Acute Inpatient Hospital | Attending: Internal Medicine | Admitting: Internal Medicine

## 2019-03-08 ENCOUNTER — Inpatient Hospital Stay: Payer: Medicare Other

## 2019-03-08 DIAGNOSIS — U071 COVID-19: Secondary | ICD-10-CM | POA: Diagnosis present

## 2019-03-08 DIAGNOSIS — E785 Hyperlipidemia, unspecified: Secondary | ICD-10-CM | POA: Diagnosis present

## 2019-03-08 DIAGNOSIS — Z6824 Body mass index (BMI) 24.0-24.9, adult: Secondary | ICD-10-CM

## 2019-03-08 DIAGNOSIS — R627 Adult failure to thrive: Secondary | ICD-10-CM | POA: Diagnosis present

## 2019-03-08 DIAGNOSIS — J9383 Other pneumothorax: Secondary | ICD-10-CM

## 2019-03-08 DIAGNOSIS — Z515 Encounter for palliative care: Secondary | ICD-10-CM | POA: Diagnosis not present

## 2019-03-08 DIAGNOSIS — J939 Pneumothorax, unspecified: Secondary | ICD-10-CM | POA: Diagnosis present

## 2019-03-08 DIAGNOSIS — Z66 Do not resuscitate: Secondary | ICD-10-CM | POA: Diagnosis present

## 2019-03-08 DIAGNOSIS — N390 Urinary tract infection, site not specified: Secondary | ICD-10-CM

## 2019-03-08 DIAGNOSIS — K529 Noninfective gastroenteritis and colitis, unspecified: Secondary | ICD-10-CM | POA: Diagnosis present

## 2019-03-08 DIAGNOSIS — Z23 Encounter for immunization: Secondary | ICD-10-CM

## 2019-03-08 DIAGNOSIS — F0390 Unspecified dementia without behavioral disturbance: Secondary | ICD-10-CM | POA: Diagnosis present

## 2019-03-08 DIAGNOSIS — Z9049 Acquired absence of other specified parts of digestive tract: Secondary | ICD-10-CM

## 2019-03-08 DIAGNOSIS — G9341 Metabolic encephalopathy: Secondary | ICD-10-CM

## 2019-03-08 DIAGNOSIS — N3000 Acute cystitis without hematuria: Secondary | ICD-10-CM

## 2019-03-08 DIAGNOSIS — H9193 Unspecified hearing loss, bilateral: Secondary | ICD-10-CM | POA: Diagnosis present

## 2019-03-08 DIAGNOSIS — Z96641 Presence of right artificial hip joint: Secondary | ICD-10-CM | POA: Diagnosis present

## 2019-03-08 DIAGNOSIS — J1289 Other viral pneumonia: Secondary | ICD-10-CM | POA: Diagnosis present

## 2019-03-08 DIAGNOSIS — B961 Klebsiella pneumoniae [K. pneumoniae] as the cause of diseases classified elsewhere: Secondary | ICD-10-CM | POA: Diagnosis present

## 2019-03-08 DIAGNOSIS — E876 Hypokalemia: Secondary | ICD-10-CM | POA: Diagnosis present

## 2019-03-08 DIAGNOSIS — E440 Moderate protein-calorie malnutrition: Secondary | ICD-10-CM | POA: Diagnosis present

## 2019-03-08 DIAGNOSIS — H3530 Unspecified macular degeneration: Secondary | ICD-10-CM | POA: Diagnosis present

## 2019-03-08 LAB — BASIC METABOLIC PANEL
Anion gap: 12 (ref 5–15)
BUN: 19 mg/dL (ref 8–23)
CO2: 26 mmol/L (ref 22–32)
Calcium: 8.6 mg/dL — ABNORMAL LOW (ref 8.9–10.3)
Chloride: 100 mmol/L (ref 98–111)
Creatinine, Ser: 0.82 mg/dL (ref 0.44–1.00)
GFR calc Af Amer: >60 mL/min (ref >60)
GFR calc non Af Amer: 60 mL/min — ABNORMAL LOW (ref >60)
Glucose, Bld: 95 mg/dL (ref 70–99)
Potassium: 3.9 mmol/L (ref 3.5–5.1)
Sodium: 138 mmol/L (ref 135–145)

## 2019-03-08 LAB — FERRITIN: Ferritin: 791 ng/mL — ABNORMAL HIGH (ref 11–307)

## 2019-03-08 LAB — CBC
HCT: 43.9 % (ref 36.0–46.0)
Hemoglobin: 14.4 g/dL (ref 12.0–15.0)
MCH: 29.1 pg (ref 26.0–34.0)
MCHC: 32.8 g/dL (ref 30.0–36.0)
MCV: 88.9 fL (ref 80.0–100.0)
Platelets: 181 10*3/uL (ref 150–400)
RBC: 4.94 MIL/uL (ref 3.87–5.11)
RDW: 12.6 % (ref 11.5–15.5)
WBC: 5.5 10*3/uL (ref 4.0–10.5)
nRBC: 0 % (ref 0.0–0.2)

## 2019-03-08 LAB — FIBRINOGEN: Fibrinogen: 659 mg/dL — ABNORMAL HIGH (ref 210–475)

## 2019-03-08 LAB — SARS CORONAVIRUS 2 (TAT 6-24 HRS): SARS Coronavirus 2: POSITIVE — AB

## 2019-03-08 LAB — C-REACTIVE PROTEIN: CRP: 10.9 mg/dL — ABNORMAL HIGH (ref <1.0)

## 2019-03-08 LAB — BRAIN NATRIURETIC PEPTIDE: B Natriuretic Peptide: 134 pg/mL — ABNORMAL HIGH (ref 0.0–100.0)

## 2019-03-08 LAB — HEPATITIS B SURFACE ANTIGEN: Hepatitis B Surface Ag: NONREACTIVE

## 2019-03-08 LAB — LACTATE DEHYDROGENASE: LDH: 170 U/L (ref 98–192)

## 2019-03-08 LAB — ABO/RH: ABO/RH(D): A POS

## 2019-03-08 LAB — FIBRIN DERIVATIVES D-DIMER (ARMC ONLY): Fibrin derivatives D-dimer (ARMC): 884.04 ng/mL (FEU) — ABNORMAL HIGH (ref 0.00–499.00)

## 2019-03-08 LAB — PROCALCITONIN: Procalcitonin: <0.1 ng/mL

## 2019-03-08 MED ORDER — ONDANSETRON HCL 4 MG PO TABS: 4.0000 mg | ORAL_TABLET | Freq: Four times a day (QID) | ORAL | Status: DC | PRN

## 2019-03-08 MED ORDER — ONDANSETRON HCL 4 MG/2ML IJ SOLN
4.0000 mg | Freq: Four times a day (QID) | INTRAMUSCULAR | Status: DC | PRN
  Administered 2019-03-09: 13:00:00 4 mg via INTRAVENOUS
  Filled 2019-03-08: qty 2

## 2019-03-08 MED ORDER — SODIUM CHLORIDE 0.9% FLUSH
3.0000 mL | INTRAVENOUS | Status: DC | PRN
  Administered 2019-03-15: 3 mL via INTRAVENOUS
  Filled 2019-03-08: qty 3

## 2019-03-08 MED ORDER — ACETAMINOPHEN 325 MG PO TABS: 650.0000 mg | ORAL_TABLET | Freq: Four times a day (QID) | ORAL | Status: DC | PRN

## 2019-03-08 MED ORDER — SODIUM CHLORIDE 0.9 % IV SOLN
1.0000 g | INTRAVENOUS | Status: AC
  Administered 2019-03-08 – 2019-03-11 (×4): 1 g via INTRAVENOUS
  Filled 2019-03-08 (×3): qty 10
  Filled 2019-03-08: qty 1
  Filled 2019-03-08: qty 10

## 2019-03-08 MED ORDER — GUAIFENESIN-DM 100-10 MG/5ML PO SYRP: 10.0000 mL | ORAL_SOLUTION | ORAL | Status: DC | PRN

## 2019-03-08 MED ORDER — ENOXAPARIN SODIUM 30 MG/0.3ML ~~LOC~~ SOLN: 30.0000 mg | SUBCUTANEOUS | Status: DC

## 2019-03-08 MED ORDER — SODIUM CHLORIDE 0.9 % IV SOLN: 250.0000 mL | INTRAVENOUS | Status: DC | PRN

## 2019-03-08 MED ORDER — ZINC SULFATE 220 (50 ZN) MG PO CAPS
220.0000 mg | ORAL_CAPSULE | Freq: Every day | ORAL | Status: DC
  Administered 2019-03-08: 14:00:00 220 mg via ORAL
  Filled 2019-03-08: qty 1

## 2019-03-08 MED ORDER — SODIUM CHLORIDE 0.9 % IV SOLN: 1.0000 g | INTRAVENOUS | Status: DC

## 2019-03-08 MED ORDER — VITAMIN C 500 MG PO TABS
500.0000 mg | ORAL_TABLET | Freq: Every day | ORAL | Status: DC
  Administered 2019-03-08 – 2019-03-15 (×8): 500 mg via ORAL
  Filled 2019-03-08 (×8): qty 1

## 2019-03-08 MED ORDER — VITAMIN C 500 MG PO TABS
500.0000 mg | ORAL_TABLET | Freq: Every day | ORAL | Status: DC
  Administered 2019-03-08: 500 mg via ORAL
  Filled 2019-03-08: qty 1

## 2019-03-08 MED ORDER — SODIUM CHLORIDE 0.9% FLUSH
3.0000 mL | Freq: Two times a day (BID) | INTRAVENOUS | Status: DC
  Administered 2019-03-08 – 2019-03-18 (×18): 3 mL via INTRAVENOUS

## 2019-03-08 MED ORDER — ZINC SULFATE 220 (50 ZN) MG PO CAPS
220.0000 mg | ORAL_CAPSULE | Freq: Every day | ORAL | Status: DC
  Administered 2019-03-08 – 2019-03-15 (×8): 220 mg via ORAL
  Filled 2019-03-08 (×9): qty 1

## 2019-03-08 NOTE — Progress Notes (Signed)
Provider (bodenheimer) notified of positive covid result.

## 2019-03-08 NOTE — H&P (Signed)
History and Physical    Yvonne Edwards HYQ:[id number] DOB: 16-Feb-1919 DOA: 03/08/2019  PCP: [name], MD  Patient coming from:  Assisted living  Chief Complaint:  Ams, fever  HPI: Yvonne Edwards is a 85 y.o. female with medical history significant of deafness, chronic diarrhea, dementia, hld sent in for fever, weakness and increased confusion.  Pt cannot provide history due to her confusion.  All history obtained from records.  She has had dec po intake and worsening diarrhea.  Found to have uti and given rocephin.  uc with gnr prelim.  Also found to have covid infection.  Review of Systems:  Unobtainable due to ams   Past Medical History:  Diagnosis Date  . Hearing loss   . Hyperlipidemia    unspecified  . Macular degeneration     Past Surgical History:  Procedure Laterality Date  . CHOLECYSTECTOMY  12/17/2012  . DILATION AND CURETTAGE, DIAGNOSTIC / THERAPEUTIC  1976  . JOINT REPLACEMENT     HIp surgery in August  . TOTAL HIP ARTHROPLASTY Right 12/05/2016   Procedure: TOTAL HIP ARTHROPLASTY ANTERIOR APPROACH;  Surgeon: Kennedy Bucker, MD;  Location: [facility] ORS;  Service: Orthopedics;  Laterality: Right;     reports that she has never smoked. She has never used smokeless tobacco. She reports that she does not drink alcohol or use drugs.  Allergies  Allergen Reactions  . Biaxin [Clarithromycin]   . Codeine Other (See Comments)  . Shellfish Allergy Nausea And Vomiting    Family History  Problem Relation Age of Onset  . Aneurysm Mother   . Breast cancer Sister   . Ovarian cancer Sister   . CAD Sister   . Stroke Sister   . Lung cancer Brother   . Heart disease Father   . Stroke Father     Prior to Admission medications   Medication Sig Start Date End Date Taking? Authorizing Provider  acetaminophen (TYLENOL) 325 MG tablet Take 2 tablets (650 mg total) by mouth every 6 (six) hours as needed for mild pain (or Fever >/= 101). 12/07/16   Katharina Caper, MD   calcium-vitamin D (OSCAL 500/200 D-3) 500-200 MG-UNIT tablet Take 1 tablet by mouth 2 (two) times daily. Patient taking differently: Take 1 tablet by mouth daily.  12/07/16 03/07/19  Katharina Caper, MD  cefTRIAXone 1 g in sodium chloride 0.9 % 100 mL Inject 1 g into the vein daily. 03/08/19   Lorretta Harp, MD  Cholecalciferol 100 MCG (4000 UT) TABS Take 4,000 Units by mouth daily.     [provider]  enoxaparin (LOVENOX) 30 MG/0.3ML injection Inject 0.3 mLs (30 mg total) into the skin daily. 03/09/19   Lorretta Harp, MD  methocarbamol (ROBAXIN) 500 MG tablet Take 1 tablet (500 mg total) by mouth every 6 (six) hours as needed for muscle spasms. 12/07/16   Katharina Caper, MD  mirtazapine (REMERON) 7.5 MG tablet Take 7.5 mg by mouth at bedtime.    [provider]  Multiple Vitamins-Minerals (PRESERVISION AREDS 2 PO) Take 1 capsule by mouth 2 (two) times daily.     [provider]  Nutritional Supplements (ENSURE ENLIVE PO) Take 1 Bottle by mouth 2 (two) times daily.    [provider]  rosuvastatin (CRESTOR) 10 MG tablet Take 10 mg by mouth at bedtime.  04/06/15   [provider]  vitamin C (ASCORBIC ACID) 500 MG tablet Take 500 mg by mouth 2 (two) times daily.    [provider]  Zinc  Sulfate 220 (50 Zn) MG TABS Take 220 mg by mouth daily.    [provider]    Physical Exam: Vitals:   03/08/19 1900  BP: (!) 142/61  Pulse: 85  Resp: 20  Temp: (!) 97.5 F (36.4 C)  TempSrc: Oral  SpO2: 91%      Constitutional: NAD, calm, comfortable Vitals:   03/08/19 1900  BP: (!) 142/61  Pulse: 85  Resp: 20  Temp: (!) 97.5 F (36.4 C)  TempSrc: Oral  SpO2: 91%   Eyes: PERRL, lids and conjunctivae normal ENMT: Mucous membranes are moist. Posterior pharynx clear of any exudate or lesions.Normal dentition.  Neck: normal, supple, no masses, no thyromegaly Respiratory: clear to auscultation bilaterally, no wheezing, no crackles. Normal  respiratory effort. No accessory muscle use.  Cardiovascular: Regular rate and rhythm, no murmurs / rubs / gallops. No extremity edema. 2+ pedal pulses. No carotid bruits.  Abdomen: no tenderness, no masses palpated. No hepatosplenomegaly. Bowel sounds positive.  Musculoskeletal: no clubbing / cyanosis. No joint deformity upper and lower extremities. Good ROM, no contractures. Normal muscle tone.  Skin: no rashes, lesions, ulcers. No induration Neurologic: CN 2-12 grossly intact. Sensation intact, DTR normal. Strength 5/5 in all 4.  Psychiatric: not Normal judgment and insight. Alert and oriented x 0. Normal mood.    Labs on Admission: I have personally reviewed following labs and imaging studies  CBC: Recent Labs  Lab 03/05/19 1554 03/07/19 1526 03/08/19 0553  WBC 6.7 7.6 5.5  NEUTROABS 5.4 6.3  --   HGB 15.0 14.9 14.4  HCT 46.7* 44.4 43.9  MCV 90.2 86.0 88.9  PLT 167 202 181   Basic Metabolic Panel: Recent Labs  Lab 03/05/19 1554 03/07/19 1526 03/08/19 0553  NA 136 135 138  K 4.1 3.3* 3.9  CL 98 95* 100  CO2 GLUCOSE 96 109* 95  BUN 24* 18 19  CREATININE 1.02* 0.79 0.82  CALCIUM 9.2 9.0 8.6*  MG  --  1.8  --    GFR: Estimated Creatinine Clearance: 24.7 mL/min (by C-G formula based on SCr of 0.82 mg/dL). Liver Function Tests: Recent Labs  Lab 03/05/19 1554 03/07/19 1526  AST 38 32  ALT 23 21  ALKPHOS 60 61  BILITOT 1.0 0.8  PROT 7.4 7.2  ALBUMIN 3.7 3.2*   Recent Labs  Lab 03/05/19 1554 03/07/19 1526  LIPASE 65* 84*   No results for input(s): AMMONIA in the last 168 hours. Coagulation Profile: No results for input(s): INR, PROTIME in the last 168 hours. Cardiac Enzymes: No results for input(s): CKTOTAL, CKMB, CKMBINDEX, TROPONINI in the last 168 hours. BNP (last 3 results) No results for input(s): PROBNP in the last 8760 hours. HbA1C: No results for input(s): HGBA1C in the last 72 hours. CBG: No results for input(s): GLUCAP in the last  168 hours. Lipid Profile: No results for input(s): CHOL, HDL, LDLCALC, TRIG, CHOLHDL, LDLDIRECT in the last 72 hours. Thyroid Function Tests: No results for input(s): TSH, T4TOTAL, FREET4, T3FREE, THYROIDAB in the last 72 hours. Anemia Panel: Recent Labs    03/08/19 1359  FERRITIN 791*   Urine analysis:    Component Value Date/Time   COLORURINE YELLOW (A) 03/07/2019 1526   APPEARANCEUR CLOUDY (A) 03/07/2019 1526   LABSPEC 1.019 03/07/2019 1526   PHURINE 6.0 03/07/2019 1526   GLUCOSEU NEGATIVE 03/07/2019 1526   HGBUR NEGATIVE 03/07/2019 1526   BILIRUBINUR NEGATIVE 03/07/2019 1526   KETONESUR 20 (A) 03/07/2019 1526   PROTEINUR  30 (A) 03/07/2019 1526   NITRITE NEGATIVE 03/07/2019 1526   LEUKOCYTESUR TRACE (A) 03/07/2019 1526   Sepsis Labs: !!!!!!!!!!!!!!!!!!!!!!!!!!!!!!!!!!!!!!!!!!!! @LABRCNTIP (procalcitonin:4,lacticidven:4) ) Recent Results (from the past 240 hour(s))  Urine culture     Status: Abnormal (Preliminary result)   Collection Time: 03/07/19  3:26 PM   Specimen: Urine, Catheterized  Result Value Ref Range Status   Specimen Description   Final    URINE, CATHETERIZED Performed at [facility], [address] [place], [place]    Special Requests   Final    NONE Performed at [facility], [address] [place], [place]    Culture >=100,000 COLONIES/mL GRAM NEGATIVE RODS (A)  Final   Report Status PENDING  Incomplete  SARS CORONAVIRUS 2 (TAT 6-24 HRS) Nasopharyngeal Nasopharyngeal Swab     Status: Abnormal   Collection Time: 03/07/19  6:32 PM   Specimen: Nasopharyngeal Swab  Result Value Ref Range Status   SARS Coronavirus 2 POSITIVE (A) NEGATIVE Final    Comment: RESULT CALLED TO, READ BACK BY AND VERIFIED WITH: WOODS B, RN AT 0348 ON 03/08/2019 BY SAINVILUS S (NOTE) SARS-CoV-2 target nucleic acids are DETECTED. The SARS-CoV-2 RNA is generally detectable in upper and lower respiratory specimens during the acute phase  of infection. Positive results are indicative of active infection with SARS-CoV-2. Clinical  correlation with patient history and other diagnostic information is necessary to determine patient infection status. Positive results do  not rule out bacterial infection or co-infection with other viruses. The expected result is Negative. Fact Sheet for Patients: 03/10/2019 Fact Sheet for Healthcare Providers: HairSlick.no This test is not yet approved or cleared by the quierodirigir.com FDA and  has been authorized for detection and/or diagnosis of SARS-CoV-2 by FDA under an Emergency Use Authorization (EUA). This EUA will remain  in effect (meaning this test can b e used) for the duration of the COVID-19 declaration under Section 564(b)(1) of the Act, 21 U.S.C. section 360bbb-3(b)(1), unless the authorization is terminated or revoked sooner. Performed at [facility], [address] [place], [place]      Radiological Exams on Admission: Ct Head Wo Contrast  Result Date: 03/07/2019 CLINICAL DATA:  85 year old female with altered mental status. EXAM: CT HEAD WITHOUT CONTRAST CT CERVICAL SPINE WITHOUT CONTRAST TECHNIQUE: Multidetector CT imaging of the head and cervical spine was performed following the standard protocol without intravenous contrast. Multiplanar CT image reconstructions of the cervical spine were also generated. COMPARISON:  None. FINDINGS: CT HEAD FINDINGS Brain: Moderate to advanced age-related atrophy and chronic microvascular ischemic changes. There is no acute intracranial hemorrhage. No mass effect or midline shift. No extra-axial fluid collection. Vascular: No hyperdense vessel or unexpected calcification. Skull: Normal. Negative for fracture or focal lesion. Sinuses/Orbits: No acute finding. Other: None CT CERVICAL SPINE FINDINGS Alignment: No acute subluxation. Grade 1 C3-C4 anterolisthesis. Skull  base and vertebrae: No acute fracture. Osteopenia. Soft tissues and spinal canal: No prevertebral fluid or swelling. No visible canal hematoma. Disc levels: Multilevel degenerative changes with endplate irregularity and disc space narrowing. Upper chest: Biapical subpleural scarring. There is a 5 mm right apical pneumothorax. This is new compared to the CT of 12/09/2016. Other: Bilateral carotid bulb calcified plaques. IMPRESSION: 1. No acute intracranial hemorrhage. Moderate to advanced age-related atrophy and chronic microvascular ischemic changes. 2. No acute/traumatic cervical spine pathology. Multilevel degenerative changes. 3. A 5 mm right apical pneumothorax. These results were called by telephone at the time of interpretation  on 03/07/2019 at 4:29 pm to provider [facility] , who verbally acknowledged these results. Electronically Signed   By: Anner Crete M.D.   On: 03/07/2019 16:30   Ct Cervical Spine Wo Contrast  Result Date: 03/07/2019 CLINICAL DATA:  85 year old female with altered mental status. EXAM: CT HEAD WITHOUT CONTRAST CT CERVICAL SPINE WITHOUT CONTRAST TECHNIQUE: Multidetector CT imaging of the head and cervical spine was performed following the standard protocol without intravenous contrast. Multiplanar CT image reconstructions of the cervical spine were also generated. COMPARISON:  None. FINDINGS: CT HEAD FINDINGS Brain: Moderate to advanced age-related atrophy and chronic microvascular ischemic changes. There is no acute intracranial hemorrhage. No mass effect or midline shift. No extra-axial fluid collection. Vascular: No hyperdense vessel or unexpected calcification. Skull: Normal. Negative for fracture or focal lesion. Sinuses/Orbits: No acute finding. Other: None CT CERVICAL SPINE FINDINGS Alignment: No acute subluxation. Grade 1 C3-C4 anterolisthesis. Skull base and vertebrae: No acute fracture. Osteopenia. Soft tissues and spinal canal: No prevertebral fluid or swelling. No  visible canal hematoma. Disc levels: Multilevel degenerative changes with endplate irregularity and disc space narrowing. Upper chest: Biapical subpleural scarring. There is a 5 mm right apical pneumothorax. This is new compared to the CT of 12/09/2016. Other: Bilateral carotid bulb calcified plaques. IMPRESSION: 1. No acute intracranial hemorrhage. Moderate to advanced age-related atrophy and chronic microvascular ischemic changes. 2. No acute/traumatic cervical spine pathology. Multilevel degenerative changes. 3. A 5 mm right apical pneumothorax. These results were called by telephone at the time of interpretation on 03/07/2019 at 4:29 pm to provider [facility] , who verbally acknowledged these results. Electronically Signed   By: Anner Crete M.D.   On: 03/07/2019 16:30   Dg Chest Port 1 View  Result Date: 03/08/2019 CLINICAL DATA:  COVID positive, evaluate for pneumothorax EXAM: PORTABLE CHEST 1 VIEW COMPARISON:  2018 FINDINGS: Chronic mild interstitial prominence. No pleural effusion or pneumothorax. Stable cardiomediastinal contours with normal heart size. Calcified plaque along the thoracic aorta. IMPRESSION: No pneumothorax. Electronically Signed   By: Macy Mis M.D.   On: 03/08/2019 08:35   Dg Chest Portable 1 View  Result Date: 03/07/2019 CLINICAL DATA:  Fever and weakness. EXAM: PORTABLE CHEST 1 VIEW COMPARISON:  Chest x-ray dated December 09, 2016. FINDINGS: The heart size and mediastinal contours are within normal limits. Atherosclerotic calcification of the thoracic aorta. Normal pulmonary vascularity. No focal consolidation, pleural effusion, or pneumothorax. No acute osseous abnormality. IMPRESSION: No active disease. Electronically Signed   By: Titus Dubin M.D.   On: 03/07/2019 15:59    Old chart reviewed cxr reviewed no edema or infiltrate  Assessment/Plan 85 yo female with uti, ams, and covid infection Principal Problem:   Acute lower UTI- cont rocephin.  uc with gnr.   Confusion likely due to this.  Active Problems:   Acute metabolic encephalopathy- likely multifactorial.  Treat uti as above, cth neg    COVID-19 virus infection- cxr neg.  02 sats nml.  Supportive care, vit c/zinc.  No steroids or other treatments at this time unless worsens    AMD (age related macular degeneration)- stable    Other pneumothorax- resolved in last 24 hours per imaging    Chronic diarrhea- with acute exac.  cdiff is pending.  abd ct with no acute issues    Dementia ([place])- noted     DVT prophylaxis:  scds Code Status: DNR Family Communication: none Disposition Plan:  A day Consults called:  none Admission status:  observation  DAVID,RACHAL A MD [facility]  If 7PM-7AM, please contact night-coverage [email or web address] Password [email or web address]  03/08/2019, 7:26 PM

## 2019-03-08 NOTE — ED Notes (Signed)
Lab at bedside for blood

## 2019-03-08 NOTE — Discharge Summary (Signed)
Physician Discharge Summary  Yvonne Edwards PYK:[id number] DOB: 1919-01-23 DOA: 03/07/2019  PCP: Tracie Harrier, MD  Admit date: 03/07/2019 Discharge date: 03/08/2019  Recommendations for Outpatient Follow-up:  1. None. Pt will be transferred to [facility]: none Equipment/Devices: none   Discharge Condition: will transfer to [facility] due to Covid 19 infection CODE STATUS: SNR Diet recommendation: regular diet  Brief/Interim Summary (HPI):  Yvonne Edwards is a 85 y.o. female with medical history significant of bilateral hearing loss, age-related macular degeneration, hyperlipidemia, chronic diarrhea who was sent to ED by her assisted living facility for concerns of fever, weakness and increased confusion.  Relative at bedside states that normally she can converse well and do daily ADLs on her own.  Today, patient is visibly more confused and unable to answer any questions or follow simple commands. She was recently evaluated in the ED 2 days ago for diarrhea, decreased oral intake and some abdominal pain as well as confusion.,  She had mild epigastric discomfort but had labs and CT abdomen and UA that was largely unremarkable and patient was discharged back to her facility after some IV fluids. Patient reportedly did not improve following this ED evaluation and continued to have weakness, worsening altered mental status and decreased p.o. intake.  ED Course: She was afebrile and normotensive on room air.  CBC shows no leukocytosis or anemia.  CMP showed potassium of 3.3, glucose of 109, normal creatinine of 0.79.  Troponin of 17 and 16.  Lactic acid of 1.5.  Urinalysis compared to 2 days ago now shows trace leukocyte, negative nitrite and many bacteria. EKG shows normal sinus rhythm.   CT head shows no acute intracranial findings. CT spine showed no acute cervical spine pathology but did show an incidental 5 mm right apical pneumothorax.  Subjective   Pt has dementia and confusion,  cannot provide accurate information.  Patient does not have active cough, respiratory distress.  She has mild abdominal tenderness on examination.  No active, nausea, vomiting noted.  Discharge Diagnoses and Hospital Course:   Principal Problem:   COVID-19 virus infection Active Problems:   Pure hypercholesterolemia   Acute metabolic encephalopathy   Hypokalemia   Protein calorie malnutrition (HCC)   Other pneumothorax   Acute lower UTI   Chronic diarrhea   COVID-19 virus infection: pt has positive covid 19 test, but she seems to be asymptomatic.  Oxygen saturation 94% on room air.  No active cough or respiratory distress. CXR with no infiltration for pneumonia. Trop 16.  -will transfer to [facility]. -vitamin C, zinc.   - albuterol inhaler -D-dimer, BNP, LFT, CRP, LDH, Procalcitonin, IL-6, ferritin, fibinogen, TG, Hep B SAg -Patient was seen wearing full PPE including: gown, gloves, head cover, N95  Altered mental status: likely secondary to UTI: -Frequent neuro check -On Rocephin for UTI  Right 5 mm apical pneumothorax: Incidental finding on CT spine.  Patient is not hypoxic and not tachypneic on exam. The repeated CXR is negative for pneumothorax -Continue to monitor vitals  Hypokalemia: Potassium of 3.3 on admission, repleted --> K 3.9 in AM  Chronic diarrhea and mild AP: pt is on immodium for this. CT of abdomen is unremarkable. -Checked C.diff on admission, pending results. -Hold Imodium until C diff negative -Hold laxatives   Hyperlipidemia -Continue rosuvastatin  Dementia -Continue mirtazapine  Protein calorie malnutrition: Moderate.  -consulted dietitian.  Discharge Instructions   Allergies as of 03/08/2019      Reactions   Biaxin [clarithromycin]  Codeine Other (See Comments)   Shellfish Allergy Nausea And Vomiting      Medication List    STOP taking these medications   docusate sodium 100 MG capsule Commonly known as: COLACE   loperamide 2 MG  tablet Commonly known as: IMODIUM A-D   naproxen sodium 220 MG tablet Commonly known as: ALEVE     TAKE these medications   acetaminophen 325 MG tablet Commonly known as: TYLENOL Take 2 tablets (650 mg total) by mouth every 6 (six) hours as needed for mild pain (or Fever >/= 101).   calcium-vitamin D 500-200 MG-UNIT tablet Commonly known as: Oscal 500/200 D-3 Take 1 tablet by mouth 2 (two) times daily. What changed: when to take this   cefTRIAXone 1 g in sodium chloride 0.9 % 100 mL Inject 1 g into the vein daily.   Cholecalciferol 100 MCG (4000 UT) Tabs Take 4,000 Units by mouth daily.   enoxaparin 30 MG/0.3ML injection Commonly known as: LOVENOX Inject 0.3 mLs (30 mg total) into the skin daily. Start taking on: March 09, 2019   ENSURE ENLIVE PO Take 1 Bottle by mouth 2 (two) times daily.   methocarbamol 500 MG tablet Commonly known as: ROBAXIN Take 1 tablet (500 mg total) by mouth every 6 (six) hours as needed for muscle spasms.   mirtazapine 7.5 MG tablet Commonly known as: REMERON Take 7.5 mg by mouth at bedtime.   PRESERVISION AREDS 2 PO Take 1 capsule by mouth 2 (two) times daily.   rosuvastatin 10 MG tablet Commonly known as: CRESTOR Take 10 mg by mouth at bedtime.   vitamin C 500 MG tablet Commonly known as: ASCORBIC ACID Take 500 mg by mouth 2 (two) times daily.   Zinc Sulfate 220 (50 Zn) MG Tabs Take 220 mg by mouth daily.       Allergies  Allergen Reactions  . Biaxin [Clarithromycin]   . Codeine Other (See Comments)  . Shellfish Allergy Nausea And Vomiting    Consultations:  none   Procedures/Studies: Ct Head Wo Contrast  Result Date: 03/07/2019 CLINICAL DATA:  85 year old female with altered mental status. EXAM: CT HEAD WITHOUT CONTRAST CT CERVICAL SPINE WITHOUT CONTRAST TECHNIQUE: Multidetector CT imaging of the head and cervical spine was performed following the standard protocol without intravenous contrast. Multiplanar CT  image reconstructions of the cervical spine were also generated. COMPARISON:  None. FINDINGS: CT HEAD FINDINGS Brain: Moderate to advanced age-related atrophy and chronic microvascular ischemic changes. There is no acute intracranial hemorrhage. No mass effect or midline shift. No extra-axial fluid collection. Vascular: No hyperdense vessel or unexpected calcification. Skull: Normal. Negative for fracture or focal lesion. Sinuses/Orbits: No acute finding. Other: None CT CERVICAL SPINE FINDINGS Alignment: No acute subluxation. Grade 1 C3-C4 anterolisthesis. Skull base and vertebrae: No acute fracture. Osteopenia. Soft tissues and spinal canal: No prevertebral fluid or swelling. No visible canal hematoma. Disc levels: Multilevel degenerative changes with endplate irregularity and disc space narrowing. Upper chest: Biapical subpleural scarring. There is a 5 mm right apical pneumothorax. This is new compared to the CT of 12/09/2016. Other: Bilateral carotid bulb calcified plaques. IMPRESSION: 1. No acute intracranial hemorrhage. Moderate to advanced age-related atrophy and chronic microvascular ischemic changes. 2. No acute/traumatic cervical spine pathology. Multilevel degenerative changes. 3. A 5 mm right apical pneumothorax. These results were called by telephone at the time of interpretation on 03/07/2019 at 4:29 pm to provider [facility] , who verbally acknowledged these results. Electronically Signed   By: Elgie Collard  M.D.   On: 03/07/2019 16:30   Ct Cervical Spine Wo Contrast  Result Date: 03/07/2019 CLINICAL DATA:  85 year old female with altered mental status. EXAM: CT HEAD WITHOUT CONTRAST CT CERVICAL SPINE WITHOUT CONTRAST TECHNIQUE: Multidetector CT imaging of the head and cervical spine was performed following the standard protocol without intravenous contrast. Multiplanar CT image reconstructions of the cervical spine were also generated. COMPARISON:  None. FINDINGS: CT HEAD FINDINGS Brain:  Moderate to advanced age-related atrophy and chronic microvascular ischemic changes. There is no acute intracranial hemorrhage. No mass effect or midline shift. No extra-axial fluid collection. Vascular: No hyperdense vessel or unexpected calcification. Skull: Normal. Negative for fracture or focal lesion. Sinuses/Orbits: No acute finding. Other: None CT CERVICAL SPINE FINDINGS Alignment: No acute subluxation. Grade 1 C3-C4 anterolisthesis. Skull base and vertebrae: No acute fracture. Osteopenia. Soft tissues and spinal canal: No prevertebral fluid or swelling. No visible canal hematoma. Disc levels: Multilevel degenerative changes with endplate irregularity and disc space narrowing. Upper chest: Biapical subpleural scarring. There is a 5 mm right apical pneumothorax. This is new compared to the CT of 12/09/2016. Other: Bilateral carotid bulb calcified plaques. IMPRESSION: 1. No acute intracranial hemorrhage. Moderate to advanced age-related atrophy and chronic microvascular ischemic changes. 2. No acute/traumatic cervical spine pathology. Multilevel degenerative changes. 3. A 5 mm right apical pneumothorax. These results were called by telephone at the time of interpretation on 03/07/2019 at 4:29 pm to provider [facility] , who verbally acknowledged these results. Electronically Signed   By: Elgie Collard M.D.   On: 03/07/2019 16:30   Ct Abdomen Pelvis W Contrast  Result Date: 03/05/2019 CLINICAL DATA:  Upper abdominal pain, diarrhea, decreased appetite EXAM: CT ABDOMEN AND PELVIS WITH CONTRAST TECHNIQUE: Multidetector CT imaging of the abdomen and pelvis was performed using the standard protocol following bolus administration of intravenous contrast. CONTRAST:  75mL OMNIPAQUE IOHEXOL 300 MG/ML  SOLN COMPARISON:  12/20/2012 FINDINGS: Technical note: Examination is degraded by patient motion artifact. Additional metallic streak/beam hardening artifact from right total hip arthroplasty hardware degrades  evaluation of the deep pelvis. Lower chest: Trace right pleural effusion with right basilar atelectasis and small dependent areas of airspace consolidation. Left lung base clear. Coronary artery calcifications. Hepatobiliary: No focal liver abnormality is seen. Status post cholecystectomy. No biliary dilatation. Pancreas: Grossly unremarkable. Spleen: Normal in size without focal abnormality. Adrenals/Urinary Tract: Adrenal glands and bilateral kidneys appear grossly unremarkable. Urinary bladder is partially obscured by artifact. No discrete abnormality. Stomach/Bowel: Negative for bowel obstruction. Small volume liquid stool within bowel suggesting diarrheal state. No definite bowel thickening or inflammatory changes are evident within the limitations of this exam. Vascular/Lymphatic: Aortic atherosclerosis. No enlarged abdominal or pelvic lymph nodes. Reproductive: No appreciable abnormality. No adnexal mass. Other: No ascites. No abdominal wall hernia. Musculoskeletal: Partially visualized right total hip arthroplasty hardware. Degenerative disc disease of the lumbar spine. There is a dextroscoliotic curvature of the lumbar spine. No acute osseous findings are evident. IMPRESSION: 1. Somewhat limited exam.  No acute abdominopelvic findings. 2. Trace right pleural effusion with right basilar atelectasis and small dependent areas of airspace consolidation. 3. Status post cholecystectomy. 4. Aortic atherosclerosis and coronary artery calcifications. Electronically Signed   By: Duanne Guess M.D.   On: 03/05/2019 17:17   Dg Chest Port 1 View  Result Date: 03/08/2019 CLINICAL DATA:  COVID positive, evaluate for pneumothorax EXAM: PORTABLE CHEST 1 VIEW COMPARISON:  2018 FINDINGS: Chronic mild interstitial prominence. No pleural effusion or pneumothorax. Stable cardiomediastinal contours with normal heart  size. Calcified plaque along the thoracic aorta. IMPRESSION: No pneumothorax. Electronically Signed   By:  Guadlupe SpanishPraneil  Patel M.D.   On: 03/08/2019 08:35   Dg Chest Portable 1 View  Result Date: 03/07/2019 CLINICAL DATA:  Fever and weakness. EXAM: PORTABLE CHEST 1 VIEW COMPARISON:  Chest x-ray dated December 09, 2016. FINDINGS: The heart size and mediastinal contours are within normal limits. Atherosclerotic calcification of the thoracic aorta. Normal pulmonary vascularity. No focal consolidation, pleural effusion, or pneumothorax. No acute osseous abnormality. IMPRESSION: No active disease. Electronically Signed   By: Obie DredgeWilliam T Derry M.D.   On: 03/07/2019 15:59      Discharge Exam: Vitals:   03/08/19 0240 03/08/19 0604  BP: (!) 165/67 (!) 166/67  Pulse: 87 85  Resp: (!) 21 20  Temp: 98.4 F (36.9 C) 98.3 F (36.8 C)  SpO2: 95% 94%   Vitals:   03/08/19 0000 03/08/19 0100 03/08/19 0240 03/08/19 0604  BP: (!) 158/67 (!) 110/57 (!) 165/67 (!) 166/67  Pulse: 81 77 87 85  Resp: (!) 23 (!) 22 (!) 21 20  Temp:   98.4 F (36.9 C) 98.3 F (36.8 C)  TempSrc:   Axillary Oral  SpO2: 94% 95% 95% 94%  Weight:      Height:        General: Pt is alert, awake, not in acute distress Cardiovascular: RRR, S1/S2 +, no rubs, no gallops Respiratory: CTA bilaterally, no wheezing, no rhonchi Abdominal: Soft, has mild central abdominal tenderness, ND, bowel sounds + Extremities: no edema, no cyanosis Neuro: alert, knows her own name, not oriented to time and place. Moves all extremities    The results of significant diagnostics from this hospitalization (including imaging, microbiology, ancillary and laboratory) are listed below for reference.     Microbiology: Recent Results (from the past 240 hour(s))  SARS CORONAVIRUS 2 (TAT 6-24 HRS) Nasopharyngeal Nasopharyngeal Swab     Status: Abnormal   Collection Time: 03/07/19  6:32 PM   Specimen: Nasopharyngeal Swab  Result Value Ref Range Status   SARS Coronavirus 2 POSITIVE (A) NEGATIVE Final    Comment: RESULT CALLED TO, READ BACK BY AND VERIFIED  WITH: WOODS B, RN AT 0348 ON 03/08/2019 BY SAINVILUS S (NOTE) SARS-CoV-2 target nucleic acids are DETECTED. The SARS-CoV-2 RNA is generally detectable in upper and lower respiratory specimens during the acute phase of infection. Positive results are indicative of active infection with SARS-CoV-2. Clinical  correlation with patient history and other diagnostic information is necessary to determine patient infection status. Positive results do  not rule out bacterial infection or co-infection with other viruses. The expected result is Negative. Fact Sheet for Patients: HairSlick.nohttps://www.fda.gov/media/138098/download Fact Sheet for Healthcare Providers: quierodirigir.comhttps://www.fda.gov/media/138095/download This test is not yet approved or cleared by the [place] FDA and  has been authorized for detection and/or diagnosis of SARS-CoV-2 by FDA under an Emergency Use Authorization (EUA). This EUA will remain  in effect (meaning this test can b e used) for the duration of the COVID-19 declaration under Section 564(b)(1) of the Act, 21 U.S.C. section 360bbb-3(b)(1), unless the authorization is terminated or revoked sooner. Performed at [facility], [address] [place], [place] [id number]      Labs: BNP (last 3 results) No results for input(s): BNP in the last 8760 hours. Basic Metabolic Panel: Recent Labs  Lab 03/05/19 1554 03/07/19 1526 03/08/19 0553  NA 136 135 138  K 4.1 3.3* 3.9  CL 98 95* 100  CO2 24 27 26  GLUCOSE 96 109* 95  BUN 24* 18 19  CREATININE 1.02* 0.79 0.82  CALCIUM 9.2 9.0 8.6*  MG  --  1.8  --    Liver Function Tests: Recent Labs  Lab 03/05/19 1554 03/07/19 1526  AST 38 32  ALT 23 21  ALKPHOS 60 61  BILITOT 1.0 0.8  PROT 7.4 7.2  ALBUMIN 3.7 3.2*   Recent Labs  Lab 03/05/19 1554 03/07/19 1526  LIPASE 65* 84*   No results for input(s): AMMONIA in the last 168 hours. CBC: Recent Labs  Lab 03/05/19 1554 03/07/19 1526 03/08/19 0553  WBC 6.7  7.6 5.5  NEUTROABS 5.4 6.3  --   HGB 15.0 14.9 14.4  HCT 46.7* 44.4 43.9  MCV 90.2 86.0 88.9  PLT 167 202 181   Cardiac Enzymes: No results for input(s): CKTOTAL, CKMB, CKMBINDEX, TROPONINI in the last 168 hours. BNP: Invalid input(s): POCBNP CBG: No results for input(s): GLUCAP in the last 168 hours. D-Dimer No results for input(s): DDIMER in the last 72 hours. Hgb A1c No results for input(s): HGBA1C in the last 72 hours. Lipid Profile No results for input(s): CHOL, HDL, LDLCALC, TRIG, CHOLHDL, LDLDIRECT in the last 72 hours. Thyroid function studies No results for input(s): TSH, T4TOTAL, T3FREE, THYROIDAB in the last 72 hours.  Invalid input(s): FREET3 Anemia work up No results for input(s): VITAMINB12, FOLATE, FERRITIN, TIBC, IRON, RETICCTPCT in the last 72 hours. Urinalysis    Component Value Date/Time   COLORURINE YELLOW (A) 03/07/2019 1526   APPEARANCEUR CLOUDY (A) 03/07/2019 1526   LABSPEC 1.019 03/07/2019 1526   PHURINE 6.0 03/07/2019 1526   GLUCOSEU NEGATIVE 03/07/2019 1526   HGBUR NEGATIVE 03/07/2019 1526   BILIRUBINUR NEGATIVE 03/07/2019 1526   KETONESUR 20 (A) 03/07/2019 1526   PROTEINUR 30 (A) 03/07/2019 1526   NITRITE NEGATIVE 03/07/2019 1526   LEUKOCYTESUR TRACE (A) 03/07/2019 1526   Sepsis Labs Invalid input(s): PROCALCITONIN,  WBC,  LACTICIDVEN Microbiology Recent Results (from the past 240 hour(s))  SARS CORONAVIRUS 2 (TAT 6-24 HRS) Nasopharyngeal Nasopharyngeal Swab     Status: Abnormal   Collection Time: 03/07/19  6:32 PM   Specimen: Nasopharyngeal Swab  Result Value Ref Range Status   SARS Coronavirus 2 POSITIVE (A) NEGATIVE Final    Comment: RESULT CALLED TO, READ BACK BY AND VERIFIED WITH: WOODS B, RN AT 0348 ON 03/08/2019 BY SAINVILUS S (NOTE) SARS-CoV-2 target nucleic acids are DETECTED. The SARS-CoV-2 RNA is generally detectable in upper and lower respiratory specimens during the acute phase of infection. Positive results are indicative  of active infection with SARS-CoV-2. Clinical  correlation with patient history and other diagnostic information is necessary to determine patient infection status. Positive results do  not rule out bacterial infection or co-infection with other viruses. The expected result is Negative. Fact Sheet for Patients: HairSlick.no Fact Sheet for Healthcare Providers: quierodirigir.com This test is not yet approved or cleared by the [place] FDA and  has been authorized for detection and/or diagnosis of SARS-CoV-2 by FDA under an Emergency Use Authorization (EUA). This EUA will remain  in effect (meaning this test can b e used) for the duration of the COVID-19 declaration under Section 564(b)(1) of the Act, 21 U.S.C. section 360bbb-3(b)(1), unless the authorization is terminated or revoked sooner. Performed at [facility], [address] [place], [place] [address]     Time coordinating discharge:  35 minutes.  SIGNED:  Lorretta Harp, DO [facility] 03/08/2019, 12:01 PM Pager is on AMION  If 7PM-7AM, please contact night-coverage [email or web address] Password [email or web address]

## 2019-03-08 NOTE — ED Notes (Signed)
Pt unable to sign consent, not oriented.

## 2019-03-08 NOTE — Progress Notes (Signed)
Initial Nutrition Assessment  DOCUMENTATION CODES:   Not applicable  INTERVENTION:   Recommend Ensure Enlive po TID, each supplement provides 350 kcal and 20 grams of protein  Magic cup TID with meals, each supplement provides 290 kcal and 9 grams of protein  Recommend MVI daily  NUTRITION DIAGNOSIS:   Increased nutrient needs related to acute illness(COVID 19) as evidenced by increased estimated needs.  GOAL:   Patient will meet greater than or equal to 90% of their needs  MONITOR:   PO intake, Supplement acceptance, Labs, Weight trends, Skin, I & O's  REASON FOR ASSESSMENT:   Consult Assessment of nutrition requirement/status  ASSESSMENT:   85 y.o. female with medical history significant of bilateral hearing loss, age-related macular degeneration, hyperlipidemia, chronic diarrhea who was sent to ED by her assisted living facility for concerns of fever, weakness and increased confusion. Pt found to have COVID 19 infection  Unable to see patient as pt with COVID 19 and pt remains in the ED awaiting transfer to [facility] hospital. Pt with AMS; pt has been unable to provide any information to providers. Per chart review, pt did not order any lunch today. RD suspects pt with poor appetite and oral intake pta. Recommend supplements and MVI to help pt meet her estimated needs. Per chart, pt appears fairly weight stable pta but pt's last documented weight was from January, 105lbs.   Pt is at high risk for malnutrition but unable to diagnose at this time as NFPE cannot be performed.    Medications reviewed and include: lovenox, mirtazapine, vitamin C, zinc, ceftriaxone   Labs reviewed:   Unable to complete Nutrition-Focused physical exam at this time as pt with COVID 19.   Diet Order:   Diet Order            Diet regular Room service appropriate? Yes; Fluid consistency: Thin  Diet effective now             EDUCATION NEEDS:   Not appropriate for education at this  time  Skin:   Unable to assess   Last BM:  11/20- type 5  Height:   Ht Readings from Last 1 Encounters:  03/07/19 4\' 10"  (1.473 m)    Weight:   Wt Readings from Last 1 Encounters:  03/07/19 47 kg    Ideal Body Weight:  44 kg  BMI:  Body mass index is 21.66 kg/m.  Estimated Nutritional Needs:   Kcal:  1200-1400kcal/day  Protein:  60-70g/day  Fluid:  >1.1L/day  Koleen Distance MS, RD, LDN Pager #- [phone] Office#- [phone] After Hours Pager: [phone]

## 2019-03-09 DIAGNOSIS — G9341 Metabolic encephalopathy: Secondary | ICD-10-CM | POA: Diagnosis present

## 2019-03-09 DIAGNOSIS — J939 Pneumothorax, unspecified: Secondary | ICD-10-CM | POA: Diagnosis present

## 2019-03-09 DIAGNOSIS — U071 COVID-19: Secondary | ICD-10-CM

## 2019-03-09 DIAGNOSIS — R4182 Altered mental status, unspecified: Secondary | ICD-10-CM | POA: Diagnosis present

## 2019-03-09 DIAGNOSIS — Z6824 Body mass index (BMI) 24.0-24.9, adult: Secondary | ICD-10-CM | POA: Diagnosis not present

## 2019-03-09 DIAGNOSIS — E785 Hyperlipidemia, unspecified: Secondary | ICD-10-CM | POA: Diagnosis present

## 2019-03-09 DIAGNOSIS — B961 Klebsiella pneumoniae [K. pneumoniae] as the cause of diseases classified elsewhere: Secondary | ICD-10-CM | POA: Diagnosis present

## 2019-03-09 DIAGNOSIS — H9193 Unspecified hearing loss, bilateral: Secondary | ICD-10-CM | POA: Diagnosis present

## 2019-03-09 DIAGNOSIS — N390 Urinary tract infection, site not specified: Secondary | ICD-10-CM | POA: Diagnosis present

## 2019-03-09 DIAGNOSIS — E876 Hypokalemia: Secondary | ICD-10-CM | POA: Diagnosis present

## 2019-03-09 DIAGNOSIS — H3530 Unspecified macular degeneration: Secondary | ICD-10-CM | POA: Diagnosis present

## 2019-03-09 DIAGNOSIS — E440 Moderate protein-calorie malnutrition: Secondary | ICD-10-CM | POA: Diagnosis present

## 2019-03-09 DIAGNOSIS — K529 Noninfective gastroenteritis and colitis, unspecified: Secondary | ICD-10-CM | POA: Diagnosis present

## 2019-03-09 DIAGNOSIS — Z23 Encounter for immunization: Secondary | ICD-10-CM | POA: Diagnosis present

## 2019-03-09 DIAGNOSIS — F0390 Unspecified dementia without behavioral disturbance: Secondary | ICD-10-CM | POA: Diagnosis present

## 2019-03-09 DIAGNOSIS — J1289 Other viral pneumonia: Secondary | ICD-10-CM | POA: Diagnosis present

## 2019-03-09 DIAGNOSIS — Z515 Encounter for palliative care: Secondary | ICD-10-CM | POA: Diagnosis not present

## 2019-03-09 DIAGNOSIS — Z66 Do not resuscitate: Secondary | ICD-10-CM | POA: Diagnosis present

## 2019-03-09 DIAGNOSIS — Z96641 Presence of right artificial hip joint: Secondary | ICD-10-CM | POA: Diagnosis present

## 2019-03-09 DIAGNOSIS — R627 Adult failure to thrive: Secondary | ICD-10-CM | POA: Diagnosis present

## 2019-03-09 DIAGNOSIS — Z9049 Acquired absence of other specified parts of digestive tract: Secondary | ICD-10-CM | POA: Diagnosis not present

## 2019-03-09 LAB — CBC WITH DIFFERENTIAL/PLATELET
Abs Immature Granulocytes: 0.06 10*3/uL (ref 0.00–0.07)
Basophils Absolute: 0 10*3/uL (ref 0.0–0.1)
Basophils Relative: 0 %
Eosinophils Absolute: 0 10*3/uL (ref 0.0–0.5)
Eosinophils Relative: 0 %
HCT: 43.1 % (ref 36.0–46.0)
Hemoglobin: 14 g/dL (ref 12.0–15.0)
Immature Granulocytes: 1 %
Lymphocytes Relative: 11 %
Lymphs Abs: 0.5 10*3/uL — ABNORMAL LOW (ref 0.7–4.0)
MCH: 29.3 pg (ref 26.0–34.0)
MCHC: 32.5 g/dL (ref 30.0–36.0)
MCV: 90.2 fL (ref 80.0–100.0)
Monocytes Absolute: 0.7 10*3/uL (ref 0.1–1.0)
Monocytes Relative: 14 %
Neutro Abs: 3.4 10*3/uL (ref 1.7–7.7)
Neutrophils Relative %: 74 %
Platelets: 202 10*3/uL (ref 150–400)
RBC: 4.78 MIL/uL (ref 3.87–5.11)
RDW: 12.6 % (ref 11.5–15.5)
WBC: 4.7 10*3/uL (ref 4.0–10.5)
nRBC: 0 % (ref 0.0–0.2)

## 2019-03-09 LAB — C-REACTIVE PROTEIN: CRP: 8.3 mg/dL — ABNORMAL HIGH (ref <1.0)

## 2019-03-09 LAB — COMPREHENSIVE METABOLIC PANEL
ALT: 20 U/L (ref 0–44)
AST: 31 U/L (ref 15–41)
Albumin: 2.7 g/dL — ABNORMAL LOW (ref 3.5–5.0)
Alkaline Phosphatase: 66 U/L (ref 38–126)
Anion gap: 11 (ref 5–15)
BUN: 19 mg/dL (ref 8–23)
CO2: 26 mmol/L (ref 22–32)
Calcium: 8.5 mg/dL — ABNORMAL LOW (ref 8.9–10.3)
Chloride: 101 mmol/L (ref 98–111)
Creatinine, Ser: 0.7 mg/dL (ref 0.44–1.00)
GFR calc Af Amer: >60 mL/min (ref >60)
GFR calc non Af Amer: >60 mL/min (ref >60)
Glucose, Bld: 97 mg/dL (ref 70–99)
Potassium: 3.8 mmol/L (ref 3.5–5.1)
Sodium: 138 mmol/L (ref 135–145)
Total Bilirubin: 0.7 mg/dL (ref 0.3–1.2)
Total Protein: 6 g/dL — ABNORMAL LOW (ref 6.5–8.1)

## 2019-03-09 LAB — URINE CULTURE: Culture: >=100000 — AB

## 2019-03-09 LAB — D-DIMER, QUANTITATIVE: D-Dimer, Quant: 0.71 ug/mL-FEU — ABNORMAL HIGH (ref 0.00–0.50)

## 2019-03-09 LAB — ABO/RH: ABO/RH(D): A POS

## 2019-03-09 MED ORDER — PNEUMOCOCCAL VAC POLYVALENT 25 MCG/0.5ML IJ INJ
0.5000 mL | INJECTION | INTRAMUSCULAR | Status: AC
  Administered 2019-03-11: 0.5 mL via INTRAMUSCULAR
  Filled 2019-03-09: qty 0.5

## 2019-03-09 MED ORDER — ENSURE ENLIVE PO LIQD
237.0000 mL | Freq: Three times a day (TID) | ORAL | Status: DC
  Administered 2019-03-09 – 2019-03-15 (×14): 237 mL via ORAL
  Filled 2019-03-09 (×7): qty 237

## 2019-03-09 MED ORDER — SODIUM CHLORIDE 0.9 % IV BOLUS
500.0000 mL | Freq: Once | INTRAVENOUS | Status: AC
  Administered 2019-03-09: 500 mL via INTRAVENOUS

## 2019-03-09 MED ORDER — SODIUM CHLORIDE 0.9 % IV SOLN
INTRAVENOUS | Status: AC
  Administered 2019-03-09: 15:00:00 via INTRAVENOUS

## 2019-03-09 MED ORDER — ROSUVASTATIN CALCIUM 5 MG PO TABS
10.0000 mg | ORAL_TABLET | Freq: Every day | ORAL | Status: DC
  Administered 2019-03-09 – 2019-03-14 (×6): 10 mg via ORAL
  Filled 2019-03-09 (×6): qty 2

## 2019-03-09 MED ORDER — INFLUENZA VAC A&B SA ADJ QUAD 0.5 ML IM PRSY
0.5000 mL | PREFILLED_SYRINGE | INTRAMUSCULAR | Status: AC
  Administered 2019-03-11: 12:00:00 0.5 mL via INTRAMUSCULAR
  Filled 2019-03-09: qty 0.5

## 2019-03-09 MED ORDER — ENOXAPARIN SODIUM 30 MG/0.3ML ~~LOC~~ SOLN
30.0000 mg | SUBCUTANEOUS | Status: DC
  Administered 2019-03-09: 30 mg via SUBCUTANEOUS
  Filled 2019-03-09: qty 0.3

## 2019-03-09 MED ORDER — HEPARIN SODIUM (PORCINE) 5000 UNIT/ML IJ SOLN
5000.0000 [IU] | Freq: Three times a day (TID) | INTRAMUSCULAR | Status: DC
  Administered 2019-03-10 – 2019-03-15 (×16): 5000 [IU] via SUBCUTANEOUS
  Filled 2019-03-09 (×15): qty 1

## 2019-03-09 MED ORDER — ORAL CARE MOUTH RINSE
15.0000 mL | Freq: Two times a day (BID) | OROMUCOSAL | Status: DC
  Administered 2019-03-09 – 2019-03-19 (×22): 15 mL via OROMUCOSAL

## 2019-03-09 MED ORDER — MIRTAZAPINE 15 MG PO TABS
7.5000 mg | ORAL_TABLET | Freq: Every day | ORAL | Status: DC
  Administered 2019-03-09 – 2019-03-19 (×11): 7.5 mg via ORAL
  Filled 2019-03-09 (×11): qty 1

## 2019-03-09 NOTE — Progress Notes (Addendum)
PROGRESS NOTE                                                                                                                                                                                                             Patient Demographics:    Yvonne Edwards, is a 85 y.o. female, DOB - 1919-03-16, EFE:[id number]  Admit date - 03/08/2019   Admitting Physician Lorretta Harp, MD  Outpatient Primary MD for the patient is [name], MD  LOS - 1   No chief complaint on file.      Brief Narrative    85 y.o.femalewith medical history significant ofbilateral hearing loss,age-related macular degeneration, hyperlipidemia, chronic diarrheawho was sent to ED byher assisted living facility for concerns of fever, weakness and increased confusion, her work-up was significant for UTI, as well she did test positive for COVID-19 pneumonia, so she was transferred to [facility] for further management.    Subjective:    [name] today herself cannot provide any complaints, no significant events as discussed with staff .    Assessment  & Plan :    Principal Problem:   Acute lower UTI Active Problems:   AMD (age related macular degeneration)   Acute metabolic encephalopathy   Other pneumothorax   COVID-19 virus infection   Chronic diarrhea   UTI (urinary tract infection)   Dementia (HCC)   COVID-19 virus infection -Covid 19+ on admission to [facility]. -So far appears to be symptomatic with no oxygen requirement.  No active cough or respiratory distress. CXR with no infiltration for pneumonia.  -vitamin C, zinc.  -Continue to monitor inflammatory markers  COVID-19 Labs  Recent Labs    03/08/19 1359 03/09/19 0210  DDIMER  --  0.71*  FERRITIN 791*  --   LDH 170  --   CRP 10.9* 8.3*    Lab Results  Component Value Date   SARSCOV2NAA POSITIVE (A) 03/07/2019     Altered mental status:  -Likely due to UTI, CT head with no acute findings. -Shunt  with very poor oral intake, and fluid intake, to be clinically dehydrated with delayed skin turgor, will start on IV fluids at 50 cc/h.  UTI -Culture growing Klebsiella, continue with IV Rocephin.  Right 5 mm apical pneumothorax: -  Incidental finding on CT spine. Patient is not hypoxic and not tachypneic on  exam. The repeated CXR is negative for pneumothorax  Hypokalemia:  -Repleted  Chronic diarrhea and mild AP:  - pt is on immodium for this. CT of abdomen is unremarkable. -Checked C.diff on admission, pending results. -Hold Imodium until C diff negative -Hold laxatives   Hyperlipidemia -Continue rosuvastatin  Dementia -Continue mirtazapine  Protein calorie malnutrition: Moderate.  -consulted dietitian.  Code Status : DNR  Family Communication  : Well update family  Disposition Plan  : Back to SNF  Barriers For Discharge : Extremely frail, at high risk of rapid deterioration secondary to COVID 19, will need close monitoring, as well mentation not back at baseline, and remains with poor oral intake  Consults  :  none  Procedures  : None  DVT Prophylaxis  :  [place] lovenox  Lab Results  Component Value Date   PLT 202 03/09/2019    Antibiotics  :    Anti-infectives (From admission, onward)   Start     Dose/Rate Route Frequency Ordered Stop   03/08/19 2200  cefTRIAXone (ROCEPHIN) 1 g in sodium chloride 0.9 % 100 mL IVPB     1 g 200 mL/hr over 30 Minutes Intravenous Every 24 hours 03/08/19 1925          Objective:   Vitals:   03/08/19 2334 03/09/19 0400 03/09/19 0550 03/09/19 0800  BP: (!) 164/76 138/70  (!) 147/75  Pulse: 90 83  88  Resp: (!) 22 (!) 22  20  Temp: 97.9 F (36.6 C) 97.9 F (36.6 C)  98.2 F (36.8 C)  TempSrc: Oral Axillary  Axillary  SpO2:  96%  95%  Weight:   54.4 kg   Height: 4' 9.99" (1.473 m)       Wt Readings from Last 3 Encounters:  03/09/19 54.4 kg  03/07/19 47 kg  03/05/19 47 kg     Intake/Output Summary (Last 24  hours) at 03/09/2019 1215 Last data filed at 03/09/2019 0309 Gross per 24 hour  Intake 443 ml  Output 75 ml  Net 368 ml     Physical Exam  Patient is awake, alert x1 easily distracted, unable to answer questions appropriately, 3 million frail  Symmetrical Chest wall movement, diminished air entry at the bases, but no wheezing or rhonchi RRR,No Gallops,Rubs or new Murmurs, No Parasternal Heave +ve B.Sounds, Abd Soft,  No rebound - guarding or rigidity. No Cyanosis, Clubbing or edema, No new Rash or bruise      Data Review:    CBC Recent Labs  Lab 03/05/19 1554 03/07/19 1526 03/08/19 0553 03/09/19 0210  WBC 6.7 7.6 5.5 4.7  HGB 15.0 14.9 14.4 14.0  HCT 46.7* 44.4 43.9 43.1  PLT 167 202 181 202  MCV 90.2 86.0 88.9 90.2  MCH 29.0 28.9 29.1 29.3  MCHC 32.1 33.6 32.8 32.5  RDW 12.8 12.7 12.6 12.6  LYMPHSABS 0.7 0.6*  --  0.5*  MONOABS 0.6 0.7  --  0.7  EOSABS 0.0 0.0  --  0.0  BASOSABS 0.0 0.0  --  0.0    Chemistries  Recent Labs  Lab 03/05/19 1554 03/07/19 1526 03/08/19 0553 03/09/19 0210  NA 136 135 138 138  K 4.1 3.3* 3.9 3.8  CL 98 95* 100 101  CO2 24 27 26 26   GLUCOSE 96 109* 95 97  BUN 24* 18 19 19   CREATININE 1.02* 0.79 0.82 0.70  CALCIUM 9.2 9.0 8.6* 8.5*  MG  --  1.8  --   --   AST  38 32  --  31  ALT 23 21  --  20  ALKPHOS 60 61  --  66  BILITOT 1.0 0.8  --  0.7   ------------------------------------------------------------------------------------------------------------------ No results for input(s): CHOL, HDL, LDLCALC, TRIG, CHOLHDL, LDLDIRECT in the last 72 hours.  Lab Results  Component Value Date   HGBA1C 5.9 (H) 12/05/2016   ------------------------------------------------------------------------------------------------------------------ No results for input(s): TSH, T4TOTAL, T3FREE, THYROIDAB in the last 72 hours.  Invalid input(s): FREET3  ------------------------------------------------------------------------------------------------------------------ Recent Labs    03/08/19 1359  FERRITIN 791*    Coagulation profile No results for input(s): INR, PROTIME in the last 168 hours.  Recent Labs    03/09/19 0210  DDIMER 0.71*    Cardiac Enzymes No results for input(s): CKMB, TROPONINI, MYOGLOBIN in the last 168 hours.  Invalid input(s): CK ------------------------------------------------------------------------------------------------------------------    Component Value Date/Time   BNP 134.0 (H) 03/08/2019 0553    Inpatient Medications  Scheduled Meds: . influenza vaccine adjuvanted  0.5 mL Intramuscular Tomorrow-1000  . mouth rinse  15 mL Mouth Rinse BID  . pneumococcal 23 valent vaccine  0.5 mL Intramuscular Tomorrow-1000  . sodium chloride flush  3 mL Intravenous Q12H  . vitamin C  500 mg Oral Daily  . zinc sulfate  220 mg Oral Daily   Continuous Infusions: . sodium chloride    . cefTRIAXone (ROCEPHIN)  IV Stopped (03/08/19 2358)   PRN Meds:.sodium chloride, acetaminophen, guaiFENesin-dextromethorphan, ondansetron **OR** ondansetron (ZOFRAN) IV, sodium chloride flush  Micro Results Recent Results (from the past 240 hour(s))  Urine culture     Status: Abnormal   Collection Time: 03/07/19  3:26 PM   Specimen: Urine, Catheterized  Result Value Ref Range Status   Specimen Description   Final    URINE, CATHETERIZED Performed at [facility], [address] [place], [place] [address]    Special Requests   Final    NONE Performed at [facility], [address] [place], [place] [address]    Culture >=100,000 COLONIES/mL KLEBSIELLA PNEUMONIAE (A)  Final   Report Status 03/09/2019 FINAL  Final   Organism ID, Bacteria KLEBSIELLA PNEUMONIAE (A)  Final      Susceptibility   Klebsiella pneumoniae - MIC*    AMPICILLIN >=32 RESISTANT Resistant     CEFAZOLIN <=4 SENSITIVE Sensitive      CEFTRIAXONE <=1 SENSITIVE Sensitive     CIPROFLOXACIN <=0.25 SENSITIVE Sensitive     GENTAMICIN <=1 SENSITIVE Sensitive     IMIPENEM <=0.25 SENSITIVE Sensitive     NITROFURANTOIN 64 INTERMEDIATE Intermediate     TRIMETH/SULFA <=20 SENSITIVE Sensitive     AMPICILLIN/SULBACTAM 4 SENSITIVE Sensitive     PIP/TAZO 8 SENSITIVE Sensitive     Extended ESBL NEGATIVE Sensitive     * >=100,000 COLONIES/mL KLEBSIELLA PNEUMONIAE  SARS CORONAVIRUS 2 (TAT 6-24 HRS) Nasopharyngeal Nasopharyngeal Swab     Status: Abnormal   Collection Time: 03/07/19  6:32 PM   Specimen: Nasopharyngeal Swab  Result Value Ref Range Status   SARS Coronavirus 2 POSITIVE (A) NEGATIVE Final    Comment: RESULT CALLED TO, READ BACK BY AND VERIFIED WITH: WOODS B, RN AT 0348 ON 03/08/2019 BY SAINVILUS S (NOTE) SARS-CoV-2 target nucleic acids are DETECTED. The SARS-CoV-2 RNA is generally detectable in upper and lower respiratory specimens during the acute phase of infection. Positive results are indicative of active infection with SARS-CoV-2. Clinical  correlation with patient history and other diagnostic information is necessary to determine patient infection status. Positive results do  not rule out bacterial infection or co-infection with other viruses. The expected result is Negative. Fact Sheet for Patients: HairSlick.no Fact Sheet for Healthcare Providers: quierodirigir.com This test is not yet approved or cleared by the [place] FDA and  has been authorized for detection and/or diagnosis of SARS-CoV-2 by FDA under an Emergency Use Authorization (EUA). This EUA will remain  in effect (meaning this test can b e used) for the duration of the COVID-19 declaration under Section 564(b)(1) of the Act, 21 U.S.C. section 360bbb-3(b)(1), unless the authorization is terminated or revoked sooner. Performed at [facility], [address], [place], [place]  [address]     Radiology Reports Ct Head Wo Contrast  Result Date: 03/07/2019 CLINICAL DATA:  84 year old female with altered mental status. EXAM: CT HEAD WITHOUT CONTRAST CT CERVICAL SPINE WITHOUT CONTRAST TECHNIQUE: Multidetector CT imaging of the head and cervical spine was performed following the standard protocol without intravenous contrast. Multiplanar CT image reconstructions of the cervical spine were also generated. COMPARISON:  None. FINDINGS: CT HEAD FINDINGS Brain: Moderate to advanced age-related atrophy and chronic microvascular ischemic changes. There is no acute intracranial hemorrhage. No mass effect or midline shift. No extra-axial fluid collection. Vascular: No hyperdense vessel or unexpected calcification. Skull: Normal. Negative for fracture or focal lesion. Sinuses/Orbits: No acute finding. Other: None CT CERVICAL SPINE FINDINGS Alignment: No acute subluxation. Grade 1 C3-C4 anterolisthesis. Skull base and vertebrae: No acute fracture. Osteopenia. Soft tissues and spinal canal: No prevertebral fluid or swelling. No visible canal hematoma. Disc levels: Multilevel degenerative changes with endplate irregularity and disc space narrowing. Upper chest: Biapical subpleural scarring. There is a 5 mm right apical pneumothorax. This is new compared to the CT of 12/09/2016. Other: Bilateral carotid bulb calcified plaques. IMPRESSION: 1. No acute intracranial hemorrhage. Moderate to advanced age-related atrophy and chronic microvascular ischemic changes. 2. No acute/traumatic cervical spine pathology. Multilevel degenerative changes. 3. A 5 mm right apical pneumothorax. These results were called by telephone at the time of interpretation on 03/07/2019 at 4:29 pm to provider [facility] , who verbally acknowledged these results. Electronically Signed   By: Elgie Collard M.D.   On: 03/07/2019 16:30   Ct Cervical Spine Wo Contrast  Result Date: 03/07/2019 CLINICAL DATA:  85 year old female with  altered mental status. EXAM: CT HEAD WITHOUT CONTRAST CT CERVICAL SPINE WITHOUT CONTRAST TECHNIQUE: Multidetector CT imaging of the head and cervical spine was performed following the standard protocol without intravenous contrast. Multiplanar CT image reconstructions of the cervical spine were also generated. COMPARISON:  None. FINDINGS: CT HEAD FINDINGS Brain: Moderate to advanced age-related atrophy and chronic microvascular ischemic changes. There is no acute intracranial hemorrhage. No mass effect or midline shift. No extra-axial fluid collection. Vascular: No hyperdense vessel or unexpected calcification. Skull: Normal. Negative for fracture or focal lesion. Sinuses/Orbits: No acute finding. Other: None CT CERVICAL SPINE FINDINGS Alignment: No acute subluxation. Grade 1 C3-C4 anterolisthesis. Skull base and vertebrae: No acute fracture. Osteopenia. Soft tissues and spinal canal: No prevertebral fluid or swelling. No visible canal hematoma. Disc levels: Multilevel degenerative changes with endplate irregularity and disc space narrowing. Upper chest: Biapical subpleural scarring. There is a 5 mm right apical pneumothorax. This is new compared to the CT of 12/09/2016. Other: Bilateral carotid bulb calcified plaques. IMPRESSION: 1. No acute intracranial hemorrhage. Moderate to advanced age-related atrophy and chronic microvascular ischemic changes. 2. No acute/traumatic cervical spine pathology. Multilevel degenerative changes. 3. A 5 mm right apical pneumothorax. These results were  called by telephone at the time of interpretation on 03/07/2019 at 4:29 pm to provider [facility] , who verbally acknowledged these results. Electronically Signed   By: Elgie Collard M.D.   On: 03/07/2019 16:30   Ct Abdomen Pelvis W Contrast  Result Date: 03/05/2019 CLINICAL DATA:  Upper abdominal pain, diarrhea, decreased appetite EXAM: CT ABDOMEN AND PELVIS WITH CONTRAST TECHNIQUE: Multidetector CT imaging of the abdomen and  pelvis was performed using the standard protocol following bolus administration of intravenous contrast. CONTRAST:  75mL OMNIPAQUE IOHEXOL 300 MG/ML  SOLN COMPARISON:  12/20/2012 FINDINGS: Technical note: Examination is degraded by patient motion artifact. Additional metallic streak/beam hardening artifact from right total hip arthroplasty hardware degrades evaluation of the deep pelvis. Lower chest: Trace right pleural effusion with right basilar atelectasis and small dependent areas of airspace consolidation. Left lung base clear. Coronary artery calcifications. Hepatobiliary: No focal liver abnormality is seen. Status post cholecystectomy. No biliary dilatation. Pancreas: Grossly unremarkable. Spleen: Normal in size without focal abnormality. Adrenals/Urinary Tract: Adrenal glands and bilateral kidneys appear grossly unremarkable. Urinary bladder is partially obscured by artifact. No discrete abnormality. Stomach/Bowel: Negative for bowel obstruction. Small volume liquid stool within bowel suggesting diarrheal state. No definite bowel thickening or inflammatory changes are evident within the limitations of this exam. Vascular/Lymphatic: Aortic atherosclerosis. No enlarged abdominal or pelvic lymph nodes. Reproductive: No appreciable abnormality. No adnexal mass. Other: No ascites. No abdominal wall hernia. Musculoskeletal: Partially visualized right total hip arthroplasty hardware. Degenerative disc disease of the lumbar spine. There is a dextroscoliotic curvature of the lumbar spine. No acute osseous findings are evident. IMPRESSION: 1. Somewhat limited exam.  No acute abdominopelvic findings. 2. Trace right pleural effusion with right basilar atelectasis and small dependent areas of airspace consolidation. 3. Status post cholecystectomy. 4. Aortic atherosclerosis and coronary artery calcifications. Electronically Signed   By: Duanne Guess M.D.   On: 03/05/2019 17:17   Dg Chest Port 1 View  Result Date:  03/08/2019 CLINICAL DATA:  COVID positive, evaluate for pneumothorax EXAM: PORTABLE CHEST 1 VIEW COMPARISON:  2018 FINDINGS: Chronic mild interstitial prominence. No pleural effusion or pneumothorax. Stable cardiomediastinal contours with normal heart size. Calcified plaque along the thoracic aorta. IMPRESSION: No pneumothorax. Electronically Signed   By: Guadlupe Spanish M.D.   On: 03/08/2019 08:35   Dg Chest Portable 1 View  Result Date: 03/07/2019 CLINICAL DATA:  Fever and weakness. EXAM: PORTABLE CHEST 1 VIEW COMPARISON:  Chest x-ray dated December 09, 2016. FINDINGS: The heart size and mediastinal contours are within normal limits. Atherosclerotic calcification of the thoracic aorta. Normal pulmonary vascularity. No focal consolidation, pleural effusion, or pneumothorax. No acute osseous abnormality. IMPRESSION: No active disease. Electronically Signed   By: Obie Dredge M.D.   On: 03/07/2019 15:59      Huey Bienenstock M.D on 03/09/2019 at 12:15 PM  Between 7am to 7pm - Pager - [phone]  After 7pm go to [email or web address] - password [email or web address]  [facility] -  Office  [phone]

## 2019-03-10 LAB — COMPREHENSIVE METABOLIC PANEL
ALT: 19 U/L (ref 0–44)
AST: 27 U/L (ref 15–41)
Albumin: 2.4 g/dL — ABNORMAL LOW (ref 3.5–5.0)
Alkaline Phosphatase: 61 U/L (ref 38–126)
Anion gap: 9 (ref 5–15)
BUN: 21 mg/dL (ref 8–23)
CO2: 26 mmol/L (ref 22–32)
Calcium: 8.1 mg/dL — ABNORMAL LOW (ref 8.9–10.3)
Chloride: 106 mmol/L (ref 98–111)
Creatinine, Ser: 0.75 mg/dL (ref 0.44–1.00)
GFR calc Af Amer: >60 mL/min (ref >60)
GFR calc non Af Amer: >60 mL/min (ref >60)
Glucose, Bld: 98 mg/dL (ref 70–99)
Potassium: 3.3 mmol/L — ABNORMAL LOW (ref 3.5–5.1)
Sodium: 141 mmol/L (ref 135–145)
Total Bilirubin: 0.3 mg/dL (ref 0.3–1.2)
Total Protein: 5.5 g/dL — ABNORMAL LOW (ref 6.5–8.1)

## 2019-03-10 LAB — CBC WITH DIFFERENTIAL/PLATELET
Abs Immature Granulocytes: 0.01 10*3/uL (ref 0.00–0.07)
Basophils Absolute: 0 10*3/uL (ref 0.0–0.1)
Basophils Relative: 1 %
Eosinophils Absolute: 0 10*3/uL (ref 0.0–0.5)
Eosinophils Relative: 1 %
HCT: 40.8 % (ref 36.0–46.0)
Hemoglobin: 13.1 g/dL (ref 12.0–15.0)
Immature Granulocytes: 0 %
Lymphocytes Relative: 16 %
Lymphs Abs: 0.7 10*3/uL (ref 0.7–4.0)
MCH: 29.3 pg (ref 26.0–34.0)
MCHC: 32.1 g/dL (ref 30.0–36.0)
MCV: 91.3 fL (ref 80.0–100.0)
Monocytes Absolute: 0.6 10*3/uL (ref 0.1–1.0)
Monocytes Relative: 15 %
Neutro Abs: 2.8 10*3/uL (ref 1.7–7.7)
Neutrophils Relative %: 67 %
Platelets: 212 10*3/uL (ref 150–400)
RBC: 4.47 MIL/uL (ref 3.87–5.11)
RDW: 12.9 % (ref 11.5–15.5)
WBC: 4.1 10*3/uL (ref 4.0–10.5)
nRBC: 0 % (ref 0.0–0.2)

## 2019-03-10 LAB — D-DIMER, QUANTITATIVE: D-Dimer, Quant: 0.88 ug/mL-FEU — ABNORMAL HIGH (ref 0.00–0.50)

## 2019-03-10 LAB — C-REACTIVE PROTEIN: CRP: 5 mg/dL — ABNORMAL HIGH (ref <1.0)

## 2019-03-10 MED ORDER — POTASSIUM CHLORIDE 20 MEQ/15ML (10%) PO SOLN
40.0000 meq | Freq: Once | ORAL | Status: AC
  Administered 2019-03-10: 40 meq via ORAL
  Filled 2019-03-10: qty 30

## 2019-03-10 NOTE — Progress Notes (Signed)
PROGRESS NOTE                                                                                                                                                                                                             Patient Demographics:    Yvonne Edwards, is a 85 y.o. female, DOB - Jun 10, 1918, HYQ:[id number]  Admit date - 03/08/2019   Admitting Physician Lorretta Harp, MD  Outpatient Primary MD for the patient is Yvonne Reichmann, MD  LOS - 2   No chief complaint on file.      Brief Narrative    85 y.o.femalewith medical history significant ofbilateral hearing loss,age-related macular degeneration, hyperlipidemia, chronic diarrheawho was sent to ED byher assisted living facility for concerns of fever, weakness and increased confusion, her work-up was significant for UTI, as well she did test positive for COVID-19 pneumonia, so she was transferred to [facility] for further management.    Subjective:    Yvonne Edwards today unable to provide any complaints, but no significant events as discussed with staff.    Assessment  & Plan :    Principal Problem:   Acute lower UTI Active Problems:   AMD (age related macular degeneration)   Acute metabolic encephalopathy   Other pneumothorax   COVID-19 virus infection   Chronic diarrhea   UTI (urinary tract infection)   Dementia (HCC)   COVID-19 virus infection -Covid 19+ on admission to [facility]. -So far appears to be symptomatic with no oxygen requirement.  No active cough or respiratory distress. CXR with no infiltration for pneumonia.  -vitamin C, zinc.  -Continue to monitor inflammatory markers, CRP is trending down which is reassuring  COVID-19 Labs  Recent Labs    03/08/19 1359 03/09/19 0210 03/10/19 0015  DDIMER  --  0.71* 0.88*  FERRITIN 791*  --   --   LDH 170  --   --   CRP 10.9* 8.3* 5.0*    Lab Results  Component Value Date   SARSCOV2NAA POSITIVE (A) 03/07/2019     Altered  mental status:  -His acute metabolic encephalopathy in the setting of infectious process due to UTI and COVID-19 infection. -Likely due to UTI, CT head with no acute findings. -Significantly altered and lethargic yesterday, appears to be improved today, she is awake today, but still minimally communicative and able to follow  any commands.  Hypokalemia -Repleted  UTI -Culture growing Klebsiella, continue with IV Rocephin.  Right 5 mm apical pneumothorax: -  Incidental finding on CT spine. Patient is not hypoxic and not tachypneic on exam. The repeated CXR is negative for pneumothorax  Hypokalemia:  -Repleted  Chronic diarrhea and mild AP:  - pt is on immodium for this. CT of abdomen is unremarkable. -Checked C.diff on admission, pending results. -Hold Imodium until C diff negative -Hold laxatives   Hyperlipidemia -Continue rosuvastatin  Dementia -Continue mirtazapine  Protein calorie malnutrition: Moderate.  -consulted dietitian.  Code Status : DNR  Family Communication  : Patient has no allive  family members as discussed with caregiver [name] who has been her friend for last 20 years, and has been supervising her care  Disposition Plan  : Back to SNF  Consults  :  none  Procedures  : None  DVT Prophylaxis  :  [place] lovenox  Lab Results  Component Value Date   PLT 212 03/10/2019    Antibiotics  :    Anti-infectives (From admission, onward)   Start     Dose/Rate Route Frequency Ordered Stop   03/08/19 2200  cefTRIAXone (ROCEPHIN) 1 g in sodium chloride 0.9 % 100 mL IVPB     1 g 200 mL/hr over 30 Minutes Intravenous Every 24 hours 03/08/19 1925 03/12/19 2159        Objective:   Vitals:   03/10/19 0300 03/10/19 0400 03/10/19 0500 03/10/19 0730  BP:  (!) 129/58  139/60  Pulse: 76 78 80 79  Resp:    16  Temp:  (!) 97.5 F (36.4 C)  97.9 F (36.6 C)  TempSrc:  Oral  Oral  SpO2: 92% 92% 93% 95%  Weight:      Height:        Wt Readings  from Last 3 Encounters:  03/09/19 54.4 kg  03/07/19 47 kg  03/05/19 47 kg     Intake/Output Summary (Last 24 hours) at 03/10/2019 1212 Last data filed at 03/10/2019 1000 Gross per 24 hour  Intake 1059.35 ml  Output 200 ml  Net 859.35 ml     Physical Exam  Patient is awake, more alert today, vented, and able to answer questions appropriately , frail . Symmetrical Chest wall movement, Good air movement bilaterally, CTAB RRR,No Gallops,Rubs or new Murmurs, No Parasternal Heave +ve B.Sounds, Abd Soft, No tenderness, No rebound - guarding or rigidity. No Cyanosis, Clubbing or edema, No new Rash or bruise       Data Review:    CBC Recent Labs  Lab 03/05/19 1554 03/07/19 1526 03/08/19 0553 03/09/19 0210 03/10/19 0015  WBC 6.7 7.6 5.5 4.7 4.1  HGB 15.0 14.9 14.4 14.0 13.1  HCT 46.7* 44.4 43.9 43.1 40.8  PLT 167 202 181 202 212  MCV 90.2 86.0 88.9 90.2 91.3  MCH 29.0 28.9 29.1 29.3 29.3  MCHC 32.1 33.6 32.8 32.5 32.1  RDW 12.8 12.7 12.6 12.6 12.9  LYMPHSABS 0.7 0.6*  --  0.5* 0.7  MONOABS 0.6 0.7  --  0.7 0.6  EOSABS 0.0 0.0  --  0.0 0.0  BASOSABS 0.0 0.0  --  0.0 0.0    Chemistries  Recent Labs  Lab 03/05/19 1554 03/07/19 1526 03/08/19 0553 03/09/19 0210 03/10/19 0015  NA 136 135 138 138 141  K 4.1 3.3* 3.9 3.8 3.3*  CL 98 95* 100 101 106  CO2 24 27 26 26 26   GLUCOSE 96 109* 95  97 98  BUN 24* CREATININE 1.02* 0.79 0.82 0.70 0.75  CALCIUM 9.2 9.0 8.6* 8.5* 8.1*  MG  --  1.8  --   --   --   AST 38 32  --  31 27  ALT 23 21  --  20 19  ALKPHOS 60 61  --  66 61  BILITOT 1.0 0.8  --  0.7 0.3   ------------------------------------------------------------------------------------------------------------------ No results for input(s): CHOL, HDL, LDLCALC, TRIG, CHOLHDL, LDLDIRECT in the last 72 hours.  Lab Results  Component Value Date   HGBA1C 5.9 (H) 12/05/2016    ------------------------------------------------------------------------------------------------------------------ No results for input(s): TSH, T4TOTAL, T3FREE, THYROIDAB in the last 72 hours.  Invalid input(s): FREET3 ------------------------------------------------------------------------------------------------------------------ Recent Labs    03/08/19 1359  FERRITIN 791*    Coagulation profile No results for input(s): INR, PROTIME in the last 168 hours.  Recent Labs    03/09/19 0210 03/10/19 0015  DDIMER 0.71* 0.88*    Cardiac Enzymes No results for input(s): CKMB, TROPONINI, MYOGLOBIN in the last 168 hours.  Invalid input(s): CK ------------------------------------------------------------------------------------------------------------------    Component Value Date/Time   BNP 134.0 (H) 03/08/2019 0553    Inpatient Medications  Scheduled Meds:  feeding supplement (ENSURE ENLIVE)  237 mL Oral TID BM   heparin injection (subcutaneous)  5,000 Units Subcutaneous Q8H   influenza vaccine adjuvanted  0.5 mL Intramuscular Tomorrow-1000   mouth rinse  15 mL Mouth Rinse BID   mirtazapine  7.5 mg Oral QHS   pneumococcal 23 valent vaccine  0.5 mL Intramuscular Tomorrow-1000   rosuvastatin  10 mg Oral QHS   sodium chloride flush  3 mL Intravenous Q12H   vitamin C  500 mg Oral Daily   zinc sulfate  220 mg Oral Daily   Continuous Infusions:  sodium chloride     cefTRIAXone (ROCEPHIN)  IV Stopped (03/09/19 2205)   PRN Meds:.sodium chloride, acetaminophen, guaiFENesin-dextromethorphan, ondansetron **OR** ondansetron (ZOFRAN) IV, sodium chloride flush  Micro Results Recent Results (from the past 240 hour(s))  Urine culture     Status: Abnormal   Collection Time: 03/07/19  3:26 PM   Specimen: Urine, Catheterized  Result Value Ref Range Status   Specimen Description   Final    URINE, CATHETERIZED Performed at [facility], [address]  [place], [place] [address]    Special Requests   Final    NONE Performed at [facility], [address] [place], [place] [address]    Culture >=100,000 COLONIES/mL KLEBSIELLA PNEUMONIAE (A)  Final   Report Status 03/09/2019 FINAL  Final   Organism ID, Bacteria KLEBSIELLA PNEUMONIAE (A)  Final      Susceptibility   Klebsiella pneumoniae - MIC*    AMPICILLIN >=32 RESISTANT Resistant     CEFAZOLIN <=4 SENSITIVE Sensitive     CEFTRIAXONE <=1 SENSITIVE Sensitive     CIPROFLOXACIN <=0.25 SENSITIVE Sensitive     GENTAMICIN <=1 SENSITIVE Sensitive     IMIPENEM <=0.25 SENSITIVE Sensitive     NITROFURANTOIN 64 INTERMEDIATE Intermediate     TRIMETH/SULFA <=20 SENSITIVE Sensitive     AMPICILLIN/SULBACTAM 4 SENSITIVE Sensitive     PIP/TAZO 8 SENSITIVE Sensitive     Extended ESBL NEGATIVE Sensitive     * >=100,000 COLONIES/mL KLEBSIELLA PNEUMONIAE  SARS CORONAVIRUS 2 (TAT 6-24 HRS) Nasopharyngeal Nasopharyngeal Swab     Status: Abnormal   Collection Time: 03/07/19  6:32 PM   Specimen: Nasopharyngeal Swab  Result Value Ref Range Status  SARS Coronavirus 2 POSITIVE (A) NEGATIVE Final    Comment: RESULT CALLED TO, READ BACK BY AND VERIFIED WITH: WOODS B, RN AT [phone] ON 03/08/2019 BY SAINVILUS S (NOTE) SARS-CoV-2 target nucleic acids are DETECTED. The SARS-CoV-2 RNA is generally detectable in upper and lower respiratory specimens during the acute phase of infection. Positive results are indicative of active infection with SARS-CoV-2. Clinical  correlation with patient history and other diagnostic information is necessary to determine patient infection status. Positive results do  not rule out bacterial infection or co-infection with other viruses. The expected result is Negative. Fact Sheet for Patients: HairSlick.nohttps://www.fda.gov/media/138098/download Fact Sheet for Healthcare Providers: quierodirigir.comhttps://www.fda.gov/media/138095/download This test is not yet approved or cleared by the [place]  FDA and  has been authorized for detection and/or diagnosis of SARS-CoV-2 by FDA under an Emergency Use Authorization (EUA). This EUA will remain  in effect (meaning this test can b e used) for the duration of the COVID-19 declaration under Section 564(b)(1) of the Act, 21 U.S.C. section 360bbb-3(b)(1), unless the authorization is terminated or revoked sooner. Performed at [facility], [address] [place], [place] [address]     Radiology Reports Ct Head Wo Contrast  Result Date: 03/07/2019 CLINICAL DATA:  85 year old female with altered mental status. EXAM: CT HEAD WITHOUT CONTRAST CT CERVICAL SPINE WITHOUT CONTRAST TECHNIQUE: Multidetector CT imaging of the head and cervical spine was performed following the standard protocol without intravenous contrast. Multiplanar CT image reconstructions of the cervical spine were also generated. COMPARISON:  None. FINDINGS: CT HEAD FINDINGS Brain: Moderate to advanced age-related atrophy and chronic microvascular ischemic changes. There is no acute intracranial hemorrhage. No mass effect or midline shift. No extra-axial fluid collection. Vascular: No hyperdense vessel or unexpected calcification. Skull: Normal. Negative for fracture or focal lesion. Sinuses/Orbits: No acute finding. Other: None CT CERVICAL SPINE FINDINGS Alignment: No acute subluxation. Grade 1 C3-C4 anterolisthesis. Skull base and vertebrae: No acute fracture. Osteopenia. Soft tissues and spinal canal: No prevertebral fluid or swelling. No visible canal hematoma. Disc levels: Multilevel degenerative changes with endplate irregularity and disc space narrowing. Upper chest: Biapical subpleural scarring. There is a 5 mm right apical pneumothorax. This is new compared to the CT of 12/09/2016. Other: Bilateral carotid bulb calcified plaques. IMPRESSION: 1. No acute intracranial hemorrhage. Moderate to advanced age-related atrophy and chronic microvascular ischemic changes. 2. No  acute/traumatic cervical spine pathology. Multilevel degenerative changes. 3. A 5 mm right apical pneumothorax. These results were called by telephone at the time of interpretation on 03/07/2019 at 4:29 pm to provider [facility] , who verbally acknowledged these results. Electronically Signed   By: Elgie CollardArash  Radparvar M.D.   On: 03/07/2019 16:30   Ct Cervical Spine Wo Contrast  Result Date: 03/07/2019 CLINICAL DATA:  85 year old female with altered mental status. EXAM: CT HEAD WITHOUT CONTRAST CT CERVICAL SPINE WITHOUT CONTRAST TECHNIQUE: Multidetector CT imaging of the head and cervical spine was performed following the standard protocol without intravenous contrast. Multiplanar CT image reconstructions of the cervical spine were also generated. COMPARISON:  None. FINDINGS: CT HEAD FINDINGS Brain: Moderate to advanced age-related atrophy and chronic microvascular ischemic changes. There is no acute intracranial hemorrhage. No mass effect or midline shift. No extra-axial fluid collection. Vascular: No hyperdense vessel or unexpected calcification. Skull: Normal. Negative for fracture or focal lesion. Sinuses/Orbits: No acute finding. Other: None CT CERVICAL SPINE FINDINGS Alignment: No acute subluxation. Grade 1 C3-C4 anterolisthesis. Skull base and vertebrae: No acute fracture. Osteopenia. Soft tissues and spinal  canal: No prevertebral fluid or swelling. No visible canal hematoma. Disc levels: Multilevel degenerative changes with endplate irregularity and disc space narrowing. Upper chest: Biapical subpleural scarring. There is a 5 mm right apical pneumothorax. This is new compared to the CT of 12/09/2016. Other: Bilateral carotid bulb calcified plaques. IMPRESSION: 1. No acute intracranial hemorrhage. Moderate to advanced age-related atrophy and chronic microvascular ischemic changes. 2. No acute/traumatic cervical spine pathology. Multilevel degenerative changes. 3. A 5 mm right apical pneumothorax. These  results were called by telephone at the time of interpretation on 03/07/2019 at 4:29 pm to provider [facility] , who verbally acknowledged these results. Electronically Signed   By: Anner Crete M.D.   On: 03/07/2019 16:30   Ct Abdomen Pelvis W Contrast  Result Date: 03/05/2019 CLINICAL DATA:  Upper abdominal pain, diarrhea, decreased appetite EXAM: CT ABDOMEN AND PELVIS WITH CONTRAST TECHNIQUE: Multidetector CT imaging of the abdomen and pelvis was performed using the standard protocol following bolus administration of intravenous contrast. CONTRAST:  9mL OMNIPAQUE IOHEXOL 300 MG/ML  SOLN COMPARISON:  12/20/2012 FINDINGS: Technical note: Examination is degraded by patient motion artifact. Additional metallic streak/beam hardening artifact from right total hip arthroplasty hardware degrades evaluation of the deep pelvis. Lower chest: Trace right pleural effusion with right basilar atelectasis and small dependent areas of airspace consolidation. Left lung base clear. Coronary artery calcifications. Hepatobiliary: No focal liver abnormality is seen. Status post cholecystectomy. No biliary dilatation. Pancreas: Grossly unremarkable. Spleen: Normal in size without focal abnormality. Adrenals/Urinary Tract: Adrenal glands and bilateral kidneys appear grossly unremarkable. Urinary bladder is partially obscured by artifact. No discrete abnormality. Stomach/Bowel: Negative for bowel obstruction. Small volume liquid stool within bowel suggesting diarrheal state. No definite bowel thickening or inflammatory changes are evident within the limitations of this exam. Vascular/Lymphatic: Aortic atherosclerosis. No enlarged abdominal or pelvic lymph nodes. Reproductive: No appreciable abnormality. No adnexal mass. Other: No ascites. No abdominal wall hernia. Musculoskeletal: Partially visualized right total hip arthroplasty hardware. Degenerative disc disease of the lumbar spine. There is a dextroscoliotic curvature of  the lumbar spine. No acute osseous findings are evident. IMPRESSION: 1. Somewhat limited exam.  No acute abdominopelvic findings. 2. Trace right pleural effusion with right basilar atelectasis and small dependent areas of airspace consolidation. 3. Status post cholecystectomy. 4. Aortic atherosclerosis and coronary artery calcifications. Electronically Signed   By: Davina Poke M.D.   On: 03/05/2019 17:17   Dg Chest Port 1 View  Result Date: 03/08/2019 CLINICAL DATA:  COVID positive, evaluate for pneumothorax EXAM: PORTABLE CHEST 1 VIEW COMPARISON:  2018 FINDINGS: Chronic mild interstitial prominence. No pleural effusion or pneumothorax. Stable cardiomediastinal contours with normal heart size. Calcified plaque along the thoracic aorta. IMPRESSION: No pneumothorax. Electronically Signed   By: Macy Mis M.D.   On: 03/08/2019 08:35   Dg Chest Portable 1 View  Result Date: 03/07/2019 CLINICAL DATA:  Fever and weakness. EXAM: PORTABLE CHEST 1 VIEW COMPARISON:  Chest x-ray dated December 09, 2016. FINDINGS: The heart size and mediastinal contours are within normal limits. Atherosclerotic calcification of the thoracic aorta. Normal pulmonary vascularity. No focal consolidation, pleural effusion, or pneumothorax. No acute osseous abnormality. IMPRESSION: No active disease. Electronically Signed   By: Titus Dubin M.D.   On: 03/07/2019 15:59      Phillips Climes M.D on 03/10/2019 at 12:12 PM  Between 7am to 7pm - Pager - [phone]  After 7pm go to [email or web address] - password [facility]  [facility] -  Office  [phone]

## 2019-03-10 NOTE — Progress Notes (Signed)
   Vital Signs MEWS/VS Documentation      03/10/2019 0300 03/10/2019 0400 03/10/2019 0500 03/10/2019 0730   MEWS Score:  2  2  2  1    MEWS Score Color:  Yellow  Yellow  Yellow  Green   Resp:  -  -  -  16   Pulse:  76  78  80  79   BP:  -  (!) 129/58  -  139/60   Temp:  -  (!) 97.5 F (36.4 C)  -  97.9 F (36.6 C)   O2 Device:  -  Room Air  -  Room Air      Pt found to be on yellow MEWS with a score of 2 based off of her respirations and BP this AM after shift change. Vitals rechecked, pt stable in no signs of distress, pt now with green mews.      Christella Hartigan 03/10/2019,7:37 AM

## 2019-03-11 LAB — COMPREHENSIVE METABOLIC PANEL
ALT: 20 U/L (ref 0–44)
AST: 33 U/L (ref 15–41)
Albumin: 2.5 g/dL — ABNORMAL LOW (ref 3.5–5.0)
Alkaline Phosphatase: 62 U/L (ref 38–126)
Anion gap: 7 (ref 5–15)
BUN: 20 mg/dL (ref 8–23)
CO2: 29 mmol/L (ref 22–32)
Calcium: 8.2 mg/dL — ABNORMAL LOW (ref 8.9–10.3)
Chloride: 105 mmol/L (ref 98–111)
Creatinine, Ser: 0.6 mg/dL (ref 0.44–1.00)
GFR calc Af Amer: >60 mL/min (ref >60)
GFR calc non Af Amer: >60 mL/min (ref >60)
Glucose, Bld: 110 mg/dL — ABNORMAL HIGH (ref 70–99)
Potassium: 4 mmol/L (ref 3.5–5.1)
Sodium: 141 mmol/L (ref 135–145)
Total Bilirubin: 0.8 mg/dL (ref 0.3–1.2)
Total Protein: 5.5 g/dL — ABNORMAL LOW (ref 6.5–8.1)

## 2019-03-11 LAB — CBC WITH DIFFERENTIAL/PLATELET
Abs Immature Granulocytes: 0.03 10*3/uL (ref 0.00–0.07)
Basophils Absolute: 0 10*3/uL (ref 0.0–0.1)
Basophils Relative: 1 %
Eosinophils Absolute: 0.1 10*3/uL (ref 0.0–0.5)
Eosinophils Relative: 2 %
HCT: 41.2 % (ref 36.0–46.0)
Hemoglobin: 13.3 g/dL (ref 12.0–15.0)
Immature Granulocytes: 1 %
Lymphocytes Relative: 19 %
Lymphs Abs: 0.8 10*3/uL (ref 0.7–4.0)
MCH: 29.2 pg (ref 26.0–34.0)
MCHC: 32.3 g/dL (ref 30.0–36.0)
MCV: 90.4 fL (ref 80.0–100.0)
Monocytes Absolute: 0.7 10*3/uL (ref 0.1–1.0)
Monocytes Relative: 17 %
Neutro Abs: 2.5 10*3/uL (ref 1.7–7.7)
Neutrophils Relative %: 60 %
Platelets: 228 10*3/uL (ref 150–400)
RBC: 4.56 MIL/uL (ref 3.87–5.11)
RDW: 12.9 % (ref 11.5–15.5)
WBC: 4.1 10*3/uL (ref 4.0–10.5)
nRBC: 0 % (ref 0.0–0.2)

## 2019-03-11 LAB — PHOSPHORUS: Phosphorus: 1.9 mg/dL — ABNORMAL LOW (ref 2.5–4.6)

## 2019-03-11 LAB — D-DIMER, QUANTITATIVE: D-Dimer, Quant: 0.61 ug/mL-FEU — ABNORMAL HIGH (ref 0.00–0.50)

## 2019-03-11 LAB — C-REACTIVE PROTEIN: CRP: 3.1 mg/dL — ABNORMAL HIGH (ref <1.0)

## 2019-03-11 MED ORDER — POTASSIUM PHOSPHATES 15 MMOLE/5ML IV SOLN
20.0000 mmol | Freq: Once | INTRAVENOUS | Status: AC
  Administered 2019-03-11 (×2): 20 mmol via INTRAVENOUS
  Filled 2019-03-11: qty 6.67

## 2019-03-11 NOTE — Progress Notes (Addendum)
PROGRESS NOTE                                                                                                                                                                                                             Patient Demographics:    Carlia Bomkamp, is a 85 y.o. female, DOB - 11-30-1918, OHY:[id number]  Admit date - 03/08/2019   Admitting Physician Ivor Costa, MD  Outpatient Primary MD for the patient is Tracie Harrier, MD  LOS - 2   No chief complaint on file.      Brief Narrative    85 y.o.femalewith medical history significant ofbilateral hearing loss,age-related macular degeneration, hyperlipidemia, chronic diarrheawho was sent to ED byher assisted living facility for concerns of fever, weakness and increased confusion, her work-up was significant for UTI, as well she did test positive for COVID-19 pneumonia, so she was transferred to [facility] for further management.    Subjective:    [name] today no significant events as discussed with staff, she remains with poor oral intake .    Assessment  & Plan :    Principal Problem:   Acute lower UTI Active Problems:   AMD (age related macular degeneration)   Acute metabolic encephalopathy   Other pneumothorax   COVID-19 virus infection   Chronic diarrhea   UTI (urinary tract infection)   Dementia ([place])   COVID-19 virus infection -Covid 19+ on admission to [facility]. -So far appears to be symptomatic with no oxygen requirement.  No active cough or respiratory distress. CXR with no infiltration for pneumonia.  -Continue with zinc and vitamin C-Continue to monitor inflammatory markers, CRP is trending down which is reassuring  COVID-19 Labs  Recent Labs    03/08/19 1359 03/09/19 0210 03/10/19 0015 03/11/19 0405  DDIMER  --  0.71* 0.88* 0.61*  FERRITIN 791*  --   --   --   LDH 170  --   --   --   CRP 10.9* 8.3* 5.0* 3.1*    Lab Results  Component Value Date   SARSCOV2NAA POSITIVE (A) 03/07/2019     Altered mental status:  - acute metabolic encephalopathy in the setting of infectious process due to UTI and COVID-19 infection. -Likely due to UTI, CT head with no acute findings. -Status appears to be improving, but slowly  Hypokalemia/hypophosphatemia -Repleted, recheck  in a.m.  UTI -Culture growing Klebsiella, continue with IV Rocephin.  Right 5 mm apical pneumothorax: -  Incidental finding on CT spine. Patient is not hypoxic and not tachypneic on exam. The repeated CXR is negative for pneumothorax  Hypokalemia:  -Repleted  Chronic diarrhea and mild AP:  - pt is on immodium for this. CT of abdomen is unremarkable. -Checked C.diff on admission, pending results. -Hold Imodium until C diff negative -Hold laxatives   Hyperlipidemia -Continue rosuvastatin  Dementia -Continue mirtazapine  Protein calorie malnutrition: Moderate.  -consulted dietitian.  Code Status : DNR  Family Communication  : Patient has no allive  family members as discussed with caregiver [name] on 11/21, who has been her friend for last 20 years, and has been supervising her care  Disposition Plan  : Back to SNF  Consults  :  none  Procedures  : None  DVT Prophylaxis  :  [place] lovenox  Lab Results  Component Value Date   PLT 228 03/11/2019    Antibiotics  :    Anti-infectives (From admission, onward)   Start     Dose/Rate Route Frequency Ordered Stop   03/08/19 2200  cefTRIAXone (ROCEPHIN) 1 g in sodium chloride 0.9 % 100 mL IVPB     1 g 200 mL/hr over 30 Minutes Intravenous Every 24 hours 03/08/19 1925 03/12/19 2159        Objective:   Vitals:   03/10/19 0730 03/10/19 2050 03/11/19 0400 03/11/19 0739  BP: 139/60 (!) 126/59 140/87 121/68  Pulse: 79 84 85 78  Resp: 16   18  Temp: 97.9 F (36.6 C) 97.8 F (36.6 C) 98.2 F (36.8 C) 98 F (36.7 C)  TempSrc: Oral Oral Oral Oral  SpO2: 95% 92% 94% 91%  Weight:      Height:         Wt Readings from Last 3 Encounters:  03/09/19 54.4 kg  03/07/19 47 kg  03/05/19 47 kg     Intake/Output Summary (Last 24 hours) at 03/11/2019 1108 Last data filed at 03/11/2019 0926 Gross per 24 hour  Intake 473 ml  Output 700 ml  Net -227 ml     Physical Exam  Patient is sleeping comfortably this morning, in no apparent distress, frail Symmetrical Chest wall movement, Good air movement bilaterally, CTAB RRR,No Gallops,Rubs, No Parasternal Heave +ve B.Sounds, Abd Soft, No tenderness, No rebound - guarding or rigidity. No Cyanosis, Clubbing or edema, No new Rash or bruise        Data Review:    CBC Recent Labs  Lab 03/05/19 1554 03/07/19 1526 03/08/19 0553 03/09/19 0210 03/10/19 0015 03/11/19 0405  WBC 6.7 7.6 5.5 4.7 4.1 4.1  HGB 15.0 14.9 14.4 14.0 13.1 13.3  HCT 46.7* 44.4 43.9 43.1 40.8 41.2  PLT 167 202 181 202 212 228  MCV 90.2 86.0 88.9 90.2 91.3 90.4  MCH 29.0 28.9 29.1 29.3 29.3 29.2  MCHC 32.1 33.6 32.8 32.5 32.1 32.3  RDW 12.8 12.7 12.6 12.6 12.9 12.9  LYMPHSABS 0.7 0.6*  --  0.5* 0.7 0.8  MONOABS 0.6 0.7  --  0.7 0.6 0.7  EOSABS 0.0 0.0  --  0.0 0.0 0.1  BASOSABS 0.0 0.0  --  0.0 0.0 0.0    Chemistries  Recent Labs  Lab 03/05/19 1554 03/07/19 1526 03/08/19 0553 03/09/19 0210 03/10/19 0015 03/11/19 0405  NA 136 135 138 138 141 141  K 4.1 3.3* 3.9 3.8 3.3* 4.0  CL 98 95*  100 101 106 105  CO2 GLUCOSE 96 109* 95 97 98 110*  BUN 24* CREATININE 1.02* 0.79 0.82 0.70 0.75 0.60  CALCIUM 9.2 9.0 8.6* 8.5* 8.1* 8.2*  MG  --  1.8  --   --   --   --   AST 38 32  --  31 27 33  ALT 23 21  --  ALKPHOS 60 61  --  66 61 62  BILITOT 1.0 0.8  --  0.7 0.3 0.8   ------------------------------------------------------------------------------------------------------------------ No results for input(s): CHOL, HDL, LDLCALC, TRIG, CHOLHDL, LDLDIRECT in the last 72 hours.  Lab Results  Component Value  Date   HGBA1C 5.9 (H) 12/05/2016   ------------------------------------------------------------------------------------------------------------------ No results for input(s): TSH, T4TOTAL, T3FREE, THYROIDAB in the last 72 hours.  Invalid input(s): FREET3 ------------------------------------------------------------------------------------------------------------------ Recent Labs    03/08/19 1359  FERRITIN 791*    Coagulation profile No results for input(s): INR, PROTIME in the last 168 hours.  Recent Labs    03/10/19 0015 03/11/19 0405  DDIMER 0.88* 0.61*    Cardiac Enzymes No results for input(s): CKMB, TROPONINI, MYOGLOBIN in the last 168 hours.  Invalid input(s): CK ------------------------------------------------------------------------------------------------------------------    Component Value Date/Time   BNP 134.0 (H) 03/08/2019 0553    Inpatient Medications  Scheduled Meds:  feeding supplement (ENSURE ENLIVE)  237 mL Oral TID BM   heparin injection (subcutaneous)  5,000 Units Subcutaneous Q8H   influenza vaccine adjuvanted  0.5 mL Intramuscular Tomorrow-1000   mouth rinse  15 mL Mouth Rinse BID   mirtazapine  7.5 mg Oral QHS   pneumococcal 23 valent vaccine  0.5 mL Intramuscular Tomorrow-1000   rosuvastatin  10 mg Oral QHS   sodium chloride flush  3 mL Intravenous Q12H   vitamin C  500 mg Oral Daily   zinc sulfate  220 mg Oral Daily   Continuous Infusions:  sodium chloride     cefTRIAXone (ROCEPHIN)  IV 1 g (03/10/19 2115)   potassium PHOSPHATE IVPB (in mmol) 20 mmol (03/11/19 0925)   PRN Meds:.sodium chloride, acetaminophen, guaiFENesin-dextromethorphan, ondansetron **OR** ondansetron (ZOFRAN) IV, sodium chloride flush  Micro Results Recent Results (from the past 240 hour(s))  Urine culture     Status: Abnormal   Collection Time: 03/07/19  3:26 PM   Specimen: Urine, Catheterized  Result Value Ref Range Status   Specimen Description    Final    URINE, CATHETERIZED Performed at [facility], [address], [place], [place] [address]    Special Requests   Final    NONE Performed at [facility], [address] [place], [place] [address]    Culture >=100,000 COLONIES/mL KLEBSIELLA PNEUMONIAE (A)  Final   Report Status 03/09/2019 FINAL  Final   Organism ID, Bacteria KLEBSIELLA PNEUMONIAE (A)  Final      Susceptibility   Klebsiella pneumoniae - MIC*    AMPICILLIN >=32 RESISTANT Resistant     CEFAZOLIN <=4 SENSITIVE Sensitive     CEFTRIAXONE <=1 SENSITIVE Sensitive     CIPROFLOXACIN <=0.25 SENSITIVE Sensitive     GENTAMICIN <=1 SENSITIVE Sensitive     IMIPENEM <=0.25 SENSITIVE Sensitive     NITROFURANTOIN 64 INTERMEDIATE Intermediate     TRIMETH/SULFA <=20 SENSITIVE Sensitive     AMPICILLIN/SULBACTAM 4 SENSITIVE Sensitive     PIP/TAZO 8 SENSITIVE Sensitive     Extended ESBL NEGATIVE Sensitive     * >=100,000  COLONIES/mL KLEBSIELLA PNEUMONIAE  SARS CORONAVIRUS 2 (TAT 6-24 HRS) Nasopharyngeal Nasopharyngeal Swab     Status: Abnormal   Collection Time: 03/07/19  6:32 PM   Specimen: Nasopharyngeal Swab  Result Value Ref Range Status   SARS Coronavirus 2 POSITIVE (A) NEGATIVE Final    Comment: RESULT CALLED TO, READ BACK BY AND VERIFIED WITH: WOODS B, RN AT 0348 ON 03/08/2019 BY SAINVILUS S (NOTE) SARS-CoV-2 target nucleic acids are DETECTED. The SARS-CoV-2 RNA is generally detectable in upper and lower respiratory specimens during the acute phase of infection. Positive results are indicative of active infection with SARS-CoV-2. Clinical  correlation with patient history and other diagnostic information is necessary to determine patient infection status. Positive results do  not rule out bacterial infection or co-infection with other viruses. The expected result is Negative. Fact Sheet for Patients: HairSlick.nohttps://www.fda.gov/media/138098/download Fact Sheet for Healthcare  Providers: quierodirigir.comhttps://www.fda.gov/media/138095/download This test is not yet approved or cleared by the [place] FDA and  has been authorized for detection and/or diagnosis of SARS-CoV-2 by FDA under an Emergency Use Authorization (EUA). This EUA will remain  in effect (meaning this test can b e used) for the duration of the COVID-19 declaration under Section 564(b)(1) of the Act, 21 U.S.C. section 360bbb-3(b)(1), unless the authorization is terminated or revoked sooner. Performed at [facility], [address] [place], [place] [address]     Radiology Reports Ct Head Wo Contrast  Result Date: 03/07/2019 CLINICAL DATA:  85 year old female with altered mental status. EXAM: CT HEAD WITHOUT CONTRAST CT CERVICAL SPINE WITHOUT CONTRAST TECHNIQUE: Multidetector CT imaging of the head and cervical spine was performed following the standard protocol without intravenous contrast. Multiplanar CT image reconstructions of the cervical spine were also generated. COMPARISON:  None. FINDINGS: CT HEAD FINDINGS Brain: Moderate to advanced age-related atrophy and chronic microvascular ischemic changes. There is no acute intracranial hemorrhage. No mass effect or midline shift. No extra-axial fluid collection. Vascular: No hyperdense vessel or unexpected calcification. Skull: Normal. Negative for fracture or focal lesion. Sinuses/Orbits: No acute finding. Other: None CT CERVICAL SPINE FINDINGS Alignment: No acute subluxation. Grade 1 C3-C4 anterolisthesis. Skull base and vertebrae: No acute fracture. Osteopenia. Soft tissues and spinal canal: No prevertebral fluid or swelling. No visible canal hematoma. Disc levels: Multilevel degenerative changes with endplate irregularity and disc space narrowing. Upper chest: Biapical subpleural scarring. There is a 5 mm right apical pneumothorax. This is new compared to the CT of 12/09/2016. Other: Bilateral carotid bulb calcified plaques. IMPRESSION: 1. No acute  intracranial hemorrhage. Moderate to advanced age-related atrophy and chronic microvascular ischemic changes. 2. No acute/traumatic cervical spine pathology. Multilevel degenerative changes. 3. A 5 mm right apical pneumothorax. These results were called by telephone at the time of interpretation on 03/07/2019 at 4:29 pm to provider [facility] , who verbally acknowledged these results. Electronically Signed   By: Elgie CollardArash  Radparvar M.D.   On: 03/07/2019 16:30   Ct Cervical Spine Wo Contrast  Result Date: 03/07/2019 CLINICAL DATA:  85 year old female with altered mental status. EXAM: CT HEAD WITHOUT CONTRAST CT CERVICAL SPINE WITHOUT CONTRAST TECHNIQUE: Multidetector CT imaging of the head and cervical spine was performed following the standard protocol without intravenous contrast. Multiplanar CT image reconstructions of the cervical spine were also generated. COMPARISON:  None. FINDINGS: CT HEAD FINDINGS Brain: Moderate to advanced age-related atrophy and chronic microvascular ischemic changes. There is no acute intracranial hemorrhage. No mass effect or midline shift. No extra-axial fluid collection. Vascular: No hyperdense vessel or  unexpected calcification. Skull: Normal. Negative for fracture or focal lesion. Sinuses/Orbits: No acute finding. Other: None CT CERVICAL SPINE FINDINGS Alignment: No acute subluxation. Grade 1 C3-C4 anterolisthesis. Skull base and vertebrae: No acute fracture. Osteopenia. Soft tissues and spinal canal: No prevertebral fluid or swelling. No visible canal hematoma. Disc levels: Multilevel degenerative changes with endplate irregularity and disc space narrowing. Upper chest: Biapical subpleural scarring. There is a 5 mm right apical pneumothorax. This is new compared to the CT of 12/09/2016. Other: Bilateral carotid bulb calcified plaques. IMPRESSION: 1. No acute intracranial hemorrhage. Moderate to advanced age-related atrophy and chronic microvascular ischemic changes. 2. No  acute/traumatic cervical spine pathology. Multilevel degenerative changes. 3. A 5 mm right apical pneumothorax. These results were called by telephone at the time of interpretation on 03/07/2019 at 4:29 pm to provider [employer] , who verbally acknowledged these results. Electronically Signed   By: Elgie Collard M.D.   On: 03/07/2019 16:30   Ct Abdomen Pelvis W Contrast  Result Date: 03/05/2019 CLINICAL DATA:  Upper abdominal pain, diarrhea, decreased appetite EXAM: CT ABDOMEN AND PELVIS WITH CONTRAST TECHNIQUE: Multidetector CT imaging of the abdomen and pelvis was performed using the standard protocol following bolus administration of intravenous contrast. CONTRAST:  58mL OMNIPAQUE IOHEXOL 300 MG/ML  SOLN COMPARISON:  12/20/2012 FINDINGS: Technical note: Examination is degraded by patient motion artifact. Additional metallic streak/beam hardening artifact from right total hip arthroplasty hardware degrades evaluation of the deep pelvis. Lower chest: Trace right pleural effusion with right basilar atelectasis and small dependent areas of airspace consolidation. Left lung base clear. Coronary artery calcifications. Hepatobiliary: No focal liver abnormality is seen. Status post cholecystectomy. No biliary dilatation. Pancreas: Grossly unremarkable. Spleen: Normal in size without focal abnormality. Adrenals/Urinary Tract: Adrenal glands and bilateral kidneys appear grossly unremarkable. Urinary bladder is partially obscured by artifact. No discrete abnormality. Stomach/Bowel: Negative for bowel obstruction. Small volume liquid stool within bowel suggesting diarrheal state. No definite bowel thickening or inflammatory changes are evident within the limitations of this exam. Vascular/Lymphatic: Aortic atherosclerosis. No enlarged abdominal or pelvic lymph nodes. Reproductive: No appreciable abnormality. No adnexal mass. Other: No ascites. No abdominal wall hernia. Musculoskeletal: Partially visualized right  total hip arthroplasty hardware. Degenerative disc disease of the lumbar spine. There is a dextroscoliotic curvature of the lumbar spine. No acute osseous findings are evident. IMPRESSION: 1. Somewhat limited exam.  No acute abdominopelvic findings. 2. Trace right pleural effusion with right basilar atelectasis and small dependent areas of airspace consolidation. 3. Status post cholecystectomy. 4. Aortic atherosclerosis and coronary artery calcifications. Electronically Signed   By: Duanne Guess M.D.   On: 03/05/2019 17:17   Dg Chest Port 1 View  Result Date: 03/08/2019 CLINICAL DATA:  COVID positive, evaluate for pneumothorax EXAM: PORTABLE CHEST 1 VIEW COMPARISON:  2018 FINDINGS: Chronic mild interstitial prominence. No pleural effusion or pneumothorax. Stable cardiomediastinal contours with normal heart size. Calcified plaque along the thoracic aorta. IMPRESSION: No pneumothorax. Electronically Signed   By: Guadlupe Spanish M.D.   On: 03/08/2019 08:35   Dg Chest Portable 1 View  Result Date: 03/07/2019 CLINICAL DATA:  Fever and weakness. EXAM: PORTABLE CHEST 1 VIEW COMPARISON:  Chest x-ray dated December 09, 2016. FINDINGS: The heart size and mediastinal contours are within normal limits. Atherosclerotic calcification of the thoracic aorta. Normal pulmonary vascularity. No focal consolidation, pleural effusion, or pneumothorax. No acute osseous abnormality. IMPRESSION: No active disease. Electronically Signed   By: Obie Dredge M.D.   On: 03/07/2019 15:59  Huey Bienenstock M.D on 03/11/2019 at 11:08 AM  Between 7am to 7pm - Pager - [phone]  After 7pm go to [email or web address] - password [facility]  [facility] -  Office  [phone]

## 2019-03-11 NOTE — Progress Notes (Signed)
Received patient on transfer from 1w to 3rd floor room 304. Patient moved to a regular bed and to be found incontinent of urine. Peri care rendered and bed pads changed. IV flushed with aseptic technique and K-phos restarted at original rate. Patient is HOH and oriented x 1. Call bell in reach. Bed in lowest position and locked.

## 2019-03-11 NOTE — Evaluation (Signed)
Physical Therapy Evaluation Patient Details Name: Yvonne Edwards MRN: [id number] DOB: [birth year] Today's Date: 03/11/2019   History of Present Illness  Pt is a 85 y.o. female admitted from ALF on 03/08/19 with fever, weakness, AMS; workup significant for UTI and (+) COVID-19. Head CT negative for acute findings. PMH includes hearing loss, macular degeneration, dementia.    Clinical Impression  Pt presents with an overall decrease in functional mobility secondary to above. Pt poor historian and very HOH; per chart, from ALF. Today, pt required min-modA for mobility, limited by fatigue and generalized weakness. Pt would benefit from continued acute PT services to maximize functional mobility and independence prior to return to ALF. If ALF not able to provide adequate physical assist, would benefit from SNF-level therapies.   BP 107/53, SpO2 >90% on RA    Follow Up Recommendations SNF;Supervision for mobility/OOB (return to ALF if able to provide adequate assist)    Equipment Recommendations  None recommended by PT    Recommendations for Other Services       Precautions / Restrictions Precautions Precautions: Fall Restrictions Weight Bearing Restrictions: No      Mobility  Bed Mobility Overal bed mobility: Needs Assistance Bed Mobility: Supine to Sit     Supine to sit: Mod assist     General bed mobility comments: ModA to initiate movement then minA scootinghips to EOB  Transfers Overall transfer level: Needs assistance Equipment used: Rolling walker (2 wheeled) Transfers: Sit to/from Stand Sit to Stand: Mod assist         General transfer comment: Min-modA for trunk elevation to RW  Ambulation/Gait Ambulation/Gait assistance: Min assist Gait Distance (Feet): 2 Feet Assistive device: Rolling walker (2 wheeled) Gait Pattern/deviations: Step-to pattern;Shuffle;Trunk flexed Gait velocity: Decreased   General Gait Details: Steps from bed to recliner with RW and  minA, pt reaching for chair instead of RW requiring steadying assist  Stairs            Wheelchair Mobility    Modified Rankin (Stroke Patients Only)       Balance Overall balance assessment: Needs assistance   Sitting balance-Leahy Scale: Fair       Standing balance-Leahy Scale: Poor Standing balance comment: Reliant on UE support and external assist                             Pertinent Vitals/Pain Pain Assessment: Faces Faces Pain Scale: No hurt Pain Intervention(s): Monitored during session    Home Living Family/patient expects to be discharged to:: Assisted living                      Prior Function Level of Independence: Needs assistance   Gait / Transfers Assistance Needed: Pt poor historian; likely short ambulation distance with assist           Hand Dominance        Extremity/Trunk Assessment   Upper Extremity Assessment Upper Extremity Assessment: Generalized weakness    Lower Extremity Assessment Lower Extremity Assessment: Generalized weakness    Cervical / Trunk Assessment Cervical / Trunk Assessment: Kyphotic  Communication   Communication: HOH  Cognition Arousal/Alertness: Awake/alert Behavior During Therapy: Flat affect Overall Cognitive Status: History of cognitive impairments - at baseline                                 General Comments: H/o  dementia. Oriented to self, asking where she is; pleasant and agreeable, following simple verbal/gestural commands; very HOH      General Comments General comments (skin integrity, edema, etc.): SpO2 >90% on RA. Post-transfer BP 107/53    Exercises     Assessment/Plan    PT Assessment Patient needs continued PT services  PT Problem List Decreased strength;Decreased activity tolerance;Decreased balance;Decreased mobility;Decreased knowledge of use of DME;Cardiopulmonary status limiting activity       PT Treatment Interventions DME  instruction;Gait training;Functional mobility training;Therapeutic activities;Therapeutic exercise;Balance training;Patient/family education    PT Goals (Current goals can be found in the Care Plan section)  Acute Rehab PT Goals Patient Stated Goal: None stated PT Goal Formulation: With patient Time For Goal Achievement: 03/25/19    Frequency Min 2X/week   Barriers to discharge        Co-evaluation               AM-PAC PT "6 Clicks" Mobility  Outcome Measure Help needed turning from your back to your side while in a flat bed without using bedrails?: A Little Help needed moving from lying on your back to sitting on the side of a flat bed without using bedrails?: A Lot Help needed moving to and from a bed to a chair (including a wheelchair)?: A Lot Help needed standing up from a chair using your arms (e.g., wheelchair or bedside chair)?: A Lot Help needed to walk in hospital room?: A Little Help needed climbing 3-5 steps with a railing? : A Lot 6 Click Score: 14    End of Session   Activity Tolerance: Patient tolerated treatment well Patient left: in chair;with call bell/phone within reach;with chair alarm set Nurse Communication: Mobility status PT Visit Diagnosis: Other abnormalities of gait and mobility (R26.89);Muscle weakness (generalized) (M62.81)    Time: 6237-6283 PT Time Calculation (min) (ACUTE ONLY): 22 min   Charges:   PT Evaluation $PT Eval Moderate Complexity: [place], PT, DPT Acute Rehabilitation Services  Pager [phone] Office [facility] 03/11/2019, 11:42 AM

## 2019-03-11 NOTE — Progress Notes (Signed)
OT Cancellation Note  Patient Details Name: Yvonne Edwards MRN: [id number] DOB: Mar 15, 1919   Cancelled Treatment:    Reason Eval/Treat Not Completed: Other (comment) ; pt had just returned to bed with nursing staff assist. Will follow up for OT eval as schedule permits.  Lou Cal, OT Supplemental Rehabilitation Services Pager [phone] Office [phone]  Raymondo Band 03/11/2019, 2:47 PM

## 2019-03-11 NOTE — Progress Notes (Signed)
   03/11/19 1400  Family/Significant Other Communication  Family/Significant Other Update Called;Updated (Havoline Arsenio Loader)

## 2019-03-11 NOTE — Progress Notes (Signed)
Attempted to feed patient. She constantly chewed on a few pieces of rice that were soft and in gravy. Took approximately 3 minutes to get that down, drank a few sips of tea and she said she was done and started dozing off. Peri care completed. SCDs placed on patient. Will continue to monitor and report to oncoming nurse.

## 2019-03-12 LAB — CBC WITH DIFFERENTIAL/PLATELET
Abs Immature Granulocytes: 0.04 10*3/uL (ref 0.00–0.07)
Basophils Absolute: 0 10*3/uL (ref 0.0–0.1)
Basophils Relative: 1 %
Eosinophils Absolute: 0.1 10*3/uL (ref 0.0–0.5)
Eosinophils Relative: 1 %
HCT: 44 % (ref 36.0–46.0)
Hemoglobin: 14 g/dL (ref 12.0–15.0)
Immature Granulocytes: 1 %
Lymphocytes Relative: 12 %
Lymphs Abs: 0.7 10*3/uL (ref 0.7–4.0)
MCH: 28.7 pg (ref 26.0–34.0)
MCHC: 31.8 g/dL (ref 30.0–36.0)
MCV: 90.2 fL (ref 80.0–100.0)
Monocytes Absolute: 0.7 10*3/uL (ref 0.1–1.0)
Monocytes Relative: 13 %
Neutro Abs: 4.1 10*3/uL (ref 1.7–7.7)
Neutrophils Relative %: 72 %
Platelets: 229 10*3/uL (ref 150–400)
RBC: 4.88 MIL/uL (ref 3.87–5.11)
RDW: 12.8 % (ref 11.5–15.5)
WBC: 5.7 10*3/uL (ref 4.0–10.5)
nRBC: 0 % (ref 0.0–0.2)

## 2019-03-12 LAB — COMPREHENSIVE METABOLIC PANEL
ALT: 31 U/L (ref 0–44)
AST: 50 U/L — ABNORMAL HIGH (ref 15–41)
Albumin: 2.7 g/dL — ABNORMAL LOW (ref 3.5–5.0)
Alkaline Phosphatase: 70 U/L (ref 38–126)
Anion gap: 10 (ref 5–15)
BUN: 15 mg/dL (ref 8–23)
CO2: 29 mmol/L (ref 22–32)
Calcium: 8.4 mg/dL — ABNORMAL LOW (ref 8.9–10.3)
Chloride: 100 mmol/L (ref 98–111)
Creatinine, Ser: 0.66 mg/dL (ref 0.44–1.00)
GFR calc Af Amer: >60 mL/min (ref >60)
GFR calc non Af Amer: >60 mL/min (ref >60)
Glucose, Bld: 114 mg/dL — ABNORMAL HIGH (ref 70–99)
Potassium: 4.2 mmol/L (ref 3.5–5.1)
Sodium: 139 mmol/L (ref 135–145)
Total Bilirubin: 0.8 mg/dL (ref 0.3–1.2)
Total Protein: 5.8 g/dL — ABNORMAL LOW (ref 6.5–8.1)

## 2019-03-12 LAB — D-DIMER, QUANTITATIVE: D-Dimer, Quant: 0.7 ug/mL-FEU — ABNORMAL HIGH (ref 0.00–0.50)

## 2019-03-12 LAB — C-REACTIVE PROTEIN: CRP: 1.9 mg/dL — ABNORMAL HIGH (ref <1.0)

## 2019-03-12 LAB — PHOSPHORUS: Phosphorus: 3.2 mg/dL (ref 2.5–4.6)

## 2019-03-12 NOTE — Progress Notes (Signed)
Occupational Therapy Evaluation Patient Details Name: Yvonne Edwards MRN: [id number] DOB: 03/06/1919 Today's Date: 03/12/2019    History of Present Illness Pt is a 85 y.o. female admitted from ALF on 03/08/19 with fever, weakness, AMS; workup significant for UTI and (+) COVID-19. Head CT negative for acute findings. PMH includes hearing loss, macular degeneration, dementia.   Clinical Impression   PLOF obtained from chart and RN report as pt is a poor historian. Pt was previously living at an ALF likely requiring assist for self-care, functional transfers, and mobility tasks. Pt currently requires variable min assist to max assist for self-care and functional transfer tasks. Pt able to stand pivot to bedside chair requiring mod assist as well as engage in self-care tasks. No reports of SOB throughout. Pt presents with confusion, decreased ROM, strength, endurance, balance, sitting tolerance, standing tolerance, activity tolerance, and safety awareness impacting ability to complete self-care and functional transfer tasks. Recommend skilled OT services to address above deficits in order to promote function and prevent further decline. Recommend SNF placement for additional rehab if ALF is unable to provide increased level of assist.     Follow Up Recommendations  SNF    Equipment Recommendations       Recommendations for Other Services       Precautions / Restrictions Precautions Precautions: Fall Restrictions Weight Bearing Restrictions: No      Mobility Bed Mobility Overal bed mobility: Needs Assistance Bed Mobility: Supine to Sit     Supine to sit: Max assist     General bed mobility comments: Max assist to initiate and complete. Pt able to scoot towards EOB once upright with min assist.  Transfers Overall transfer level: Needs assistance Equipment used: 1 person hand held assist Transfers: Stand Pivot Transfers Sit to Stand: Mod assist Stand pivot transfers: Mod assist            Balance Overall balance assessment: Needs assistance   Sitting balance-Leahy Scale: Fair       Standing balance-Leahy Scale: Poor                             ADL either performed or assessed with clinical judgement   ADL Overall ADL's : Needs assistance/impaired Eating/Feeding: Minimal assistance   Grooming: Moderate assistance;Sitting   Upper Body Bathing: Maximal assistance;Sitting   Lower Body Bathing: Maximal assistance;Sitting/lateral leans   Upper Body Dressing : Maximal assistance;Sitting   Lower Body Dressing: Maximal assistance;Sit to/from stand   Toilet Transfer: Moderate assistance;Stand-pivot;BSC   Toileting- Clothing Manipulation and Hygiene: Maximal assistance;Sit to/from stand       Functional mobility during ADLs: Moderate assistance(Stand pivot only this date)       Vision Baseline Vision/History: Wears glasses       Perception     Praxis      Pertinent Vitals/Pain Pain Assessment: Faces Faces Pain Scale: No hurt     Hand Dominance Right   Extremity/Trunk Assessment Upper Extremity Assessment Upper Extremity Assessment: Generalized weakness;Difficult to assess due to impaired cognition(PROM RUE WFL. PROM LUE shld flex to ~80 degrees.)   Lower Extremity Assessment Lower Extremity Assessment: Defer to PT evaluation       Communication Communication Communication: HOH   Cognition Arousal/Alertness: Lethargic Behavior During Therapy: Flat affect Overall Cognitive Status: History of cognitive impairments - at baseline  General Comments: H/o dementia. Oriented to self. Pt extremely HOH. Pt able to follow simple one step commands with mod to max visual cues.   General Comments  Educated pt on importance of daily activity, specifically sitting upright in the chair throughout the day. Educated pt on safety strategies for self-care and functional transfer tasks. Pt  required increased cues and time to complete all tasks.     Exercises     Shoulder Instructions      Home Living Family/patient expects to be discharged to:: Assisted living                             Home Equipment: (Unknown. Pt is a poor historian unable to recall)          Prior Functioning/Environment Level of Independence: Needs assistance  Gait / Transfers Assistance Needed: Pt poor historian; likely short ambulation distance with assist. ADL's / Homemaking Assistance Needed: Pt poor historian. Likely required assist with self-care tasks.            OT Problem List: Decreased strength;Decreased range of motion;Decreased activity tolerance;Impaired balance (sitting and/or standing);Decreased safety awareness;Decreased cognition;Cardiopulmonary status limiting activity;Decreased knowledge of use of DME or AE;Decreased knowledge of precautions      OT Treatment/Interventions: Self-care/ADL training;Therapeutic exercise;Neuromuscular education;Energy conservation;DME and/or AE instruction;Therapeutic activities;Patient/family education;Balance training    OT Goals(Current goals can be found in the care plan section) Acute Rehab OT Goals Patient Stated Goal: None stated Time For Goal Achievement: 03/26/19 Potential to Achieve Goals: Fair ADL Goals Pt Will Perform Eating: sitting;with supervision Pt Will Perform Grooming: sitting;with supervision Pt Will Transfer to Toilet: with min assist;bedside commode;stand pivot transfer Pt Will Perform Toileting - Clothing Manipulation and hygiene: with mod assist;sitting/lateral leans;sit to/from stand Additional ADL Goal #1: Pt will tolerate sitting EOB 10 min with supervision and 0 instances of LOB in preparation for ADLs.  OT Frequency: Min 2X/week   Barriers to D/C:            Co-evaluation              AM-PAC OT "6 Clicks" Daily Activity     Outcome Measure Help from another person eating meals?: A  Little Help from another person taking care of personal grooming?: A Lot Help from another person toileting, which includes using toliet, bedpan, or urinal?: A Lot Help from another person bathing (including washing, rinsing, drying)?: A Lot Help from another person to put on and taking off regular upper body clothing?: A Lot Help from another person to put on and taking off regular lower body clothing?: A Lot 6 Click Score: 13   End of Session Equipment Utilized During Treatment: Gait belt Nurse Communication: Mobility status  Activity Tolerance: Patient limited by fatigue;Patient limited by lethargy Patient left: in chair;with call bell/phone within reach;with chair alarm set;with nursing/sitter in room  OT Visit Diagnosis: Unsteadiness on feet (R26.81);Other abnormalities of gait and mobility (R26.89);Muscle weakness (generalized) (M62.81)                Time: 0093-8182 OT Time Calculation (min): 30 min Charges:  OT General Charges $OT Visit: 1 Visit OT Evaluation $OT Eval Moderate Complexity: 1 Mod OT Treatments $Self Care/Home Management : 8-22 mins  Peterson Ao OTR/L [phone]   Peterson Ao 03/12/2019, 9:58 AM

## 2019-03-12 NOTE — Progress Notes (Signed)
PROGRESS NOTE                                                                                                                                                                                                             Patient Demographics:    Yvonne Edwards, is a 85 y.o. female, DOB - Aug 24, 1918, ZOX:[id number]  Admit date - 03/08/2019   Admitting Physician Lorretta Harp, MD  Outpatient Primary MD for the patient is [name], MD  LOS - 3   No chief complaint on file.      Brief Narrative    85 y.o.femalewith medical history significant ofbilateral hearing loss,age-related macular degeneration, hyperlipidemia, chronic diarrheawho was sent to ED byher assisted living facility for concerns of fever, weakness and increased confusion, her work-up was significant for UTI, as well she did test positive for COVID-19 pneumonia, so she was transferred to [facility] for further management.    Subjective:    [name] today significant events overnight as discussed with staff, her oral intake remains poor.    Assessment  & Plan :    Principal Problem:   Acute lower UTI Active Problems:   AMD (age related macular degeneration)   Acute metabolic encephalopathy   Other pneumothorax   COVID-19 virus infection   Chronic diarrhea   UTI (urinary tract infection)   Dementia (HCC)   COVID-19 virus infection -Covid 19+ on admission to [facility]. -So far appears to be symptomatic with no oxygen requirement.  No active cough or respiratory distress. CXR with no infiltration for pneumonia.  -Continue with zinc and vitamin C-Continue to monitor inflammatory markers, CRP is trending down which is reassuring  COVID-19 Labs  Recent Labs    03/10/19 0015 03/11/19 0405 03/12/19 0545  DDIMER 0.88* 0.61* 0.70*  CRP 5.0* 3.1* 1.9*    Lab Results  Component Value Date   SARSCOV2NAA POSITIVE (A) 03/07/2019     Altered mental status:  - acute metabolic  encephalopathy in the setting of infectious process due to UTI and COVID-19 infection. -Likely due to UTI, CT head with no acute findings. -Status appears to be improving, but slowly  Hypophosphatemia -Repleted.  UTI -Culture growing Klebsiella, treated with IV Rocephin  Right 5 mm apical pneumothorax: -  Incidental finding on CT spine. Patient is not hypoxic and not tachypneic on exam. The  repeated CXR is negative for pneumothorax  Hypokalemia:  -Repleted  Chronic diarrhea and mild AP:  - pt is on immodium for this. CT of abdomen is unremarkable. -Checked C.diff on admission, pending results. -Hold Imodium until C diff negative -Hold laxatives   Hyperlipidemia -Continue rosuvastatin  Dementia -Continue mirtazapine  Protein calorie malnutrition: Moderate.  -consulted dietitian.  She remains with poor oral intake, discussed with staff, they are encouraging her to drink Ensure.  Code Status : DNR  Family Communication  : Patient has no allive  family members as discussed with caregiver [name] on 11/21, who has been her friend for last 20 years, and has been supervising her care  Disposition Plan  : Back to SNF  Consults  : Medicine  Procedures  : None  DVT Prophylaxis  :  [place] lovenox  Lab Results  Component Value Date   PLT 229 03/12/2019    Antibiotics  :    Anti-infectives (From admission, onward)   Start     Dose/Rate Route Frequency Ordered Stop   03/08/19 2200  cefTRIAXone (ROCEPHIN) 1 g in sodium chloride 0.9 % 100 mL IVPB     1 g 200 mL/hr over 30 Minutes Intravenous Every 24 hours 03/08/19 1925 03/12/19 0700        Objective:   Vitals:   03/11/19 1648 03/11/19 1944 03/12/19 0428 03/12/19 0900  BP: 138/62 (!) 125/58 (!) 156/55 (!) 128/59  Pulse: 90 81 77 94  Resp: 18   16  Temp: 98.6 F (37 C) 98.4 F (36.9 C) 98.3 F (36.8 C) 98 F (36.7 C)  TempSrc: Axillary Oral Oral Oral  SpO2: 95% 100% 95% 91%  Weight:      Height:         Wt Readings from Last 3 Encounters:  03/09/19 54.4 kg  03/07/19 47 kg  03/05/19 47 kg     Intake/Output Summary (Last 24 hours) at 03/12/2019 1339 Last data filed at 03/12/2019 1300 Gross per 24 hour  Intake 300 ml  Output --  Net 300 ml     Physical Exam  Patient sitting in recliner, extremely frail, awake, extremely hard of hearing, demented Symmetrical Chest wall movement, Good air movement bilaterally, CTAB RRR,No Gallops,Rubs or new Murmurs, No Parasternal Heave +ve B.Sounds, Abd Soft, No tenderness,  No Cyanosis, Clubbing or edema, No new Rash or bruise      Data Review:    CBC Recent Labs  Lab 03/07/19 1526 03/08/19 0553 03/09/19 0210 03/10/19 0015 03/11/19 0405 03/12/19 0545  WBC 7.6 5.5 4.7 4.1 4.1 5.7  HGB 14.9 14.4 14.0 13.1 13.3 14.0  HCT 44.4 43.9 43.1 40.8 41.2 44.0  PLT 202 181 202 212 228 229  MCV 86.0 88.9 90.2 91.3 90.4 90.2  MCH 28.9 29.1 29.3 29.3 29.2 28.7  MCHC 33.6 32.8 32.5 32.1 32.3 31.8  RDW 12.7 12.6 12.6 12.9 12.9 12.8  LYMPHSABS 0.6*  --  0.5* 0.7 0.8 0.7  MONOABS 0.7  --  0.7 0.6 0.7 0.7  EOSABS 0.0  --  0.0 0.0 0.1 0.1  BASOSABS 0.0  --  0.0 0.0 0.0 0.0    Chemistries  Recent Labs  Lab 03/07/19 1526 03/08/19 0553 03/09/19 0210 03/10/19 0015 03/11/19 0405 03/12/19 0545  NA 135 138 138 141 141 139  K 3.3* 3.9 3.8 3.3* 4.0 4.2  CL 95* 100 101 106 105 100  CO2 GLUCOSE 109* 95 97 98 110* 114*  BUN 18 19 19 21 20 15   CREATININE 0.79 0.82 0.70 0.75 0.60 0.66  CALCIUM 9.0 8.6* 8.5* 8.1* 8.2* 8.4*  MG 1.8  --   --   --   --   --   AST 32  --  31 27 33 50*  ALT 21  --  20 19 20 31   ALKPHOS 61  --  66 61 62 70  BILITOT 0.8  --  0.7 0.3 0.8 0.8   ------------------------------------------------------------------------------------------------------------------ No results for input(s): CHOL, HDL, LDLCALC, TRIG, CHOLHDL, LDLDIRECT in the last 72 hours.  Lab Results  Component Value Date   HGBA1C  5.9 (H) 12/05/2016   ------------------------------------------------------------------------------------------------------------------ No results for input(s): TSH, T4TOTAL, T3FREE, THYROIDAB in the last 72 hours.  Invalid input(s): FREET3 ------------------------------------------------------------------------------------------------------------------ No results for input(s): VITAMINB12, FOLATE, FERRITIN, TIBC, IRON, RETICCTPCT in the last 72 hours.  Coagulation profile No results for input(s): INR, PROTIME in the last 168 hours.  Recent Labs    03/11/19 0405 03/12/19 0545  DDIMER 0.61* 0.70*    Cardiac Enzymes No results for input(s): CKMB, TROPONINI, MYOGLOBIN in the last 168 hours.  Invalid input(s): CK ------------------------------------------------------------------------------------------------------------------    Component Value Date/Time   BNP 134.0 (H) 03/08/2019 0553    Inpatient Medications  Scheduled Meds:  feeding supplement (ENSURE ENLIVE)  237 mL Oral TID BM   heparin injection (subcutaneous)  5,000 Units Subcutaneous Q8H   mouth rinse  15 mL Mouth Rinse BID   mirtazapine  7.5 mg Oral QHS   rosuvastatin  10 mg Oral QHS   sodium chloride flush  3 mL Intravenous Q12H   vitamin C  500 mg Oral Daily   zinc sulfate  220 mg Oral Daily   Continuous Infusions:  sodium chloride     PRN Meds:.sodium chloride, acetaminophen, guaiFENesin-dextromethorphan, ondansetron **OR** ondansetron (ZOFRAN) IV, sodium chloride flush  Micro Results Recent Results (from the past 240 hour(s))  Urine culture     Status: Abnormal   Collection Time: 03/07/19  3:26 PM   Specimen: Urine, Catheterized  Result Value Ref Range Status   Specimen Description   Final    URINE, CATHETERIZED Performed at [facility], [address] [place], [place] [address]    Special Requests   Final    NONE Performed at [facility], [address]  [place], [place] [address]    Culture >=100,000 COLONIES/mL KLEBSIELLA PNEUMONIAE (A)  Final   Report Status 03/09/2019 FINAL  Final   Organism ID, Bacteria KLEBSIELLA PNEUMONIAE (A)  Final      Susceptibility   Klebsiella pneumoniae - MIC*    AMPICILLIN >=32 RESISTANT Resistant     CEFAZOLIN <=4 SENSITIVE Sensitive     CEFTRIAXONE <=1 SENSITIVE Sensitive     CIPROFLOXACIN <=0.25 SENSITIVE Sensitive     GENTAMICIN <=1 SENSITIVE Sensitive     IMIPENEM <=0.25 SENSITIVE Sensitive     NITROFURANTOIN 64 INTERMEDIATE Intermediate     TRIMETH/SULFA <=20 SENSITIVE Sensitive     AMPICILLIN/SULBACTAM 4 SENSITIVE Sensitive     PIP/TAZO 8 SENSITIVE Sensitive     Extended ESBL NEGATIVE Sensitive     * >=100,000 COLONIES/mL KLEBSIELLA PNEUMONIAE  SARS CORONAVIRUS 2 (TAT 6-24 HRS) Nasopharyngeal Nasopharyngeal Swab     Status: Abnormal   Collection Time: 03/07/19  6:32 PM   Specimen: Nasopharyngeal Swab  Result Value Ref Range Status   SARS Coronavirus 2 POSITIVE (A) NEGATIVE Final    Comment: RESULT CALLED TO, READ BACK BY AND VERIFIED WITH:  WOODS B, RN AT [phone] ON 03/08/2019 BY SAINVILUS S (NOTE) SARS-CoV-2 target nucleic acids are DETECTED. The SARS-CoV-2 RNA is generally detectable in upper and lower respiratory specimens during the acute phase of infection. Positive results are indicative of active infection with SARS-CoV-2. Clinical  correlation with patient history and other diagnostic information is necessary to determine patient infection status. Positive results do  not rule out bacterial infection or co-infection with other viruses. The expected result is Negative. Fact Sheet for Patients: HairSlick.no Fact Sheet for Healthcare Providers: quierodirigir.com This test is not yet approved or cleared by the [place] FDA and  has been authorized for detection and/or diagnosis of SARS-CoV-2 by FDA under an Emergency Use Authorization  (EUA). This EUA will remain  in effect (meaning this test can b e used) for the duration of the COVID-19 declaration under Section 564(b)(1) of the Act, 21 U.S.C. section 360bbb-3(b)(1), unless the authorization is terminated or revoked sooner. Performed at [facility], [address], [place], [place] [address]     Radiology Reports Ct Head Wo Contrast  Result Date: 03/07/2019 CLINICAL DATA:  85 year old female with altered mental status. EXAM: CT HEAD WITHOUT CONTRAST CT CERVICAL SPINE WITHOUT CONTRAST TECHNIQUE: Multidetector CT imaging of the head and cervical spine was performed following the standard protocol without intravenous contrast. Multiplanar CT image reconstructions of the cervical spine were also generated. COMPARISON:  None. FINDINGS: CT HEAD FINDINGS Brain: Moderate to advanced age-related atrophy and chronic microvascular ischemic changes. There is no acute intracranial hemorrhage. No mass effect or midline shift. No extra-axial fluid collection. Vascular: No hyperdense vessel or unexpected calcification. Skull: Normal. Negative for fracture or focal lesion. Sinuses/Orbits: No acute finding. Other: None CT CERVICAL SPINE FINDINGS Alignment: No acute subluxation. Grade 1 C3-C4 anterolisthesis. Skull base and vertebrae: No acute fracture. Osteopenia. Soft tissues and spinal canal: No prevertebral fluid or swelling. No visible canal hematoma. Disc levels: Multilevel degenerative changes with endplate irregularity and disc space narrowing. Upper chest: Biapical subpleural scarring. There is a 5 mm right apical pneumothorax. This is new compared to the CT of 12/09/2016. Other: Bilateral carotid bulb calcified plaques. IMPRESSION: 1. No acute intracranial hemorrhage. Moderate to advanced age-related atrophy and chronic microvascular ischemic changes. 2. No acute/traumatic cervical spine pathology. Multilevel degenerative changes. 3. A 5 mm right apical pneumothorax. These results  were called by telephone at the time of interpretation on 03/07/2019 at 4:29 pm to provider [facility] , who verbally acknowledged these results. Electronically Signed   By: Elgie Collard M.D.   On: 03/07/2019 16:30   Ct Cervical Spine Wo Contrast  Result Date: 03/07/2019 CLINICAL DATA:  85 year old female with altered mental status. EXAM: CT HEAD WITHOUT CONTRAST CT CERVICAL SPINE WITHOUT CONTRAST TECHNIQUE: Multidetector CT imaging of the head and cervical spine was performed following the standard protocol without intravenous contrast. Multiplanar CT image reconstructions of the cervical spine were also generated. COMPARISON:  None. FINDINGS: CT HEAD FINDINGS Brain: Moderate to advanced age-related atrophy and chronic microvascular ischemic changes. There is no acute intracranial hemorrhage. No mass effect or midline shift. No extra-axial fluid collection. Vascular: No hyperdense vessel or unexpected calcification. Skull: Normal. Negative for fracture or focal lesion. Sinuses/Orbits: No acute finding. Other: None CT CERVICAL SPINE FINDINGS Alignment: No acute subluxation. Grade 1 C3-C4 anterolisthesis. Skull base and vertebrae: No acute fracture. Osteopenia. Soft tissues and spinal canal: No prevertebral fluid or swelling. No visible canal hematoma. Disc levels: Multilevel degenerative changes with endplate irregularity and disc  space narrowing. Upper chest: Biapical subpleural scarring. There is a 5 mm right apical pneumothorax. This is new compared to the CT of 12/09/2016. Other: Bilateral carotid bulb calcified plaques. IMPRESSION: 1. No acute intracranial hemorrhage. Moderate to advanced age-related atrophy and chronic microvascular ischemic changes. 2. No acute/traumatic cervical spine pathology. Multilevel degenerative changes. 3. A 5 mm right apical pneumothorax. These results were called by telephone at the time of interpretation on 03/07/2019 at 4:29 pm to provider [facility] , who verbally  acknowledged these results. Electronically Signed   By: Anner Crete M.D.   On: 03/07/2019 16:30   Ct Abdomen Pelvis W Contrast  Result Date: 03/05/2019 CLINICAL DATA:  Upper abdominal pain, diarrhea, decreased appetite EXAM: CT ABDOMEN AND PELVIS WITH CONTRAST TECHNIQUE: Multidetector CT imaging of the abdomen and pelvis was performed using the standard protocol following bolus administration of intravenous contrast. CONTRAST:  22mL OMNIPAQUE IOHEXOL 300 MG/ML  SOLN COMPARISON:  12/20/2012 FINDINGS: Technical note: Examination is degraded by patient motion artifact. Additional metallic streak/beam hardening artifact from right total hip arthroplasty hardware degrades evaluation of the deep pelvis. Lower chest: Trace right pleural effusion with right basilar atelectasis and small dependent areas of airspace consolidation. Left lung base clear. Coronary artery calcifications. Hepatobiliary: No focal liver abnormality is seen. Status post cholecystectomy. No biliary dilatation. Pancreas: Grossly unremarkable. Spleen: Normal in size without focal abnormality. Adrenals/Urinary Tract: Adrenal glands and bilateral kidneys appear grossly unremarkable. Urinary bladder is partially obscured by artifact. No discrete abnormality. Stomach/Bowel: Negative for bowel obstruction. Small volume liquid stool within bowel suggesting diarrheal state. No definite bowel thickening or inflammatory changes are evident within the limitations of this exam. Vascular/Lymphatic: Aortic atherosclerosis. No enlarged abdominal or pelvic lymph nodes. Reproductive: No appreciable abnormality. No adnexal mass. Other: No ascites. No abdominal wall hernia. Musculoskeletal: Partially visualized right total hip arthroplasty hardware. Degenerative disc disease of the lumbar spine. There is a dextroscoliotic curvature of the lumbar spine. No acute osseous findings are evident. IMPRESSION: 1. Somewhat limited exam.  No acute abdominopelvic  findings. 2. Trace right pleural effusion with right basilar atelectasis and small dependent areas of airspace consolidation. 3. Status post cholecystectomy. 4. Aortic atherosclerosis and coronary artery calcifications. Electronically Signed   By: Davina Poke M.D.   On: 03/05/2019 17:17   Dg Chest Port 1 View  Result Date: 03/08/2019 CLINICAL DATA:  COVID positive, evaluate for pneumothorax EXAM: PORTABLE CHEST 1 VIEW COMPARISON:  2018 FINDINGS: Chronic mild interstitial prominence. No pleural effusion or pneumothorax. Stable cardiomediastinal contours with normal heart size. Calcified plaque along the thoracic aorta. IMPRESSION: No pneumothorax. Electronically Signed   By: Macy Mis M.D.   On: 03/08/2019 08:35   Dg Chest Portable 1 View  Result Date: 03/07/2019 CLINICAL DATA:  Fever and weakness. EXAM: PORTABLE CHEST 1 VIEW COMPARISON:  Chest x-ray dated December 09, 2016. FINDINGS: The heart size and mediastinal contours are within normal limits. Atherosclerotic calcification of the thoracic aorta. Normal pulmonary vascularity. No focal consolidation, pleural effusion, or pneumothorax. No acute osseous abnormality. IMPRESSION: No active disease. Electronically Signed   By: Titus Dubin M.D.   On: 03/07/2019 15:59      Phillips Climes M.D on 03/12/2019 at 1:39 PM  Between 7am to 7pm - Pager - [phone]  After 7pm go to [email or web address] - password [facility]  [facility] -  Office  [phone]

## 2019-03-12 NOTE — Progress Notes (Signed)
   03/12/19 1603  Family/Significant Other Communication  Family/Significant Other Update Called;Updated (H[place]oline, friend, [phone])

## 2019-03-13 DIAGNOSIS — R627 Adult failure to thrive: Secondary | ICD-10-CM

## 2019-03-13 LAB — D-DIMER, QUANTITATIVE: D-Dimer, Quant: 0.3 ug/mL-FEU (ref 0.00–0.50)

## 2019-03-13 LAB — COMPREHENSIVE METABOLIC PANEL
ALT: 32 U/L (ref 0–44)
AST: 55 U/L — ABNORMAL HIGH (ref 15–41)
Albumin: 2.5 g/dL — ABNORMAL LOW (ref 3.5–5.0)
Alkaline Phosphatase: 77 U/L (ref 38–126)
Anion gap: 10 (ref 5–15)
BUN: 15 mg/dL (ref 8–23)
CO2: 30 mmol/L (ref 22–32)
Calcium: 8.6 mg/dL — ABNORMAL LOW (ref 8.9–10.3)
Chloride: 99 mmol/L (ref 98–111)
Creatinine, Ser: 0.74 mg/dL (ref 0.44–1.00)
GFR calc Af Amer: >60 mL/min (ref >60)
GFR calc non Af Amer: >60 mL/min (ref >60)
Glucose, Bld: 90 mg/dL (ref 70–99)
Potassium: 4.8 mmol/L (ref 3.5–5.1)
Sodium: 139 mmol/L (ref 135–145)
Total Bilirubin: 0.5 mg/dL (ref 0.3–1.2)
Total Protein: 5.6 g/dL — ABNORMAL LOW (ref 6.5–8.1)

## 2019-03-13 LAB — CBC WITH DIFFERENTIAL/PLATELET
Abs Immature Granulocytes: 0.03 10*3/uL (ref 0.00–0.07)
Basophils Absolute: 0.1 10*3/uL (ref 0.0–0.1)
Basophils Relative: 1 %
Eosinophils Absolute: 0.2 10*3/uL (ref 0.0–0.5)
Eosinophils Relative: 3 %
HCT: 41.9 % (ref 36.0–46.0)
Hemoglobin: 13.4 g/dL (ref 12.0–15.0)
Immature Granulocytes: 1 %
Lymphocytes Relative: 22 %
Lymphs Abs: 1.2 10*3/uL (ref 0.7–4.0)
MCH: 29.1 pg (ref 26.0–34.0)
MCHC: 32 g/dL (ref 30.0–36.0)
MCV: 91.1 fL (ref 80.0–100.0)
Monocytes Absolute: 0.9 10*3/uL (ref 0.1–1.0)
Monocytes Relative: 16 %
Neutro Abs: 3.2 10*3/uL (ref 1.7–7.7)
Neutrophils Relative %: 57 %
Platelets: 245 10*3/uL (ref 150–400)
RBC: 4.6 MIL/uL (ref 3.87–5.11)
RDW: 12.9 % (ref 11.5–15.5)
WBC: 5.5 10*3/uL (ref 4.0–10.5)
nRBC: 0 % (ref 0.0–0.2)

## 2019-03-13 LAB — PHOSPHORUS: Phosphorus: 3 mg/dL (ref 2.5–4.6)

## 2019-03-13 LAB — C-REACTIVE PROTEIN: CRP: 1.7 mg/dL — ABNORMAL HIGH (ref <1.0)

## 2019-03-13 NOTE — Progress Notes (Signed)
PROGRESS NOTE                                                                                                                                                                                                             Patient Demographics:    Yvonne Edwards, is a 85 y.o. female, DOB - [birth year], YQM:[id number]  Admit date - 03/08/2019   Admitting Physician Ivor Costa, MD  Outpatient Primary MD for the patient is Tracie Harrier, MD  LOS - 4   No chief complaint on file.      Brief Narrative    85 y.o.femalewith medical history significant ofbilateral hearing loss,age-related macular degeneration, hyperlipidemia, chronic diarrheawho was sent to ED byher assisted living facility for concerns of fever, weakness and increased confusion, her work-up was significant for UTI, as well she did test positive for COVID-19 pneumonia, so she was transferred to [facility] for further management.    Subjective:    [name] today with no significant events overnight as discussed with staff, her oral intake remains extremely poor .   Assessment  & Plan :    Principal Problem:   Acute lower UTI Active Problems:   AMD (age related macular degeneration)   Acute metabolic encephalopathy   Other pneumothorax   COVID-19 virus infection   Chronic diarrhea   UTI (urinary tract infection)   Dementia ([place])   COVID-19 virus infection -Covid 19+ on admission to [facility]. -So far appears to be symptomatic with no oxygen requirement.  No active cough or respiratory distress. CXR with no infiltration for pneumonia.  -Continue with zinc and vitamin C-Continue to monitor inflammatory markers, CRP is trending down which is reassuring  COVID-19 Labs  Recent Labs    03/11/19 0405 03/12/19 0545 03/13/19 0135  DDIMER 0.61* 0.70* 0.30  CRP 3.1* 1.9* 1.7*    Lab Results  Component Value Date   SARSCOV2NAA POSITIVE (A) 03/07/2019     Altered mental status:  -  acute metabolic encephalopathy in the setting of infectious process due to UTI and COVID-19 infection. -Likely due to UTI, CT head with no acute findings. -Status appears to be improving, but slowly, her mental status is difficult to assess, as she is extremely hard of hearing, and speak with very soft sound, baseline alert x1.  Hypophosphatemia -Repleted.  UTI -Culture growing Klebsiella, treated  with IV Rocephin  Right 5 mm apical pneumothorax: -  Incidental finding on CT spine. Patient is not hypoxic and not tachypneic on exam. The repeated CXR is negative for pneumothorax  Hypokalemia:  -Repleted  Chronic diarrhea and mild AP:  - pt is on immodium for this. CT of abdomen is unremarkable. -Checked C.diff on admission, pending results. -Hold Imodium until C diff negative -Hold laxatives   Hyperlipidemia -Continue rosuvastatin  Dementia -Continue mirtazapine  Protein calorie malnutrition: Moderate. /Failure to thrive -consulted dietitian.  her oral intake remains very poor, would not eat any of her food, she barely tolerated few sips of supplements.  And pushes it out states, that she is not hungry -Have requested palliative care consult to address goals of care and placement, I have discussed with her long-term friend/caregiver [name]  about palliative consult to address placement issue, goals of care.  Code Status : DNR  Family Communication  : Patient has no alive  family members as discussed with caregiver [name], she was updated today.  Disposition Plan  : Unclear, as there is a strong possibility she might need hospice, and she is from [facility], and likely will be able to handle her, will await palliative care consult, discussion with family, then will determine  Consults  : Medicine  Procedures  : None  DVT Prophylaxis  :  [place] lovenox  Lab Results  Component Value Date   PLT 245 03/13/2019    Antibiotics  :     Anti-infectives (From admission, onward)   Start     Dose/Rate Route Frequency Ordered Stop   03/08/19 2200  cefTRIAXone (ROCEPHIN) 1 g in sodium chloride 0.9 % 100 mL IVPB     1 g 200 mL/hr over 30 Minutes Intravenous Every 24 hours 03/08/19 1925 03/12/19 0700        Objective:   Vitals:   03/12/19 1527 03/12/19 1941 03/13/19 0352 03/13/19 0721  BP: 132/62 (!) 147/92 (!) 154/71 (!) 163/68  Pulse: 88 80 84 84  Resp: 18   18  Temp: (!) 97.4 F (36.3 C) 98.7 F (37.1 C) 98.1 F (36.7 C) 98.3 F (36.8 C)  TempSrc: Axillary Axillary Axillary Axillary  SpO2: 93% 95% 93% 93%  Weight:      Height:        Wt Readings from Last 3 Encounters:  03/09/19 54.4 kg  03/07/19 47 kg  03/05/19 47 kg     Intake/Output Summary (Last 24 hours) at 03/13/2019 1409 Last data filed at 03/13/2019 0931 Gross per 24 hour  Intake 180 ml  Output 100 ml  Net 80 ml     Physical Exam  Patient sitting in recliner, extremely frail, awake, extremely hard of hearing, demented Symmetrical Chest wall movement, Good air movement bilaterally, CTAB RRR,No Gallops,Rubs or new Murmurs, No Parasternal Heave +ve B.Sounds, Abd Soft, No tenderness, No rebound - guarding or rigidity. No Cyanosis, Clubbing or edema, No new Rash or bruise       Data Review:    CBC Recent Labs  Lab 03/09/19 0210 03/10/19 0015 03/11/19 0405 03/12/19 0545 03/13/19 0135  WBC 4.7 4.1 4.1 5.7 5.5  HGB 14.0 13.1 13.3 14.0 13.4  HCT 43.1 40.8 41.2 44.0 41.9  PLT 202 212 228 229 245  MCV 90.2 91.3 90.4 90.2 91.1  MCH 29.3 29.3 29.2 28.7 29.1  MCHC 32.5 32.1 32.3 31.8 32.0  RDW 12.6 12.9 12.9 12.8 12.9  LYMPHSABS 0.5* 0.7 0.8 0.7 1.2  MONOABS 0.7 0.6 0.7 0.7 0.9  EOSABS 0.0 0.0 0.1 0.1 0.2  BASOSABS 0.0 0.0 0.0 0.0 0.1    Chemistries  Recent Labs  Lab 03/07/19 1526  03/09/19 0210 03/10/19 0015 03/11/19 0405 03/12/19 0545 03/13/19 0135  NA 135   < > 138 141 141 139 139  K 3.3*   < > 3.8 3.3* 4.0 4.2 4.8   CL 95*   < > 101 106 105 100 99  CO2 27   < > 26 26 29 29 30   GLUCOSE 109*   < > 97 98 110* 114* 90  BUN 18   < > 19 21 20 15 15   CREATININE 0.79   < > 0.70 0.75 0.60 0.66 0.74  CALCIUM 9.0   < > 8.5* 8.1* 8.2* 8.4* 8.6*  MG 1.8  --   --   --   --   --   --   AST 32  --  31 27 33 50* 55*  ALT 21  --  20 19 20 31  32  ALKPHOS 61  --  66 61 62 70 77  BILITOT 0.8  --  0.7 0.3 0.8 0.8 0.5   < > = values in this interval not displayed.   ------------------------------------------------------------------------------------------------------------------ No results for input(s): CHOL, HDL, LDLCALC, TRIG, CHOLHDL, LDLDIRECT in the last 72 hours.  Lab Results  Component Value Date   HGBA1C 5.9 (H) 12/05/2016   ------------------------------------------------------------------------------------------------------------------ No results for input(s): TSH, T4TOTAL, T3FREE, THYROIDAB in the last 72 hours.  Invalid input(s): FREET3 ------------------------------------------------------------------------------------------------------------------ No results for input(s): VITAMINB12, FOLATE, FERRITIN, TIBC, IRON, RETICCTPCT in the last 72 hours.  Coagulation profile No results for input(s): INR, PROTIME in the last 168 hours.  Recent Labs    03/12/19 0545 03/13/19 0135  DDIMER 0.70* 0.30    Cardiac Enzymes No results for input(s): CKMB, TROPONINI, MYOGLOBIN in the last 168 hours.  Invalid input(s): CK ------------------------------------------------------------------------------------------------------------------    Component Value Date/Time   BNP 134.0 (H) 03/08/2019 0553    Inpatient Medications  Scheduled Meds: . feeding supplement (ENSURE ENLIVE)  237 mL Oral TID BM  . heparin injection (subcutaneous)  5,000 Units Subcutaneous Q8H  . mouth rinse  15 mL Mouth Rinse BID  . mirtazapine  7.5 mg Oral QHS  . rosuvastatin  10 mg Oral QHS  . sodium chloride flush  3 mL Intravenous  Q12H  . vitamin C  500 mg Oral Daily  . zinc sulfate  220 mg Oral Daily   Continuous Infusions: . sodium chloride     PRN Meds:.sodium chloride, acetaminophen, guaiFENesin-dextromethorphan, ondansetron **OR** ondansetron (ZOFRAN) IV, sodium chloride flush  Micro Results Recent Results (from the past 240 hour(s))  Urine culture     Status: Abnormal   Collection Time: 03/07/19  3:26 PM   Specimen: Urine, Catheterized  Result Value Ref Range Status   Specimen Description   Final    URINE, CATHETERIZED Performed at [facility], [address] [place], [facility] [address]    Special Requests   Final    NONE Performed at [facility], [address] [place], [facility] [address]    Culture >=100,000 COLONIES/mL KLEBSIELLA PNEUMONIAE (A)  Final   Report Status 03/09/2019 FINAL  Final   Organism ID, Bacteria KLEBSIELLA PNEUMONIAE (A)  Final      Susceptibility   Klebsiella pneumoniae - MIC*    AMPICILLIN >=32 RESISTANT Resistant     CEFAZOLIN <=4 SENSITIVE Sensitive     CEFTRIAXONE <=  1 SENSITIVE Sensitive     CIPROFLOXACIN <=0.25 SENSITIVE Sensitive     GENTAMICIN <=1 SENSITIVE Sensitive     IMIPENEM <=0.25 SENSITIVE Sensitive     NITROFURANTOIN 64 INTERMEDIATE Intermediate     TRIMETH/SULFA <=20 SENSITIVE Sensitive     AMPICILLIN/SULBACTAM 4 SENSITIVE Sensitive     PIP/TAZO 8 SENSITIVE Sensitive     Extended ESBL NEGATIVE Sensitive     * >=100,000 COLONIES/mL KLEBSIELLA PNEUMONIAE  SARS CORONAVIRUS 2 (TAT 6-24 HRS) Nasopharyngeal Nasopharyngeal Swab     Status: Abnormal   Collection Time: 03/07/19  6:32 PM   Specimen: Nasopharyngeal Swab  Result Value Ref Range Status   SARS Coronavirus 2 POSITIVE (A) NEGATIVE Final    Comment: RESULT CALLED TO, READ BACK BY AND VERIFIED WITH: WOODS B, RN AT 0348 ON 03/08/2019 BY SAINVILUS S (NOTE) SARS-CoV-2 target nucleic acids are DETECTED. The SARS-CoV-2 RNA is generally detectable in upper and lower respiratory  specimens during the acute phase of infection. Positive results are indicative of active infection with SARS-CoV-2. Clinical  correlation with patient history and other diagnostic information is necessary to determine patient infection status. Positive results do  not rule out bacterial infection or co-infection with other viruses. The expected result is Negative. Fact Sheet for Patients: HairSlick.no Fact Sheet for Healthcare Providers: quierodirigir.com This test is not yet approved or cleared by the [place] FDA and  has been authorized for detection and/or diagnosis of SARS-CoV-2 by FDA under an Emergency Use Authorization (EUA). This EUA will remain  in effect (meaning this test can b e used) for the duration of the COVID-19 declaration under Section 564(b)(1) of the Act, 21 U.S.C. section 360bbb-3(b)(1), unless the authorization is terminated or revoked sooner. Performed at [facility], [address], [place], [place] [address]     Radiology Reports Ct Head Wo Contrast  Result Date: 03/07/2019 CLINICAL DATA:  85 year old female with altered mental status. EXAM: CT HEAD WITHOUT CONTRAST CT CERVICAL SPINE WITHOUT CONTRAST TECHNIQUE: Multidetector CT imaging of the head and cervical spine was performed following the standard protocol without intravenous contrast. Multiplanar CT image reconstructions of the cervical spine were also generated. COMPARISON:  None. FINDINGS: CT HEAD FINDINGS Brain: Moderate to advanced age-related atrophy and chronic microvascular ischemic changes. There is no acute intracranial hemorrhage. No mass effect or midline shift. No extra-axial fluid collection. Vascular: No hyperdense vessel or unexpected calcification. Skull: Normal. Negative for fracture or focal lesion. Sinuses/Orbits: No acute finding. Other: None CT CERVICAL SPINE FINDINGS Alignment: No acute subluxation. Grade 1 C3-C4  anterolisthesis. Skull base and vertebrae: No acute fracture. Osteopenia. Soft tissues and spinal canal: No prevertebral fluid or swelling. No visible canal hematoma. Disc levels: Multilevel degenerative changes with endplate irregularity and disc space narrowing. Upper chest: Biapical subpleural scarring. There is a 5 mm right apical pneumothorax. This is new compared to the CT of 12/09/2016. Other: Bilateral carotid bulb calcified plaques. IMPRESSION: 1. No acute intracranial hemorrhage. Moderate to advanced age-related atrophy and chronic microvascular ischemic changes. 2. No acute/traumatic cervical spine pathology. Multilevel degenerative changes. 3. A 5 mm right apical pneumothorax. These results were called by telephone at the time of interpretation on 03/07/2019 at 4:29 pm to provider [facility] , who verbally acknowledged these results. Electronically Signed   By: Elgie Collard M.D.   On: 03/07/2019 16:30   Ct Cervical Spine Wo Contrast  Result Date: 03/07/2019 CLINICAL DATA:  85 year old female with altered mental status. EXAM: CT HEAD WITHOUT CONTRAST CT  CERVICAL SPINE WITHOUT CONTRAST TECHNIQUE: Multidetector CT imaging of the head and cervical spine was performed following the standard protocol without intravenous contrast. Multiplanar CT image reconstructions of the cervical spine were also generated. COMPARISON:  None. FINDINGS: CT HEAD FINDINGS Brain: Moderate to advanced age-related atrophy and chronic microvascular ischemic changes. There is no acute intracranial hemorrhage. No mass effect or midline shift. No extra-axial fluid collection. Vascular: No hyperdense vessel or unexpected calcification. Skull: Normal. Negative for fracture or focal lesion. Sinuses/Orbits: No acute finding. Other: None CT CERVICAL SPINE FINDINGS Alignment: No acute subluxation. Grade 1 C3-C4 anterolisthesis. Skull base and vertebrae: No acute fracture. Osteopenia. Soft tissues and spinal canal: No prevertebral  fluid or swelling. No visible canal hematoma. Disc levels: Multilevel degenerative changes with endplate irregularity and disc space narrowing. Upper chest: Biapical subpleural scarring. There is a 5 mm right apical pneumothorax. This is new compared to the CT of 12/09/2016. Other: Bilateral carotid bulb calcified plaques. IMPRESSION: 1. No acute intracranial hemorrhage. Moderate to advanced age-related atrophy and chronic microvascular ischemic changes. 2. No acute/traumatic cervical spine pathology. Multilevel degenerative changes. 3. A 5 mm right apical pneumothorax. These results were called by telephone at the time of interpretation on 03/07/2019 at 4:29 pm to provider [facility] , who verbally acknowledged these results. Electronically Signed   By: Elgie Collard M.D.   On: 03/07/2019 16:30   Ct Abdomen Pelvis W Contrast  Result Date: 03/05/2019 CLINICAL DATA:  Upper abdominal pain, diarrhea, decreased appetite EXAM: CT ABDOMEN AND PELVIS WITH CONTRAST TECHNIQUE: Multidetector CT imaging of the abdomen and pelvis was performed using the standard protocol following bolus administration of intravenous contrast. CONTRAST:  75mL OMNIPAQUE IOHEXOL 300 MG/ML  SOLN COMPARISON:  12/20/2012 FINDINGS: Technical note: Examination is degraded by patient motion artifact. Additional metallic streak/beam hardening artifact from right total hip arthroplasty hardware degrades evaluation of the deep pelvis. Lower chest: Trace right pleural effusion with right basilar atelectasis and small dependent areas of airspace consolidation. Left lung base clear. Coronary artery calcifications. Hepatobiliary: No focal liver abnormality is seen. Status post cholecystectomy. No biliary dilatation. Pancreas: Grossly unremarkable. Spleen: Normal in size without focal abnormality. Adrenals/Urinary Tract: Adrenal glands and bilateral kidneys appear grossly unremarkable. Urinary bladder is partially obscured by artifact. No discrete  abnormality. Stomach/Bowel: Negative for bowel obstruction. Small volume liquid stool within bowel suggesting diarrheal state. No definite bowel thickening or inflammatory changes are evident within the limitations of this exam. Vascular/Lymphatic: Aortic atherosclerosis. No enlarged abdominal or pelvic lymph nodes. Reproductive: No appreciable abnormality. No adnexal mass. Other: No ascites. No abdominal wall hernia. Musculoskeletal: Partially visualized right total hip arthroplasty hardware. Degenerative disc disease of the lumbar spine. There is a dextroscoliotic curvature of the lumbar spine. No acute osseous findings are evident. IMPRESSION: 1. Somewhat limited exam.  No acute abdominopelvic findings. 2. Trace right pleural effusion with right basilar atelectasis and small dependent areas of airspace consolidation. 3. Status post cholecystectomy. 4. Aortic atherosclerosis and coronary artery calcifications. Electronically Signed   By: Duanne Guess M.D.   On: 03/05/2019 17:17   Dg Chest Port 1 View  Result Date: 03/08/2019 CLINICAL DATA:  COVID positive, evaluate for pneumothorax EXAM: PORTABLE CHEST 1 VIEW COMPARISON:  2018 FINDINGS: Chronic mild interstitial prominence. No pleural effusion or pneumothorax. Stable cardiomediastinal contours with normal heart size. Calcified plaque along the thoracic aorta. IMPRESSION: No pneumothorax. Electronically Signed   By: Guadlupe Spanish M.D.   On: 03/08/2019 08:35   Dg Chest Portable 1 View  Result Date: 03/07/2019 CLINICAL DATA:  Fever and weakness. EXAM: PORTABLE CHEST 1 VIEW COMPARISON:  Chest x-ray dated December 09, 2016. FINDINGS: The heart size and mediastinal contours are within normal limits. Atherosclerotic calcification of the thoracic aorta. Normal pulmonary vascularity. No focal consolidation, pleural effusion, or pneumothorax. No acute osseous abnormality. IMPRESSION: No active disease. Electronically Signed   By: Obie DredgeWilliam T Derry M.D.   On:  03/07/2019 15:59      Huey Bienenstockawood Elgergawy M.D on 03/13/2019 at 2:09 PM  Between 7am to 7pm - Pager - [phone]  After 7pm go to [email or web address] - password [email or web address]  [facility] -  Office  [phone]

## 2019-03-13 NOTE — Plan of Care (Signed)
Spoke with friend to update on POC and patient status.  Informed that patient is basically only drinking fluids and only 25% or so, of what we offer.  Patient will occasionally push the cup away and say, "I'm just not hungry anymore."  Friend understands it patient may need palliative/hospice care at this point - but still acknowledges the difficulty of not being able to be at her side.    No further questions at this time.

## 2019-03-14 NOTE — Plan of Care (Signed)
Patient remains TTL feeder.  Appetite is poor  - eating only 1-2 bites solid food and about 25% fluids offered.  Patient consistently informs, " I'm just not very hungry."  Dr. And patient contact aware.

## 2019-03-14 NOTE — Progress Notes (Signed)
PROGRESS NOTE                                                                                                                                                                                                             Patient Demographics:    Yvonne Edwards, is a 85 y.o. female, DOB - 10-27-1918, AVW:[id number]  Admit date - 03/08/2019   Admitting Physician Lorretta Harp, MD  Outpatient Primary MD for the patient is Yvonne Reichmann, MD  LOS - 5   No chief complaint on file.      Brief Narrative    85 y.o.femalewith medical history significant ofbilateral hearing loss,age-related macular degeneration, hyperlipidemia, chronic diarrheawho was sent to ED byher assisted living facility for concerns of fever, weakness and increased confusion, her work-up was significant for UTI, as well she did test positive for COVID-19 pneumonia, so she was transferred to [facility] for further management.    Subjective:    Yvonne Edwards today herself unable to provide any complaints, no significant events overnight as discussed with staff, oral intake remains poor .   Assessment  & Plan :    Principal Problem:   Acute lower UTI Active Problems:   AMD (age related macular degeneration)   Acute metabolic encephalopathy   Other pneumothorax   COVID-19 virus infection   Chronic diarrhea   UTI (urinary tract infection)   Dementia (HCC)   COVID-19 virus infection -Covid 19+ on admission to [facility]. -So far appears to be symptomatic with no oxygen requirement.  No active cough or respiratory distress. CXR with no infiltration for pneumonia.  -Continue with zinc and vitamin C-Continue to monitor inflammatory markers, CRP is trending down which is reassuring.  COVID-19 Labs  Recent Labs    03/12/19 0545 03/13/19 0135  DDIMER 0.70* 0.30  CRP 1.9* 1.7*    Lab Results  Component Value Date   SARSCOV2NAA POSITIVE (A) 03/07/2019     Altered mental status:  -  acute metabolic encephalopathy in the setting of infectious process due to UTI and COVID-19 infection. -Likely due to UTI, CT head with no acute findings. -Status appears to be improving, but slowly, her mental status is difficult to assess, as she is extremely hard of hearing, and speak with very soft sound, baseline alert x1.  Hypophosphatemia -Repleted.  UTI -Culture growing Klebsiella, treated with  IV Rocephin  Right 5 mm apical pneumothorax: -  Incidental finding on CT spine. Patient is not hypoxic and not tachypneic on exam. The repeated CXR is negative for pneumothorax  Hypokalemia:  -Repleted  Chronic diarrhea and mild AP:  - pt is on immodium for this. CT of abdomen is unremarkable. -Checked C.diff on admission, pending results. -Hold Imodium until C diff negative -Hold laxatives   Hyperlipidemia -Continue rosuvastatin  Dementia -Continue mirtazapine  Protein calorie malnutrition: Moderate. /Failure to thrive -consulted dietitian.  her oral intake remains very poor, would not eat any of her food, she barely tolerated few sips of supplements.  And pushes it out states, that she is not hungry -Have requested palliative care consult to address goals of care and placement, I have discussed with her long-term friend/caregiver Vita Barley  about palliative consult to address placement issueS, AND goals of care.  Code Status : DNR  Family Communication  : Patient has no alive  family members as discussed with caregiver Vita Barley, she was updated today.  Disposition Plan  : Unclear, as there is a strong possibility she might need hospice, and she is from [facility] assisted [facility], and likely will be able to handle her, will await palliative care consult, discussion with family, then will determine  Consults  : Medicine  Procedures  : None  DVT Prophylaxis  :  [place] lovenox  Lab Results  Component Value Date   PLT 245 03/13/2019    Antibiotics  :     Anti-infectives (From admission, onward)   Start     Dose/Rate Route Frequency Ordered Stop   03/08/19 2200  cefTRIAXone (ROCEPHIN) 1 g in sodium chloride 0.9 % 100 mL IVPB     1 g 200 mL/hr over 30 Minutes Intravenous Every 24 hours 03/08/19 1925 03/12/19 0700        Objective:   Vitals:   03/13/19 1600 03/13/19 1954 03/14/19 0413 03/14/19 0806  BP: (!) 142/60 140/62 (!) 144/69 117/67  Pulse: 80 82 84 81  Resp: 16   20  Temp: 98.7 F (37.1 C) 98.4 F (36.9 C) 98 F (36.7 C) 97.6 F (36.4 C)  TempSrc: Oral Axillary Axillary Oral  SpO2: 92% 94% 94% 96%  Weight:      Height:        Wt Readings from Last 3 Encounters:  03/09/19 54.4 kg  03/07/19 47 kg  03/05/19 47 kg     Intake/Output Summary (Last 24 hours) at 03/14/2019 1510 Last data filed at 03/14/2019 1016 Gross per 24 hour  Intake -  Output 950 ml  Net -950 ml     Physical Exam  Awake, laying in bed comfortably, in no apparent distress, does not answer any questions appropriately, extremely hard of hearing Symmetrical Chest wall movement, Good air movement bilaterally, CTAB RRR,No Gallops,Rubs or new Murmurs, No Parasternal Heave +ve B.Sounds, Abd Soft, No tenderness, No rebound - guarding or rigidity. No Cyanosis, Clubbing or edema, No new Rash or bruise        Data Review:    CBC Recent Labs  Lab 03/09/19 0210 03/10/19 0015 03/11/19 0405 03/12/19 0545 03/13/19 0135  WBC 4.7 4.1 4.1 5.7 5.5  HGB 14.0 13.1 13.3 14.0 13.4  HCT 43.1 40.8 41.2 44.0 41.9  PLT 202 212 228 229 245  MCV 90.2 91.3 90.4 90.2 91.1  MCH 29.3 29.3 29.2 28.7 29.1  MCHC 32.5 32.1 32.3 31.8 32.0  RDW 12.6 12.9 12.9 12.8 12.9  LYMPHSABS  0.5* 0.7 0.8 0.7 1.2  MONOABS 0.7 0.6 0.7 0.7 0.9  EOSABS 0.0 0.0 0.1 0.1 0.2  BASOSABS 0.0 0.0 0.0 0.0 0.1    Chemistries  Recent Labs  Lab 03/07/19 1526  03/09/19 0210 03/10/19 0015 03/11/19 0405 03/12/19 0545 03/13/19 0135  NA 135   < > 138 141 141 139 139  K 3.3*   < >  3.8 3.3* 4.0 4.2 4.8  CL 95*   < > 101 106 105 100 99  CO2 27   < > 26 26 29 29 30   GLUCOSE 109*   < > 97 98 110* 114* 90  BUN 18   < > 19 21 20 15 15   CREATININE 0.79   < > 0.70 0.75 0.60 0.66 0.74  CALCIUM 9.0   < > 8.5* 8.1* 8.2* 8.4* 8.6*  MG 1.8  --   --   --   --   --   --   AST 32  --  31 27 33 50* 55*  ALT 21  --  20 19 20 31  32  ALKPHOS 61  --  66 61 62 70 77  BILITOT 0.8  --  0.7 0.3 0.8 0.8 0.5   < > = values in this interval not displayed.   ------------------------------------------------------------------------------------------------------------------ No results for input(s): CHOL, HDL, LDLCALC, TRIG, CHOLHDL, LDLDIRECT in the last 72 hours.  Lab Results  Component Value Date   HGBA1C 5.9 (H) 12/05/2016   ------------------------------------------------------------------------------------------------------------------ No results for input(s): TSH, T4TOTAL, T3FREE, THYROIDAB in the last 72 hours.  Invalid input(s): FREET3 ------------------------------------------------------------------------------------------------------------------ No results for input(s): VITAMINB12, FOLATE, FERRITIN, TIBC, IRON, RETICCTPCT in the last 72 hours.  Coagulation profile No results for input(s): INR, PROTIME in the last 168 hours.  Recent Labs    03/12/19 0545 03/13/19 0135  DDIMER 0.70* 0.30    Cardiac Enzymes No results for input(s): CKMB, TROPONINI, MYOGLOBIN in the last 168 hours.  Invalid input(s): CK ------------------------------------------------------------------------------------------------------------------    Component Value Date/Time   BNP 134.0 (H) 03/08/2019 0553    Inpatient Medications  Scheduled Meds: . feeding supplement (ENSURE ENLIVE)  237 mL Oral TID BM  . heparin injection (subcutaneous)  5,000 Units Subcutaneous Q8H  . mouth rinse  15 mL Mouth Rinse BID  . mirtazapine  7.5 mg Oral QHS  . rosuvastatin  10 mg Oral QHS  . sodium chloride flush   3 mL Intravenous Q12H  . vitamin C  500 mg Oral Daily  . zinc sulfate  220 mg Oral Daily   Continuous Infusions: . sodium chloride     PRN Meds:.sodium chloride, acetaminophen, guaiFENesin-dextromethorphan, ondansetron **OR** ondansetron (ZOFRAN) IV, sodium chloride flush  Micro Results Recent Results (from the past 240 hour(s))  Urine culture     Status: Abnormal   Collection Time: 03/07/19  3:26 PM   Specimen: Urine, Catheterized  Result Value Ref Range Status   Specimen Description   Final    URINE, CATHETERIZED Performed at [facility], [address] [place], [place] [address]    Special Requests   Final    NONE Performed at [facility], [address] [place], [place] [address]    Culture >=100,000 COLONIES/mL KLEBSIELLA PNEUMONIAE (A)  Final   Report Status 03/09/2019 FINAL  Final   Organism ID, Bacteria KLEBSIELLA PNEUMONIAE (A)  Final      Susceptibility   Klebsiella pneumoniae - MIC*    AMPICILLIN >=32 RESISTANT Resistant     CEFAZOLIN <=4 SENSITIVE  Sensitive     CEFTRIAXONE <=1 SENSITIVE Sensitive     CIPROFLOXACIN <=0.25 SENSITIVE Sensitive     GENTAMICIN <=1 SENSITIVE Sensitive     IMIPENEM <=0.25 SENSITIVE Sensitive     NITROFURANTOIN 64 INTERMEDIATE Intermediate     TRIMETH/SULFA <=20 SENSITIVE Sensitive     AMPICILLIN/SULBACTAM 4 SENSITIVE Sensitive     PIP/TAZO 8 SENSITIVE Sensitive     Extended ESBL NEGATIVE Sensitive     * >=100,000 COLONIES/mL KLEBSIELLA PNEUMONIAE  SARS CORONAVIRUS 2 (TAT 6-24 HRS) Nasopharyngeal Nasopharyngeal Swab     Status: Abnormal   Collection Time: 03/07/19  6:32 PM   Specimen: Nasopharyngeal Swab  Result Value Ref Range Status   SARS Coronavirus 2 POSITIVE (A) NEGATIVE Final    Comment: RESULT CALLED TO, READ BACK BY AND VERIFIED WITH: WOODS B, RN AT 0348 ON 03/08/2019 BY SAINVILUS S (NOTE) SARS-CoV-2 target nucleic acids are DETECTED. The SARS-CoV-2 RNA is generally detectable in upper and lower  respiratory specimens during the acute phase of infection. Positive results are indicative of active infection with SARS-CoV-2. Clinical  correlation with patient history and other diagnostic information is necessary to determine patient infection status. Positive results do  not rule out bacterial infection or co-infection with other viruses. The expected result is Negative. Fact Sheet for Patients: HairSlick.no Fact Sheet for Healthcare Providers: quierodirigir.com This test is not yet approved or cleared by the [place] FDA and  has been authorized for detection and/or diagnosis of SARS-CoV-2 by FDA under an Emergency Use Authorization (EUA). This EUA will remain  in effect (meaning this test can b e used) for the duration of the COVID-19 declaration under Section 564(b)(1) of the Act, 21 U.S.C. section 360bbb-3(b)(1), unless the authorization is terminated or revoked sooner. Performed at [facility], [address], [place], [place] [address]     Radiology Reports Ct Head Wo Contrast  Result Date: 03/07/2019 CLINICAL DATA:  85 year old female with altered mental status. EXAM: CT HEAD WITHOUT CONTRAST CT CERVICAL SPINE WITHOUT CONTRAST TECHNIQUE: Multidetector CT imaging of the head and cervical spine was performed following the standard protocol without intravenous contrast. Multiplanar CT image reconstructions of the cervical spine were also generated. COMPARISON:  None. FINDINGS: CT HEAD FINDINGS Brain: Moderate to advanced age-related atrophy and chronic microvascular ischemic changes. There is no acute intracranial hemorrhage. No mass effect or midline shift. No extra-axial fluid collection. Vascular: No hyperdense vessel or unexpected calcification. Skull: Normal. Negative for fracture or focal lesion. Sinuses/Orbits: No acute finding. Other: None CT CERVICAL SPINE FINDINGS Alignment: No acute subluxation. Grade 1  C3-C4 anterolisthesis. Skull base and vertebrae: No acute fracture. Osteopenia. Soft tissues and spinal canal: No prevertebral fluid or swelling. No visible canal hematoma. Disc levels: Multilevel degenerative changes with endplate irregularity and disc space narrowing. Upper chest: Biapical subpleural scarring. There is a 5 mm right apical pneumothorax. This is new compared to the CT of 12/09/2016. Other: Bilateral carotid bulb calcified plaques. IMPRESSION: 1. No acute intracranial hemorrhage. Moderate to advanced age-related atrophy and chronic microvascular ischemic changes. 2. No acute/traumatic cervical spine pathology. Multilevel degenerative changes. 3. A 5 mm right apical pneumothorax. These results were called by telephone at the time of interpretation on 03/07/2019 at 4:29 pm to provider [facility] , who verbally acknowledged these results. Electronically Signed   By: Elgie Collard M.D.   On: 03/07/2019 16:30   Ct Cervical Spine Wo Contrast  Result Date: 03/07/2019 CLINICAL DATA:  85 year old female with altered mental status.  EXAM: CT HEAD WITHOUT CONTRAST CT CERVICAL SPINE WITHOUT CONTRAST TECHNIQUE: Multidetector CT imaging of the head and cervical spine was performed following the standard protocol without intravenous contrast. Multiplanar CT image reconstructions of the cervical spine were also generated. COMPARISON:  None. FINDINGS: CT HEAD FINDINGS Brain: Moderate to advanced age-related atrophy and chronic microvascular ischemic changes. There is no acute intracranial hemorrhage. No mass effect or midline shift. No extra-axial fluid collection. Vascular: No hyperdense vessel or unexpected calcification. Skull: Normal. Negative for fracture or focal lesion. Sinuses/Orbits: No acute finding. Other: None CT CERVICAL SPINE FINDINGS Alignment: No acute subluxation. Grade 1 C3-C4 anterolisthesis. Skull base and vertebrae: No acute fracture. Osteopenia. Soft tissues and spinal canal: No  prevertebral fluid or swelling. No visible canal hematoma. Disc levels: Multilevel degenerative changes with endplate irregularity and disc space narrowing. Upper chest: Biapical subpleural scarring. There is a 5 mm right apical pneumothorax. This is new compared to the CT of 12/09/2016. Other: Bilateral carotid bulb calcified plaques. IMPRESSION: 1. No acute intracranial hemorrhage. Moderate to advanced age-related atrophy and chronic microvascular ischemic changes. 2. No acute/traumatic cervical spine pathology. Multilevel degenerative changes. 3. A 5 mm right apical pneumothorax. These results were called by telephone at the time of interpretation on 03/07/2019 at 4:29 pm to provider [facility] , who verbally acknowledged these results. Electronically Signed   By: Elgie Collard M.D.   On: 03/07/2019 16:30   Ct Abdomen Pelvis W Contrast  Result Date: 03/05/2019 CLINICAL DATA:  Upper abdominal pain, diarrhea, decreased appetite EXAM: CT ABDOMEN AND PELVIS WITH CONTRAST TECHNIQUE: Multidetector CT imaging of the abdomen and pelvis was performed using the standard protocol following bolus administration of intravenous contrast. CONTRAST:  75mL OMNIPAQUE IOHEXOL 300 MG/ML  SOLN COMPARISON:  12/20/2012 FINDINGS: Technical note: Examination is degraded by patient motion artifact. Additional metallic streak/beam hardening artifact from right total hip arthroplasty hardware degrades evaluation of the deep pelvis. Lower chest: Trace right pleural effusion with right basilar atelectasis and small dependent areas of airspace consolidation. Left lung base clear. Coronary artery calcifications. Hepatobiliary: No focal liver abnormality is seen. Status post cholecystectomy. No biliary dilatation. Pancreas: Grossly unremarkable. Spleen: Normal in size without focal abnormality. Adrenals/Urinary Tract: Adrenal glands and bilateral kidneys appear grossly unremarkable. Urinary bladder is partially obscured by artifact. No  discrete abnormality. Stomach/Bowel: Negative for bowel obstruction. Small volume liquid stool within bowel suggesting diarrheal state. No definite bowel thickening or inflammatory changes are evident within the limitations of this exam. Vascular/Lymphatic: Aortic atherosclerosis. No enlarged abdominal or pelvic lymph nodes. Reproductive: No appreciable abnormality. No adnexal mass. Other: No ascites. No abdominal wall hernia. Musculoskeletal: Partially visualized right total hip arthroplasty hardware. Degenerative disc disease of the lumbar spine. There is a dextroscoliotic curvature of the lumbar spine. No acute osseous findings are evident. IMPRESSION: 1. Somewhat limited exam.  No acute abdominopelvic findings. 2. Trace right pleural effusion with right basilar atelectasis and small dependent areas of airspace consolidation. 3. Status post cholecystectomy. 4. Aortic atherosclerosis and coronary artery calcifications. Electronically Signed   By: Duanne Guess M.D.   On: 03/05/2019 17:17   Dg Chest Port 1 View  Result Date: 03/08/2019 CLINICAL DATA:  COVID positive, evaluate for pneumothorax EXAM: PORTABLE CHEST 1 VIEW COMPARISON:  2018 FINDINGS: Chronic mild interstitial prominence. No pleural effusion or pneumothorax. Stable cardiomediastinal contours with normal heart size. Calcified plaque along the thoracic aorta. IMPRESSION: No pneumothorax. Electronically Signed   By: Guadlupe Spanish M.D.   On: 03/08/2019 08:35  Dg Chest Portable 1 View  Result Date: 03/07/2019 CLINICAL DATA:  Fever and weakness. EXAM: PORTABLE CHEST 1 VIEW COMPARISON:  Chest x-ray dated December 09, 2016. FINDINGS: The heart size and mediastinal contours are within normal limits. Atherosclerotic calcification of the thoracic aorta. Normal pulmonary vascularity. No focal consolidation, pleural effusion, or pneumothorax. No acute osseous abnormality. IMPRESSION: No active disease. Electronically Signed   By: Obie Dredge M.D.    On: 03/07/2019 15:59      Huey Bienenstock M.D on 03/14/2019 at 3:10 PM  Between 7am to 7pm - Pager - [phone]  After 7pm go to [email or web address] - password [facility]  [facility] -  Office  [phone]

## 2019-03-14 NOTE — Plan of Care (Signed)
Spoke with Friend to inform of patient current status and POC.  Informed that not much has changed since yesterday - Patient still only eating 1-2 bites a meal and about 25% of fluids offered.  No further questions at this time.

## 2019-03-15 DIAGNOSIS — N390 Urinary tract infection, site not specified: Principal | ICD-10-CM

## 2019-03-15 MED ORDER — MORPHINE SULFATE (CONCENTRATE) 10 MG/0.5ML PO SOLN: 10.0000 mg | ORAL | Status: DC | PRN

## 2019-03-15 MED ORDER — HALOPERIDOL LACTATE 2 MG/ML PO CONC
1.0000 mg | Freq: Four times a day (QID) | ORAL | Status: DC | PRN
  Filled 2019-03-15: qty 0.5

## 2019-03-15 NOTE — Progress Notes (Signed)
PROGRESS NOTE                                                                                                                                                                                                             Patient Demographics:    Yvonne Edwards, is a 85 y.o. female, DOB - 1919-01-06, ZOX:[id number]  Admit date - 03/08/2019   Admitting Physician Lorretta Harp, MD  Outpatient Primary MD for the patient is Yvonne Reichmann, MD  LOS - 6   No chief complaint on file.      Brief Narrative    85 y.o.femalewith medical history significant ofbilateral hearing loss,age-related macular degeneration, hyperlipidemia, chronic diarrheawho was sent to ED byher assisted living facility for concerns of fever, weakness and increased confusion, her work-up was significant for UTI, as well she did test positive for COVID-19 pneumonia, so she was transferred to [facility] for further management.    Subjective:    Yvonne Edwards today with no significant events overnight, I have tried to feed her, but she was pushing the food away with her hand , only had one sip of vital today and that it..   Assessment  & Plan :    Principal Problem:   Acute lower UTI Active Problems:   AMD (age related macular degeneration)   Acute metabolic encephalopathy   Other pneumothorax   COVID-19 virus infection   Chronic diarrhea   UTI (urinary tract infection)   Dementia (HCC)   COVID-19 virus infection -Covid 19+ on admission to [facility]. -So far appears to be asymptomatic with no oxygen requirement.  No active cough or respiratory distress. CXR with no infiltration for pneumonia.  -Continue with zinc and vitamin C-Continue to monitor inflammatory markers, CRP is trending down which is reassuring.  COVID-19 Labs  Recent Labs    03/13/19 0135  DDIMER 0.30  CRP 1.7*    Lab Results  Component Value Date   SARSCOV2NAA POSITIVE (A) 03/07/2019     Altered mental  status:  - acute metabolic encephalopathy in the setting of infectious process due to UTI and COVID-19 infection. -Likely due to UTI, CT head with no acute findings. -Status appears to be improving, but slowly, her mental status is difficult to assess, as she is extremely hard of hearing, and speak with very soft sound.  Hypophosphatemia -Repleted.  UTI -Culture growing Klebsiella, treated with IV Rocephin  Right 5 mm apical pneumothorax: -  Incidental finding on CT spine. Patient is not hypoxic and not tachypneic on exam. The repeated CXR is negative for pneumothorax  Hypokalemia:  -Repleted  Chronic diarrhea and mild AP:  - pt is on immodium for this. CT of abdomen is unremarkable. -Hold Imodium until C diff negative -Hold laxatives   Hyperlipidemia -Discontinue rosuvastatin as she is comfort care  Dementia -Continue mirtazapine  Protein calorie malnutrition: Moderate. /Failure to thrive -consulted dietitian.  her oral intake remains very poor, would not eat any of her food, she barely tolerated few sips of supplements.  Pushes food and nutrition out, palliative care consulted, they discussed with [facility] POA, and is extremely frail, with no oral intake, aggressive measures, current goals are comfort, social worker consulted to see if they can arrange for [facility] .  Code Status : DNR/Comfort  Family Communication  : Patient has no alive  family members as discussed with caregiver Vita Barley, her HCPOA updated yesterday.  Disposition Plan  : Need hospice placement, care management consulted for [facility] to evaluate  Consults  : palliative Medicine  Procedures  : None  DVT Prophylaxis  : Comfort   Lab Results  Component Value Date   PLT 245 03/13/2019    Antibiotics  :    Anti-infectives (From admission, onward)   Start     Dose/Rate Route Frequency Ordered Stop   03/08/19 2200  cefTRIAXone (ROCEPHIN) 1 g in sodium chloride 0.9 % 100 mL  IVPB     1 g 200 mL/hr over 30 Minutes Intravenous Every 24 hours 03/08/19 1925 03/12/19 0700        Objective:   Vitals:   03/14/19 1710 03/14/19 2000 03/15/19 0355 03/15/19 0727  BP: (!) 115/52 (!) 105/92 122/67 (!) 104/53  Pulse: 95 99 96 92  Resp: 18 18 18 16   Temp: 98.5 F (36.9 C) 98.2 F (36.8 C) 98 F (36.7 C) 97.6 F (36.4 C)  TempSrc: Oral Oral Oral Axillary  SpO2: 93% 94% 94% 94%  Weight:      Height:        Wt Readings from Last 3 Encounters:  03/09/19 54.4 kg  03/07/19 47 kg  03/05/19 47 kg     Intake/Output Summary (Last 24 hours) at 03/15/2019 1424 Last data filed at 03/15/2019 1327 Gross per 24 hour  Intake 180 ml  Output 200 ml  Net -20 ml     Physical Exam  Awake , demented, impaired cognition and insight, in no apparent distress , frail  symmetrical Chest wall movement. RRR,No Gallops,Rubs or new Murmurs, No Parasternal Heave +ve B.Sounds, Abd Soft,  No rebound - guarding or rigidity. No Cyanosis, Clubbing or edema, No new Rash or bruise        Data Review:    CBC Recent Labs  Lab 03/09/19 0210 03/10/19 0015 03/11/19 0405 03/12/19 0545 03/13/19 0135  WBC 4.7 4.1 4.1 5.7 5.5  HGB 14.0 13.1 13.3 14.0 13.4  HCT 43.1 40.8 41.2 44.0 41.9  PLT 202 212 228 229 245  MCV 90.2 91.3 90.4 90.2 91.1  MCH 29.3 29.3 29.2 28.7 29.1  MCHC 32.5 32.1 32.3 31.8 32.0  RDW 12.6 12.9 12.9 12.8 12.9  LYMPHSABS 0.5* 0.7 0.8 0.7 1.2  MONOABS 0.7 0.6 0.7 0.7 0.9  EOSABS 0.0 0.0 0.1 0.1 0.2  BASOSABS 0.0 0.0 0.0 0.0 0.1    Chemistries  Recent Labs  Lab 03/09/19 0210 03/10/19 0015 03/11/19 0405 03/12/19 0545 03/13/19 0135  NA 138 141 141 139 139  K 3.8 3.3* 4.0 4.2 4.8  CL 101 106 105 100 99  CO2 26 26 29 29 30   GLUCOSE 97 98 110* 114* 90  BUN 19 21 20 15 15   CREATININE 0.70 0.75 0.60 0.66 0.74  CALCIUM 8.5* 8.1* 8.2* 8.4* 8.6*  AST 31 27 33 50* 55*  ALT 20 19 20 31  32  ALKPHOS 66 61 62 70 77  BILITOT 0.7 0.3 0.8 0.8 0.5    ------------------------------------------------------------------------------------------------------------------ No results for input(s): CHOL, HDL, LDLCALC, TRIG, CHOLHDL, LDLDIRECT in the last 72 hours.  Lab Results  Component Value Date   HGBA1C 5.9 (H) 12/05/2016   ------------------------------------------------------------------------------------------------------------------ No results for input(s): TSH, T4TOTAL, T3FREE, THYROIDAB in the last 72 hours.  Invalid input(s): FREET3 ------------------------------------------------------------------------------------------------------------------ No results for input(s): VITAMINB12, FOLATE, FERRITIN, TIBC, IRON, RETICCTPCT in the last 72 hours.  Coagulation profile No results for input(s): INR, PROTIME in the last 168 hours.  Recent Labs    03/13/19 0135  DDIMER 0.30    Cardiac Enzymes No results for input(s): CKMB, TROPONINI, MYOGLOBIN in the last 168 hours.  Invalid input(s): CK ------------------------------------------------------------------------------------------------------------------    Component Value Date/Time   BNP 134.0 (H) 03/08/2019 0553    Inpatient Medications  Scheduled Meds:  feeding supplement (ENSURE ENLIVE)  237 mL Oral TID BM   mouth rinse  15 mL Mouth Rinse BID   mirtazapine  7.5 mg Oral QHS   sodium chloride flush  3 mL Intravenous Q12H   Continuous Infusions:  sodium chloride     PRN Meds:.sodium chloride, acetaminophen, guaiFENesin-dextromethorphan, haloperidol, morphine CONCENTRATE, ondansetron **OR** ondansetron (ZOFRAN) IV, sodium chloride flush  Micro Results Recent Results (from the past 240 hour(s))  Urine culture     Status: Abnormal   Collection Time: 03/07/19  3:26 PM   Specimen: Urine, Catheterized  Result Value Ref Range Status   Specimen Description   Final    URINE, CATHETERIZED Performed at [facility], [address] [place], [facility] [address]     Special Requests   Final    NONE Performed at [facility], [address] [place], [facility] [address]    Culture >=100,000 COLONIES/mL KLEBSIELLA PNEUMONIAE (A)  Final   Report Status 03/09/2019 FINAL  Final   Organism ID, Bacteria KLEBSIELLA PNEUMONIAE (A)  Final      Susceptibility   Klebsiella pneumoniae - MIC*    AMPICILLIN >=32 RESISTANT Resistant     CEFAZOLIN <=4 SENSITIVE Sensitive     CEFTRIAXONE <=1 SENSITIVE Sensitive     CIPROFLOXACIN <=0.25 SENSITIVE Sensitive     GENTAMICIN <=1 SENSITIVE Sensitive     IMIPENEM <=0.25 SENSITIVE Sensitive     NITROFURANTOIN 64 INTERMEDIATE Intermediate     TRIMETH/SULFA <=20 SENSITIVE Sensitive     AMPICILLIN/SULBACTAM 4 SENSITIVE Sensitive     PIP/TAZO 8 SENSITIVE Sensitive     Extended ESBL NEGATIVE Sensitive     * >=100,000 COLONIES/mL KLEBSIELLA PNEUMONIAE  SARS CORONAVIRUS 2 (TAT 6-24 HRS) Nasopharyngeal Nasopharyngeal Swab     Status: Abnormal   Collection Time: 03/07/19  6:32 PM   Specimen: Nasopharyngeal Swab  Result Value Ref Range Status   SARS Coronavirus 2 POSITIVE (A) NEGATIVE Final    Comment: RESULT CALLED TO, READ BACK BY AND VERIFIED WITH: WOODS B, RN AT 0348 ON 03/08/2019 BY SAINVILUS S (NOTE) SARS-CoV-2 target nucleic acids are DETECTED. The SARS-CoV-2 RNA is generally  detectable in upper and lower respiratory specimens during the acute phase of infection. Positive results are indicative of active infection with SARS-CoV-2. Clinical  correlation with patient history and other diagnostic information is necessary to determine patient infection status. Positive results do  not rule out bacterial infection or co-infection with other viruses. The expected result is Negative. Fact Sheet for Patients: HairSlick.no Fact Sheet for Healthcare Providers: quierodirigir.com This test is not yet approved or cleared by the [place] FDA and  has been  authorized for detection and/or diagnosis of SARS-CoV-2 by FDA under an Emergency Use Authorization (EUA). This EUA will remain  in effect (meaning this test can b e used) for the duration of the COVID-19 declaration under Section 564(b)(1) of the Act, 21 U.S.C. section 360bbb-3(b)(1), unless the authorization is terminated or revoked sooner. Performed at [facility], [address] [place], [place] [address]     Radiology Reports Ct Head Wo Contrast  Result Date: 03/07/2019 CLINICAL DATA:  85 year old female with altered mental status. EXAM: CT HEAD WITHOUT CONTRAST CT CERVICAL SPINE WITHOUT CONTRAST TECHNIQUE: Multidetector CT imaging of the head and cervical spine was performed following the standard protocol without intravenous contrast. Multiplanar CT image reconstructions of the cervical spine were also generated. COMPARISON:  None. FINDINGS: CT HEAD FINDINGS Brain: Moderate to advanced age-related atrophy and chronic microvascular ischemic changes. There is no acute intracranial hemorrhage. No mass effect or midline shift. No extra-axial fluid collection. Vascular: No hyperdense vessel or unexpected calcification. Skull: Normal. Negative for fracture or focal lesion. Sinuses/Orbits: No acute finding. Other: None CT CERVICAL SPINE FINDINGS Alignment: No acute subluxation. Grade 1 C3-C4 anterolisthesis. Skull base and vertebrae: No acute fracture. Osteopenia. Soft tissues and spinal canal: No prevertebral fluid or swelling. No visible canal hematoma. Disc levels: Multilevel degenerative changes with endplate irregularity and disc space narrowing. Upper chest: Biapical subpleural scarring. There is a 5 mm right apical pneumothorax. This is new compared to the CT of 12/09/2016. Other: Bilateral carotid bulb calcified plaques. IMPRESSION: 1. No acute intracranial hemorrhage. Moderate to advanced age-related atrophy and chronic microvascular ischemic changes. 2. No acute/traumatic cervical  spine pathology. Multilevel degenerative changes. 3. A 5 mm right apical pneumothorax. These results were called by telephone at the time of interpretation on 03/07/2019 at 4:29 pm to provider [facility] , who verbally acknowledged these results. Electronically Signed   By: Elgie Collard M.D.   On: 03/07/2019 16:30   Ct Cervical Spine Wo Contrast  Result Date: 03/07/2019 CLINICAL DATA:  85 year old female with altered mental status. EXAM: CT HEAD WITHOUT CONTRAST CT CERVICAL SPINE WITHOUT CONTRAST TECHNIQUE: Multidetector CT imaging of the head and cervical spine was performed following the standard protocol without intravenous contrast. Multiplanar CT image reconstructions of the cervical spine were also generated. COMPARISON:  None. FINDINGS: CT HEAD FINDINGS Brain: Moderate to advanced age-related atrophy and chronic microvascular ischemic changes. There is no acute intracranial hemorrhage. No mass effect or midline shift. No extra-axial fluid collection. Vascular: No hyperdense vessel or unexpected calcification. Skull: Normal. Negative for fracture or focal lesion. Sinuses/Orbits: No acute finding. Other: None CT CERVICAL SPINE FINDINGS Alignment: No acute subluxation. Grade 1 C3-C4 anterolisthesis. Skull base and vertebrae: No acute fracture. Osteopenia. Soft tissues and spinal canal: No prevertebral fluid or swelling. No visible canal hematoma. Disc levels: Multilevel degenerative changes with endplate irregularity and disc space narrowing. Upper chest: Biapical subpleural scarring. There is a 5 mm right apical pneumothorax. This is new compared to the CT  of 12/09/2016. Other: Bilateral carotid bulb calcified plaques. IMPRESSION: 1. No acute intracranial hemorrhage. Moderate to advanced age-related atrophy and chronic microvascular ischemic changes. 2. No acute/traumatic cervical spine pathology. Multilevel degenerative changes. 3. A 5 mm right apical pneumothorax. These results were called by  telephone at the time of interpretation on 03/07/2019 at 4:29 pm to provider [facility] , who verbally acknowledged these results. Electronically Signed   By: Elgie CollardArash  Radparvar M.D.   On: 03/07/2019 16:30   Ct Abdomen Pelvis W Contrast  Result Date: 03/05/2019 CLINICAL DATA:  Upper abdominal pain, diarrhea, decreased appetite EXAM: CT ABDOMEN AND PELVIS WITH CONTRAST TECHNIQUE: Multidetector CT imaging of the abdomen and pelvis was performed using the standard protocol following bolus administration of intravenous contrast. CONTRAST:  75mL OMNIPAQUE IOHEXOL 300 MG/ML  SOLN COMPARISON:  12/20/2012 FINDINGS: Technical note: Examination is degraded by patient motion artifact. Additional metallic streak/beam hardening artifact from right total hip arthroplasty hardware degrades evaluation of the deep pelvis. Lower chest: Trace right pleural effusion with right basilar atelectasis and small dependent areas of airspace consolidation. Left lung base clear. Coronary artery calcifications. Hepatobiliary: No focal liver abnormality is seen. Status post cholecystectomy. No biliary dilatation. Pancreas: Grossly unremarkable. Spleen: Normal in size without focal abnormality. Adrenals/Urinary Tract: Adrenal glands and bilateral kidneys appear grossly unremarkable. Urinary bladder is partially obscured by artifact. No discrete abnormality. Stomach/Bowel: Negative for bowel obstruction. Small volume liquid stool within bowel suggesting diarrheal state. No definite bowel thickening or inflammatory changes are evident within the limitations of this exam. Vascular/Lymphatic: Aortic atherosclerosis. No enlarged abdominal or pelvic lymph nodes. Reproductive: No appreciable abnormality. No adnexal mass. Other: No ascites. No abdominal wall hernia. Musculoskeletal: Partially visualized right total hip arthroplasty hardware. Degenerative disc disease of the lumbar spine. There is a dextroscoliotic curvature of the lumbar spine. No  acute osseous findings are evident. IMPRESSION: 1. Somewhat limited exam.  No acute abdominopelvic findings. 2. Trace right pleural effusion with right basilar atelectasis and small dependent areas of airspace consolidation. 3. Status post cholecystectomy. 4. Aortic atherosclerosis and coronary artery calcifications. Electronically Signed   By: Duanne GuessNicholas  Plundo M.D.   On: 03/05/2019 17:17   Dg Chest Port 1 View  Result Date: 03/08/2019 CLINICAL DATA:  COVID positive, evaluate for pneumothorax EXAM: PORTABLE CHEST 1 VIEW COMPARISON:  2018 FINDINGS: Chronic mild interstitial prominence. No pleural effusion or pneumothorax. Stable cardiomediastinal contours with normal heart size. Calcified plaque along the thoracic aorta. IMPRESSION: No pneumothorax. Electronically Signed   By: Guadlupe SpanishPraneil  Patel M.D.   On: 03/08/2019 08:35   Dg Chest Portable 1 View  Result Date: 03/07/2019 CLINICAL DATA:  Fever and weakness. EXAM: PORTABLE CHEST 1 VIEW COMPARISON:  Chest x-ray dated December 09, 2016. FINDINGS: The heart size and mediastinal contours are within normal limits. Atherosclerotic calcification of the thoracic aorta. Normal pulmonary vascularity. No focal consolidation, pleural effusion, or pneumothorax. No acute osseous abnormality. IMPRESSION: No active disease. Electronically Signed   By: Obie DredgeWilliam T Derry M.D.   On: 03/07/2019 15:59      Huey Bienenstockawood Wynona Duhamel M.D on 03/15/2019 at 2:24 PM  Between 7am to 7pm - Pager - [phone]  After 7pm go to [email or web address] - password [email or web address]  [facility] -  Office  [phone]

## 2019-03-15 NOTE — TOC Initial Note (Signed)
Transition of Care [facility]) - Initial/Assessment Note    Patient Details  Name: Yvonne Edwards MRN: [id number] Date of Birth: 03-03-1919  Transition of Care [facility]) CM/SW Contact:    Ajane Novella, Joyice Faster, LCSW Phone Number: 03/15/2019, 10:21 PM  Clinical Narrative:                  Clinical Social Worker spoke with patient POA, [name] who states that patient is a current resident at [facility].  Patient POA did meet with Palliative and have transitioned patient to comfort care with the hopes of getting patient into the residential hospice facility in [facility].  Unfortunately, [place] residential hospice is not taking positive COVID patients at this time.  CSW relayed that the only residential hospice facility that we are aware of accepting COVID positive patients was [facility].  CSW will get confirmation of this.  Patient POA is against patient going to [place] and requests that CSW reach out to patient current facility to see if she can return with Hospice following vs SNF placement in [facility].  CSW did explain that patient positive COVID status would likely be a barrier for most forms of placement but we will pursue POA wishes and update her accordingly.  Patient POA very kind and realistic about patient needs and placement concerns.  CSW remains available for support and to facilitate patient discharge needs once disposition venue is determined.  Expected Discharge Plan: Hospice Medical Facility Barriers to Discharge: Hospice Bed not available, No SNF bed, Other (comment)(COVID positive Hospice Placement)   Patient Goals and CMS Choice Patient states their goals for this hospitalization and ongoing recovery are:: Patient POA would like for patient to be close by and hopeful that they can see as she nears end of life CMS Medicare.gov Compare Post Acute Care list provided to:: Patient Represenative (must comment) Choice offered to / list presented to :  [facility] POA / Guardian  Expected Discharge Plan and Services Expected Discharge Plan: Hospice Medical Facility In-house Referral: Clinical Social Work, Hospice / Palliative Care   Post Acute Care Choice: Skilled Nursing Facility, Hospice Living arrangements for the past 2 months: Assisted Living Facility([facility])                                      Prior Living Arrangements/Services Living arrangements for the past 2 months: Assisted Living Facility([facility]) Lives with:: Facility Resident Patient language and need for interpreter reviewed:: Yes Do you feel safe going back to the place where you live?: Yes      Need for Family Participation in Patient Care: Yes (Comment) Care giver support system in place?: Yes (comment) Current home services: Home OT, Home PT Criminal Activity/Legal Involvement Pertinent to Current Situation/Hospitalization: No - Comment as needed  Activities of Daily Living Home Assistive Devices/Equipment: None ADL Screening (condition at time of admission) Patient's cognitive ability adequate to safely complete daily activities?: No Is the patient deaf or have difficulty hearing?: Yes Does the patient have difficulty seeing, even when wearing glasses/contacts?: No Does the patient have difficulty concentrating, remembering, or making decisions?: Yes Patient able to express need for assistance with ADLs?: Yes Does the patient have difficulty dressing or bathing?: Yes Independently performs ADLs?: No Communication: Independent Dressing (OT): Needs assistance Is this a change from baseline?: Pre-admission baseline Grooming: Needs assistance Is this a change from baseline?: Pre-admission baseline Feeding: Needs assistance  Is this a change from baseline?: Pre-admission baseline Bathing: Needs assistance Is this a change from baseline?: Pre-admission baseline Toileting: Needs assistance Does the patient have difficulty walking or  climbing stairs?: Yes Weakness of Legs: Both Weakness of Arms/Hands: Both  Permission Sought/Granted Permission sought to share information with : Facility Sport and exercise psychologist, Other (comment)(POA)    Share Information with NAME: Vita Barley  Permission granted to share info w AGENCY: [place] granted to share info w Relationship: POA  Permission granted to share info w Contact Information: [phone]  Emotional Assessment   Attitude/Demeanor/Rapport: Unable to Assess Affect (typically observed): Unable to Assess Orientation: : Oriented to Self Alcohol / Substance Use: Not Applicable Psych Involvement: No (comment)  Admission diagnosis:  COVID 19 VIRUS INFECTION Patient Active Problem List   Diagnosis Date Noted  . Acute lower UTI 03/08/2019  . COVID-19 virus infection 03/08/2019  . Chronic diarrhea 03/08/2019  . UTI (urinary tract infection) 03/08/2019  . Dementia ([place]) 03/08/2019  . Acute metabolic encephalopathy [id number]/94/[id number]  . Hypokalemia 03/07/2019  . Protein calorie malnutrition ([place]) 03/07/2019  . Other pneumothorax 03/07/2019  . Overactive bladder 01/03/2017  . Chest pain 12/09/2016  . Age-related osteoporosis with current pathological fracture with routine healing 12/08/2016  . Hypoxia 12/08/2016  . S/P total hip arthroplasty 12/07/2016  . Hyponatremia 12/07/2016  . Hyperglycemia, unspecified 12/07/2016  . Thrombocytopenia ([place]) 12/07/2016  . Leukocytosis 12/07/2016  . MRSA carrier 12/07/2016  . Vitamin D deficiency 12/07/2016  . Hip fracture ([place]) 12/04/2016  . AMD (age related macular degeneration) 12/02/2015  . Bilateral hearing loss 12/02/2015  . Pure hypercholesterolemia 12/02/2015   PCP:  Tracie Harrier, MD Pharmacy:   [facility], [place] [address] Phone: [phone] Fax: [phone]     Social Determinants  of Health (SDOH) Interventions    Readmission Risk Interventions No flowsheet data found.

## 2019-03-15 NOTE — Progress Notes (Signed)
   03/15/19 1146  Family/Significant Other Communication  Family/Significant Other Update Called;Updated;Other (Comment) Vita Barley [phone])

## 2019-03-15 NOTE — Progress Notes (Signed)
Updated family friend Havoline about status questions answered at this time.

## 2019-03-15 NOTE — NC FL2 (Signed)
[place] LEVEL OF CARE SCREENING TOOL     IDENTIFICATION  Patient Name: Yvonne Edwards Birthdate: 05-04-1918 Sex: female Admission Date (Current Location): 03/08/2019  [facility] and [place] Number:  Herbalist and Address:         Provider Number: [id number]  Attending Physician Name and Address:  Albertine Patricia, MD  Relative Name and Phone Number:  Vita Barley [phone]    Current Level of Care: Hospital Recommended Level of Care: [place], Other (Comment)(Residential Hospice) Prior Approval Number:    Date Approved/Denied:   PASRR Number:    Discharge Plan: SNF(with hospice to follow)    Current Diagnoses: Patient Active Problem List   Diagnosis Date Noted  . Acute lower UTI 03/08/2019  . COVID-19 virus infection 03/08/2019  . Chronic diarrhea 03/08/2019  . UTI (urinary tract infection) 03/08/2019  . Dementia ([place]) 03/08/2019  . Acute metabolic encephalopathy 41/32/4401  . Hypokalemia 03/07/2019  . Protein calorie malnutrition ([place]) 03/07/2019  . Other pneumothorax 03/07/2019  . Overactive bladder 01/03/2017  . Chest pain 12/09/2016  . Age-related osteoporosis with current pathological fracture with routine healing 12/08/2016  . Hypoxia 12/08/2016  . S/P total hip arthroplasty 12/07/2016  . Hyponatremia 12/07/2016  . Hyperglycemia, unspecified 12/07/2016  . Thrombocytopenia ([place]) 12/07/2016  . Leukocytosis 12/07/2016  . MRSA carrier 12/07/2016  . Vitamin D deficiency 12/07/2016  . Hip fracture ([place]) 12/04/2016  . AMD (age related macular degeneration) 12/02/2015  . Bilateral hearing loss 12/02/2015  . Pure hypercholesterolemia 12/02/2015    Orientation RESPIRATION BLADDER Height & Weight     Self  Normal Incontinent, External catheter Weight: 120 lb (54.4 kg) Height:  4' 9.99" (147.3 cm)  BEHAVIORAL SYMPTOMS/MOOD NEUROLOGICAL BOWEL NUTRITION STATUS      Incontinent Diet(Heart Healthy with Thin Liquids)   AMBULATORY STATUS COMMUNICATION OF NEEDS Skin   Total Care Verbally Normal                       Personal Care Assistance Level of Assistance  Bathing, Feeding, Dressing Bathing Assistance: Maximum assistance Feeding assistance: Maximum assistance Dressing Assistance: Maximum assistance     Functional Limitations Info  Sight, Hearing, Speech Sight Info: Impaired Hearing Info: Adequate Speech Info: Adequate    SPECIAL CARE FACTORS FREQUENCY                       Contractures Contractures Info: Not present    Additional Factors Info  Code Status, Isolation Precautions, Allergies, Psychotropic Code Status Info: DNR Allergies Info: Biaxin Clarithromycin, Codeine, Shellfish Allergy Psychotropic Info: Remeron   Isolation Precautions Info: COVID positive     Current Medications (03/15/2019):  This is the current hospital active medication list Current Facility-Administered Medications  Medication Dose Route Frequency Provider Last Rate Last Dose  . 0.9 %  sodium chloride infusion  250 mL Intravenous PRN Phillips Grout, MD      . acetaminophen (TYLENOL) tablet 650 mg  650 mg Oral Q6H PRN Derrill Kay A, MD      . feeding supplement (ENSURE ENLIVE) (ENSURE ENLIVE) liquid 237 mL  237 mL Oral TID BM Elgergawy, Silver Huguenin, MD   237 mL at 03/15/19 2228  . guaiFENesin-dextromethorphan (ROBITUSSIN DM) 100-10 MG/5ML syrup 10 mL  10 mL Oral Q4H PRN Derrill Kay A, MD      . haloperidol (HALDOL) 2 MG/ML solution 1 mg  1 mg Sublingual Q6H PRN Lane Hacker L, DO      .  MEDLINE mouth rinse  15 mL Mouth Rinse BID Haydee Monica, MD   15 mL at 03/15/19 2229  . mirtazapine (REMERON) tablet 7.5 mg  7.5 mg Oral QHS Elgergawy, Leana Roe, MD   7.5 mg at 03/15/19 2227  . morphine CONCENTRATE 10 MG/0.5ML oral solution 10 mg  10 mg Sublingual Q1H PRN Anderson Malta L, DO      . ondansetron (ZOFRAN) tablet 4 mg  4 mg Oral Q6H PRN Haydee Monica, MD       Or  . ondansetron  [facility]) injection 4 mg  4 mg Intravenous Q6H PRN Haydee Monica, MD   4 mg at 03/09/19 1249  . sodium chloride flush (NS) 0.9 % injection 3 mL  3 mL Intravenous Q12H Tarry Kos A, MD   3 mL at 03/15/19 2228  . sodium chloride flush (NS) 0.9 % injection 3 mL  3 mL Intravenous PRN Haydee Monica, MD   3 mL at 03/15/19 2228     Discharge Medications: Please see discharge summary for a list of discharge medications.  Relevant Imaging Results:  Relevant Lab Results:   Additional Information SSN [id number]  [place], [place]

## 2019-03-15 NOTE — Plan of Care (Signed)
Discussed plan of care with patient, pain management and updating her friend with some teach back displayed

## 2019-03-15 NOTE — Consult Note (Signed)
Palliative Care Consult Note  85 yo woman from ALF, recent diagnosis of COVID-19 infection now with failure to thrive, not eating, and approaching EOL. Palliative Care consulted for hospice evaluation and goals of care.  I spoke with her HCPOA Mrs. Yvonne Edwards who fully endorses a comfort care approach. She is requesting [facility].  Recommendations:  1. DNR, confirmed 2. Prognosis: <2 weeks if not hours to days. 3. Palliative Prophylaxis and Symptom Management Medications ordered. 4. Discontinue uncomfortable monitoring or diagnositics. 5. Lowest possible O2 for comfort, may discontinue  6. Referral for placement at [facility] if she is stable enough to transport.  Yvonne Hacker, DO Palliative Medicine [phone]  Time: 30 minutes Greater than 50%  of this time was spent counseling and coordinating care related to the above assessment and plan.

## 2019-03-16 MED ORDER — ENSURE ENLIVE PO LIQD
237.0000 mL | Freq: Three times a day (TID) | ORAL | Status: DC
  Administered 2019-03-16 – 2019-03-18 (×5): 237 mL via ORAL

## 2019-03-16 NOTE — Progress Notes (Signed)
PROGRESS NOTE                                                                                                                                                                                                             Patient Demographics:    Yvonne Edwards, is a 68[name], DOB - [birth year], [id number]  Admit date - 03/08/2019   Admitting Physician Lorretta Harp, MD  Outpatient Primary MD for the patient is [name], MD  LOS - 7   No chief complaint on file.      Brief Narrative    85 y.o.femalewith medical history significant ofbilateral hearing loss,age-related macular degeneration, hyperlipidemia, chronic diarrheawho was sent to ED byher assisted living facility for concerns of fever, weakness and increased confusion, her work-up was significant for UTI, as well she did test positive for COVID-19 pneumonia, so she was transferred to [facility] for further management.    Subjective:   Patient in bed in no discomfort but will not answer questions or follow commands, she spit out her breakfast per nurse this morning.   Assessment  & Plan :     COVID-19 virus infection -Covid 19+ on admission to [facility]. -So far appears to be asymptomatic with no oxygen requirement.  No active cough or respiratory distress. CXR with no infiltration for pneumonia.  -Continue with zinc and vitamin C-Continue to monitor inflammatory markers, CRP is trending down which is reassuring.  COVID-19 Labs  No results for input(s): DDIMER, FERRITIN, LDH, CRP in the last 72 hours.  Lab Results  Component Value Date   SARSCOV2NAA POSITIVE (A) 03/07/2019     Altered mental status:  - acute metabolic encephalopathy in the setting of infectious process due to UTI and COVID-19 infection. -Likely due to UTI, CT head with no acute findings. -Status appears to be improving, but slowly, her mental status is difficult to assess, as she is extremely hard of hearing,  and speak with very soft sound.  Hypophosphatemia -Repleted.  UTI -Culture growing Klebsiella, treated with IV Rocephin  Right 5 mm apical pneumothorax: -  Incidental finding on CT spine. Patient is not hypoxic and not tachypneic on exam. The repeated CXR is negative for pneumothorax  Hypokalemia:  -Repleted  Chronic diarrhea and mild AP:  - pt is on immodium for this. CT of abdomen is unremarkable. -  Hold Imodium until C diff negative -Hold laxatives   Hyperlipidemia -Discontinue rosuvastatin as she is comfort care  Dementia -Continue mirtazapine  Protein calorie malnutrition: Moderate. /Failure to thrive -consulted dietitian.  her oral intake remains very poor, would not eat any of her food, she barely tolerated few sips of supplements.  Pushes food and nutrition out, palliative care consulted, they discussed with [facility] PO[facility], and is extremely frail, with no oral intake, aggressive measures, current goals are comfort, social worker consulted to see if they can arrange for [place] hospice .    Code Status : DNR/Comfort  Family Communication  : Patient has no alive  family members, previous MD had discussed with caregiver Vita Barley, her HCPOA.  Disposition Plan  : Need hospice placement, care management consulted for [facility] to evaluate  Consults  : palliative Medicine  Procedures  : None  DVT Prophylaxis  : Comfort   Lab Results  Component Value Date   PLT 245 03/13/2019    Antibiotics  :    Anti-infectives (From admission, onward)   Start     Dose/Rate Route Frequency Ordered Stop   03/08/19 2200  cefTRIAXone (ROCEPHIN) 1 g in sodium chloride 0.9 % 100 mL IVPB     1 g 200 mL/hr over 30 Minutes Intravenous Every 24 hours 03/08/19 1925 03/12/19 0700        Objective:   Vitals:   03/15/19 0727 03/15/19 1512 03/15/19 2200 03/16/19 0713  BP: (!) 104/53 123/71 (!) 151/83 (!) 114/55  Pulse: 92 97  90  Resp: 16 18 16 19   Temp: 97.6 F (36.4  C) 98.9 F (37.2 C) 98.3 F (36.8 C) 97.9 F (36.6 C)  TempSrc: Axillary Oral Oral Axillary  SpO2: 94% 94% 93% 94%  Weight:      Height:        Wt Readings from Last 3 Encounters:  03/09/19 54.4 kg  03/07/19 47 kg  03/05/19 47 kg     Intake/Output Summary (Last 24 hours) at 03/16/2019 0912 Last data filed at 03/16/2019 0626 Gross per 24 hour  Intake 370 ml  Output 300 ml  Net 70 ml     Physical Exam  Frail elderly white female lying in hospital bed in no distress, will not answer questions or follow commands, [email or web address]  Supple Neck,No JVD, No cervical lymphadenopathy appriciated.  Symmetrical Chest wall movement, Good air movement bilaterally, CTAB RRR,No Gallops, Rubs or new Murmurs, No Parasternal Heave +ve B.Sounds, Abd Soft, No tenderness, No organomegaly appriciated, No rebound - guarding or rigidity. No Cyanosis, Clubbing or edema, No new Rash or bruise     Data Review:    CBC Recent Labs  Lab 03/10/19 0015 03/11/19 0405 03/12/19 0545 03/13/19 0135  WBC 4.1 4.1 5.7 5.5  HGB 13.1 13.3 14.0 13.4  HCT 40.8 41.2 44.0 41.9  PLT 212 228 229 245  MCV 91.3 90.4 90.2 91.1  MCH 29.3 29.2 28.7 29.1  MCHC 32.1 32.3 31.8 32.0  RDW 12.9 12.9 12.8 12.9  LYMPHSABS 0.7 0.8 0.7 1.2  MONOABS 0.6 0.7 0.7 0.9  EOSABS 0.0 0.1 0.1 0.2  BASOSABS 0.0 0.0 0.0 0.1    Chemistries  Recent Labs  Lab 03/10/19 0015 03/11/19 0405 03/12/19 0545 03/13/19 0135  NA 141 141 139 139  K 3.3* 4.0 4.2 4.8  CL 106 105 100 99  CO2 26 29 29 30   GLUCOSE 98 110* 114* 90  BUN 21 20 15 15   CREATININE 0.75 0.60 0.66  0.74  CALCIUM 8.1* 8.2* 8.4* 8.6*  AST 27 33 50* 55*  ALT 19 20 31  32  ALKPHOS 61 62 70 77  BILITOT 0.3 0.8 0.8 0.5   ------------------------------------------------------------------------------------------------------------------ No results for input(s): CHOL, HDL, LDLCALC, TRIG, CHOLHDL, LDLDIRECT in the last 72 hours.  Lab Results  Component Value Date    HGBA1C 5.9 (H) 12/05/2016   ------------------------------------------------------------------------------------------------------------------ No results for input(s): TSH, T4TOTAL, T3FREE, THYROIDAB in the last 72 hours.  Invalid input(s): FREET3 ------------------------------------------------------------------------------------------------------------------ No results for input(s): VITAMINB12, FOLATE, FERRITIN, TIBC, IRON, RETICCTPCT in the last 72 hours.  Coagulation profile No results for input(s): INR, PROTIME in the last 168 hours.  No results for input(s): DDIMER in the last 72 hours.  Cardiac Enzymes No results for input(s): CKMB, TROPONINI, MYOGLOBIN in the last 168 hours.  Invalid input(s): CK ------------------------------------------------------------------------------------------------------------------    Component Value Date/Time   BNP 134.0 (H) 03/08/2019 0553    Inpatient Medications  Scheduled Meds:  feeding supplement (ENSURE ENLIVE)  237 mL Oral TID BM   mouth rinse  15 mL Mouth Rinse BID   mirtazapine  7.5 mg Oral QHS   sodium chloride flush  3 mL Intravenous Q12H   Continuous Infusions:  sodium chloride     PRN Meds:.sodium chloride, acetaminophen, guaiFENesin-dextromethorphan, haloperidol, morphine CONCENTRATE, ondansetron **OR** ondansetron (ZOFRAN) IV, sodium chloride flush  Micro Results Recent Results (from the past 240 hour(s))  Urine culture     Status: Abnormal   Collection Time: 03/07/19  3:26 PM   Specimen: Urine, Catheterized  Result Value Ref Range Status   Specimen Description   Final    URINE, CATHETERIZED Performed at [facility], [address] [place], [place]    Special Requests   Final    NONE Performed at [facility], [address] [place], [place]    Culture >=100,000 COLONIES/mL KLEBSIELLA PNEUMONIAE (A)  Final   Report Status 03/09/2019 FINAL  Final   Organism ID,  Bacteria KLEBSIELLA PNEUMONIAE (A)  Final      Susceptibility   Klebsiella pneumoniae - MIC*    AMPICILLIN >=32 RESISTANT Resistant     CEFAZOLIN <=4 SENSITIVE Sensitive     CEFTRIAXONE <=1 SENSITIVE Sensitive     CIPROFLOXACIN <=0.25 SENSITIVE Sensitive     GENTAMICIN <=1 SENSITIVE Sensitive     IMIPENEM <=0.25 SENSITIVE Sensitive     NITROFURANTOIN 64 INTERMEDIATE Intermediate     TRIMETH/SULFA <=20 SENSITIVE Sensitive     AMPICILLIN/SULBACTAM 4 SENSITIVE Sensitive     PIP/TAZO 8 SENSITIVE Sensitive     Extended ESBL NEGATIVE Sensitive     * >=100,000 COLONIES/mL KLEBSIELLA PNEUMONIAE  SARS CORONAVIRUS 2 (TAT 6-24 HRS) Nasopharyngeal Nasopharyngeal Swab     Status: Abnormal   Collection Time: 03/07/19  6:32 PM   Specimen: Nasopharyngeal Swab  Result Value Ref Range Status   SARS Coronavirus 2 POSITIVE (A) NEGATIVE Final    Comment: RESULT CALLED TO, READ BACK BY AND VERIFIED WITH: WOODS B, RN AT 0348 ON 03/08/2019 BY SAINVILUS S (NOTE) SARS-CoV-2 target nucleic acids are DETECTED. The SARS-CoV-2 RNA is generally detectable in upper and lower respiratory specimens during the acute phase of infection. Positive results are indicative of active infection with SARS-CoV-2. Clinical  correlation with patient history and other diagnostic information is necessary to determine patient infection status. Positive results do  not rule out bacterial infection or co-infection with other viruses. The expected result is Negative. Fact Sheet for Patients: 03/10/2019 Fact Sheet for  Healthcare Providers: quierodirigir.comhttps://www.fda.gov/media/138095/download This test is not yet approved or cleared by the [place] FDA and  has been authorized for detection and/or diagnosis of SARS-CoV-2 by FDA under an Emergency Use Authorization (EUA). This EUA will remain  in effect (meaning this test can b e used) for the duration of the COVID-19 declaration under Section 564(b)(1) of  the Act, 21 U.S.C. section 360bbb-3(b)(1), unless the authorization is terminated or revoked sooner. Performed at [facility], [address] [place], [place] [address]     Radiology Reports Ct Head Wo Contrast  Result Date: 03/07/2019 CLINICAL DATA:  85 year old female with altered mental status. EXAM: CT HEAD WITHOUT CONTRAST CT CERVICAL SPINE WITHOUT CONTRAST TECHNIQUE: Multidetector CT imaging of the head and cervical spine was performed following the standard protocol without intravenous contrast. Multiplanar CT image reconstructions of the cervical spine were also generated. COMPARISON:  None. FINDINGS: CT HEAD FINDINGS Brain: Moderate to advanced age-related atrophy and chronic microvascular ischemic changes. There is no acute intracranial hemorrhage. No mass effect or midline shift. No extra-axial fluid collection. Vascular: No hyperdense vessel or unexpected calcification. Skull: Normal. Negative for fracture or focal lesion. Sinuses/Orbits: No acute finding. Other: None CT CERVICAL SPINE FINDINGS Alignment: No acute subluxation. Grade 1 C3-C4 anterolisthesis. Skull base and vertebrae: No acute fracture. Osteopenia. Soft tissues and spinal canal: No prevertebral fluid or swelling. No visible canal hematoma. Disc levels: Multilevel degenerative changes with endplate irregularity and disc space narrowing. Upper chest: Biapical subpleural scarring. There is a 5 mm right apical pneumothorax. This is new compared to the CT of 12/09/2016. Other: Bilateral carotid bulb calcified plaques. IMPRESSION: 1. No acute intracranial hemorrhage. Moderate to advanced age-related atrophy and chronic microvascular ischemic changes. 2. No acute/traumatic cervical spine pathology. Multilevel degenerative changes. 3. A 5 mm right apical pneumothorax. These results were called by telephone at the time of interpretation on 03/07/2019 at 4:29 pm to provider [facility] , who verbally acknowledged these results.  Electronically Signed   By: Elgie CollardArash  Radparvar M.D.   On: 03/07/2019 16:30   Ct Cervical Spine Wo Contrast  Result Date: 03/07/2019 CLINICAL DATA:  85 year old female with altered mental status. EXAM: CT HEAD WITHOUT CONTRAST CT CERVICAL SPINE WITHOUT CONTRAST TECHNIQUE: Multidetector CT imaging of the head and cervical spine was performed following the standard protocol without intravenous contrast. Multiplanar CT image reconstructions of the cervical spine were also generated. COMPARISON:  None. FINDINGS: CT HEAD FINDINGS Brain: Moderate to advanced age-related atrophy and chronic microvascular ischemic changes. There is no acute intracranial hemorrhage. No mass effect or midline shift. No extra-axial fluid collection. Vascular: No hyperdense vessel or unexpected calcification. Skull: Normal. Negative for fracture or focal lesion. Sinuses/Orbits: No acute finding. Other: None CT CERVICAL SPINE FINDINGS Alignment: No acute subluxation. Grade 1 C3-C4 anterolisthesis. Skull base and vertebrae: No acute fracture. Osteopenia. Soft tissues and spinal canal: No prevertebral fluid or swelling. No visible canal hematoma. Disc levels: Multilevel degenerative changes with endplate irregularity and disc space narrowing. Upper chest: Biapical subpleural scarring. There is a 5 mm right apical pneumothorax. This is new compared to the CT of 12/09/2016. Other: Bilateral carotid bulb calcified plaques. IMPRESSION: 1. No acute intracranial hemorrhage. Moderate to advanced age-related atrophy and chronic microvascular ischemic changes. 2. No acute/traumatic cervical spine pathology. Multilevel degenerative changes. 3. A 5 mm right apical pneumothorax. These results were called by telephone at the time of interpretation on 03/07/2019 at 4:29 pm to provider [facility] , who verbally acknowledged these results.  Electronically Signed   By: Elgie Collard M.D.   On: 03/07/2019 16:30   Ct Abdomen Pelvis W Contrast  Result Date:  03/05/2019 CLINICAL DATA:  Upper abdominal pain, diarrhea, decreased appetite EXAM: CT ABDOMEN AND PELVIS WITH CONTRAST TECHNIQUE: Multidetector CT imaging of the abdomen and pelvis was performed using the standard protocol following bolus administration of intravenous contrast. CONTRAST:  75mL OMNIPAQUE IOHEXOL 300 MG/ML  SOLN COMPARISON:  12/20/2012 FINDINGS: Technical note: Examination is degraded by patient motion artifact. Additional metallic streak/beam hardening artifact from right total hip arthroplasty hardware degrades evaluation of the deep pelvis. Lower chest: Trace right pleural effusion with right basilar atelectasis and small dependent areas of airspace consolidation. Left lung base clear. Coronary artery calcifications. Hepatobiliary: No focal liver abnormality is seen. Status post cholecystectomy. No biliary dilatation. Pancreas: Grossly unremarkable. Spleen: Normal in size without focal abnormality. Adrenals/Urinary Tract: Adrenal glands and bilateral kidneys appear grossly unremarkable. Urinary bladder is partially obscured by artifact. No discrete abnormality. Stomach/Bowel: Negative for bowel obstruction. Small volume liquid stool within bowel suggesting diarrheal state. No definite bowel thickening or inflammatory changes are evident within the limitations of this exam. Vascular/Lymphatic: Aortic atherosclerosis. No enlarged abdominal or pelvic lymph nodes. Reproductive: No appreciable abnormality. No adnexal mass. Other: No ascites. No abdominal wall hernia. Musculoskeletal: Partially visualized right total hip arthroplasty hardware. Degenerative disc disease of the lumbar spine. There is a dextroscoliotic curvature of the lumbar spine. No acute osseous findings are evident. IMPRESSION: 1. Somewhat limited exam.  No acute abdominopelvic findings. 2. Trace right pleural effusion with right basilar atelectasis and small dependent areas of airspace consolidation. 3. Status post cholecystectomy.  4. Aortic atherosclerosis and coronary artery calcifications. Electronically Signed   By: Duanne Guess M.D.   On: 03/05/2019 17:17   Dg Chest Port 1 View  Result Date: 03/08/2019 CLINICAL DATA:  COVID positive, evaluate for pneumothorax EXAM: PORTABLE CHEST 1 VIEW COMPARISON:  2018 FINDINGS: Chronic mild interstitial prominence. No pleural effusion or pneumothorax. Stable cardiomediastinal contours with normal heart size. Calcified plaque along the thoracic aorta. IMPRESSION: No pneumothorax. Electronically Signed   By: Guadlupe Spanish M.D.   On: 03/08/2019 08:35   Dg Chest Portable 1 View  Result Date: 03/07/2019 CLINICAL DATA:  Fever and weakness. EXAM: PORTABLE CHEST 1 VIEW COMPARISON:  Chest x-ray dated December 09, 2016. FINDINGS: The heart size and mediastinal contours are within normal limits. Atherosclerotic calcification of the thoracic aorta. Normal pulmonary vascularity. No focal consolidation, pleural effusion, or pneumothorax. No acute osseous abnormality. IMPRESSION: No active disease. Electronically Signed   By: Obie Dredge M.D.   On: 03/07/2019 15:59    Signature  Susa Raring M.D on 03/16/2019 at 9:12 AM   -  To page go to [email or web address]

## 2019-03-16 NOTE — Plan of Care (Signed)
Discussed with patient plan of care for the evening, pain management and keeping her with warm blankets with some teach back displayed at this time.

## 2019-03-16 NOTE — TOC Progression Note (Addendum)
Transition of Care [facility]) - Progression Note    Patient Details  Name: Yvonne Edwards MRN: [id number] Date of Birth: Sep 13, 1918  Transition of Care [facility]) CM/SW Contact  Ihor Gully, LCSW Phone Number: 03/16/2019, 1:42 PM  Clinical Narrative:    TOC spoke with patient's POA, Havoline Gilliam regarding discharge plan. Ms. Arsenio Loader remains unsure as to whether she wants patient to go to [facility], SNF with hospice (patient is comfort care/not rehab), or return to [facility] (if they accept their Covid+ patient's back). Message left for [place] at [facility] in [place] requesting return contact to identify if patient could return to facility.  Spoke with Linna Hoff at [facility] in [place] who indicated that they did not accept Covid + patient's in the hospice home at this time.  Ms. Arsenio Loader stated that she will await to for [facility] to hear back from [facility] before she makes her decision regarding discharge venue.  TOC will follow up with Ms. Arsenio Loader once it is confirmed that she can or cannot return to [place].    Expected Discharge Plan: [place] Barriers to Discharge: Hospice Bed not available, No SNF bed, Other (comment)(COVID positive Hospice Placement)  Expected Discharge Plan and Services Expected Discharge Plan: [place] In-house Referral: Clinical Social Work, Hospice / [place] Acute Care Choice: [place] arrangements for the past 2 months: Assisted Living Facility([facility])                                       Social Determinants of Health (SDOH) Interventions    Readmission Risk Interventions No flowsheet data found.

## 2019-03-17 NOTE — Progress Notes (Signed)
PROGRESS NOTE                                                                                                                                                                                                             Patient Demographics:    Yvonne MerinoHelen Edwards, is a 85 y.o. female, DOB - [birth year], ZOX:[id number]  Admit date - 03/08/2019   Admitting Physician Lorretta HarpXilin Niu, MD  Outpatient Primary MD for the patient is Yvonne ReichmannHande, Vishwanath, MD  LOS - 8   No chief complaint on file.      Brief Narrative    85 y.o.femalewith medical history significant ofbilateral hearing loss,age-related macular degeneration, hyperlipidemia, chronic diarrheawho was sent to ED byher assisted living facility for concerns of fever, weakness and increased confusion, her work-up was significant for UTI, as well she did test positive for COVID-19 pneumonia, so she was transferred to [facility] for further management.    Subjective:   Patient in bed in no discomfort slightly more responsive today tracked me in the room and shook my hand, still unable to answer questions or follow commands reliably.   Assessment  & Plan :     COVID-19 virus infection -Covid 19+ [facility]. -So far appears to be asymptomatic with no oxygen requirement.  No active cough or respiratory distress. CXR with no infiltration for pneumonia.  -Continue with zinc and vitamin C-Continue to monitor inflammatory markers, CRP is trending down which is reassuring.  COVID-19 Labs  No results for input(s): DDIMER, FERRITIN, LDH, CRP in the last 72 hours.  Lab Results  Component Value Date   SARSCOV2NAA POSITIVE (A) 03/07/2019     Altered mental status:  - acute metabolic encephalopathy in the setting of infectious process due to UTI and COVID-19 infection. -Likely due to UTI, CT head with no acute findings. -Status appears to be improving, but slowly, her mental status is difficult to assess, as  she is extremely hard of hearing, and speak with very soft sound.  Hypophosphatemia -Repleted.  UTI -Culture growing Klebsiella, treated with IV Rocephin  Right 5 mm apical pneumothorax: -  Incidental finding on CT spine. Patient is not hypoxic and not tachypneic on exam. The repeated CXR is negative for pneumothorax  Hypokalemia:  -Repleted  Chronic diarrhea and mild AP:  - pt is on immodium for this.  CT of abdomen is unremarkable. -Hold Imodium until C diff negative -Hold laxatives   Hyperlipidemia -Discontinue rosuvastatin as she is comfort care  Dementia -Continue mirtazapine  Protein calorie malnutrition: Moderate. /Failure to thrive -consulted dietitian.  her oral intake remains very poor, would not eat any of her food, she barely tolerated few sips of supplements.  Pushes food and nutrition out, palliative care consulted, they discussed with [facility] POA, and is extremely frail, with no oral intake, aggressive measures, current goals are comfort, social worker consulted to see if they can arrange for [place] hospice .    Code Status : DNR/Comfort  Family Communication  : Patient has no alive  family members, previous MD had discussed with caregiver Vita Barley, her HCPOA.  Disposition Plan  : Need hospice placement, care management consulted for [place] hospice to evaluate  Consults  : palliative Medicine  Procedures  : None  DVT Prophylaxis  : Comfort   Lab Results  Component Value Date   PLT 245 03/13/2019    Antibiotics  :    Anti-infectives (From admission, onward)   Start     Dose/Rate Route Frequency Ordered Stop   03/08/19 2200  cefTRIAXone (ROCEPHIN) 1 g in sodium chloride 0.9 % 100 mL IVPB     1 g 200 mL/hr over 30 Minutes Intravenous Every 24 hours 03/08/19 1925 03/12/19 0700        Objective:   Vitals:   03/15/19 2200 03/16/19 0713 03/16/19 1944 03/17/19 0754  BP: (!) 151/83 (!) 114/55 (!) 103/56 (!) 111/59  Pulse:  90 93 93   Resp: 16 19 18 16   Temp: 98.3 F (36.8 C) 97.9 F (36.6 C) 98 F (36.7 C) 98.1 F (36.7 C)  TempSrc: Oral Axillary Oral Axillary  SpO2: 93% 94% 92% 95%  Weight:      Height:        Wt Readings from Last 3 Encounters:  03/09/19 54.4 kg  03/07/19 47 kg  03/05/19 47 kg     Intake/Output Summary (Last 24 hours) at 03/17/2019 0908 Last data filed at 03/17/2019 1245 Gross per 24 hour  Intake 250 ml  Output 325 ml  Net -75 ml     Physical Exam  Frail elderly white female lying in hospital bed in no distress, will not answer questions or follow commands, [place].AT  Supple Neck,No JVD, No cervical lymphadenopathy appriciated.  Symmetrical Chest wall movement, Good air movement bilaterally, CTAB RRR,No Gallops, Rubs or new Murmurs, No Parasternal Heave +ve B.Sounds, Abd Soft, No tenderness, No organomegaly appriciated, No rebound - guarding or rigidity. No Cyanosis, Clubbing or edema, No new Rash or bruise     Data Review:    CBC Recent Labs  Lab 03/11/19 0405 03/12/19 0545 03/13/19 0135  WBC 4.1 5.7 5.5  HGB 13.3 14.0 13.4  HCT 41.2 44.0 41.9  PLT 228 229 245  MCV 90.4 90.2 91.1  MCH 29.2 28.7 29.1  MCHC 32.3 31.8 32.0  RDW 12.9 12.8 12.9  LYMPHSABS 0.8 0.7 1.2  MONOABS 0.7 0.7 0.9  EOSABS 0.1 0.1 0.2  BASOSABS 0.0 0.0 0.1    Chemistries  Recent Labs  Lab 03/11/19 0405 03/12/19 0545 03/13/19 0135  NA 141 139 139  K 4.0 4.2 4.8  CL 105 100 99  CO2 29 29 30   GLUCOSE 110* 114* 90  BUN 20 15 15   CREATININE 0.60 0.66 0.74  CALCIUM 8.2* 8.4* 8.6*  AST 33 50* 55*  ALT 20 31 32  ALKPHOS 62 70 77  BILITOT 0.8 0.8 0.5   ------------------------------------------------------------------------------------------------------------------ No results for input(s): CHOL, HDL, LDLCALC, TRIG, CHOLHDL, LDLDIRECT in the last 72 hours.  Lab Results  Component Value Date   HGBA1C 5.9 (H) 12/05/2016    ------------------------------------------------------------------------------------------------------------------ No results for input(s): TSH, T4TOTAL, T3FREE, THYROIDAB in the last 72 hours.  Invalid input(s): FREET3 ------------------------------------------------------------------------------------------------------------------ No results for input(s): VITAMINB12, FOLATE, FERRITIN, TIBC, IRON, RETICCTPCT in the last 72 hours.  Coagulation profile No results for input(s): INR, PROTIME in the last 168 hours.  No results for input(s): DDIMER in the last 72 hours.  Cardiac Enzymes No results for input(s): CKMB, TROPONINI, MYOGLOBIN in the last 168 hours.  Invalid input(s): CK ------------------------------------------------------------------------------------------------------------------    Component Value Date/Time   BNP 134.0 (H) 03/08/2019 0553    Inpatient Medications  Scheduled Meds:  feeding supplement (ENSURE ENLIVE)  237 mL Oral TID BM   mouth rinse  15 mL Mouth Rinse BID   mirtazapine  7.5 mg Oral QHS   sodium chloride flush  3 mL Intravenous Q12H   Continuous Infusions:  sodium chloride     PRN Meds:.sodium chloride, acetaminophen, guaiFENesin-dextromethorphan, haloperidol, morphine CONCENTRATE, ondansetron **OR** ondansetron (ZOFRAN) IV, sodium chloride flush  Micro Results Recent Results (from the past 240 hour(s))  Urine culture     Status: Abnormal   Collection Time: 03/07/19  3:26 PM   Specimen: Urine, Catheterized  Result Value Ref Range Status   Specimen Description   Final    URINE, CATHETERIZED Performed at [facility], [address] [place], [place] [address]    Special Requests   Final    NONE Performed at [facility], [address] [place], [place] [address]    Culture >=100,000 COLONIES/mL KLEBSIELLA PNEUMONIAE (A)  Final   Report Status 03/09/2019 FINAL  Final   Organism ID, Bacteria KLEBSIELLA PNEUMONIAE  (A)  Final      Susceptibility   Klebsiella pneumoniae - MIC*    AMPICILLIN >=32 RESISTANT Resistant     CEFAZOLIN <=4 SENSITIVE Sensitive     CEFTRIAXONE <=1 SENSITIVE Sensitive     CIPROFLOXACIN <=0.25 SENSITIVE Sensitive     GENTAMICIN <=1 SENSITIVE Sensitive     IMIPENEM <=0.25 SENSITIVE Sensitive     NITROFURANTOIN 64 INTERMEDIATE Intermediate     TRIMETH/SULFA <=20 SENSITIVE Sensitive     AMPICILLIN/SULBACTAM 4 SENSITIVE Sensitive     PIP/TAZO 8 SENSITIVE Sensitive     Extended ESBL NEGATIVE Sensitive     * >=100,000 COLONIES/mL KLEBSIELLA PNEUMONIAE  SARS CORONAVIRUS 2 (TAT 6-24 HRS) Nasopharyngeal Nasopharyngeal Swab     Status: Abnormal   Collection Time: 03/07/19  6:32 PM   Specimen: Nasopharyngeal Swab  Result Value Ref Range Status   SARS Coronavirus 2 POSITIVE (A) NEGATIVE Final    Comment: RESULT CALLED TO, READ BACK BY AND VERIFIED WITH: WOODS B, RN AT 0348 ON 03/08/2019 BY SAINVILUS S (NOTE) SARS-CoV-2 target nucleic acids are DETECTED. The SARS-CoV-2 RNA is generally detectable in upper and lower respiratory specimens during the acute phase of infection. Positive results are indicative of active infection with SARS-CoV-2. Clinical  correlation with patient history and other diagnostic information is necessary to determine patient infection status. Positive results do  not rule out bacterial infection or co-infection with other viruses. The expected result is Negative. Fact Sheet for Patients: HairSlick.no Fact Sheet for Healthcare Providers: quierodirigir.com This test is not yet approved or cleared by the [place] FDA and  has been authorized for  detection and/or diagnosis of SARS-CoV-2 by FDA under an Emergency Use Authorization (EUA). This EUA will remain  in effect (meaning this test can b e used) for the duration of the COVID-19 declaration under Section 564(b)(1) of the Act, 21 U.S.C. section  360bbb-3(b)(1), unless the authorization is terminated or revoked sooner. Performed at [facility], [address] [place], [place] [address]     Radiology Reports Ct Head Wo Contrast  Result Date: 03/07/2019 CLINICAL DATA:  85 year old female with altered mental status. EXAM: CT HEAD WITHOUT CONTRAST CT CERVICAL SPINE WITHOUT CONTRAST TECHNIQUE: Multidetector CT imaging of the head and cervical spine was performed following the standard protocol without intravenous contrast. Multiplanar CT image reconstructions of the cervical spine were also generated. COMPARISON:  None. FINDINGS: CT HEAD FINDINGS Brain: Moderate to advanced age-related atrophy and chronic microvascular ischemic changes. There is no acute intracranial hemorrhage. No mass effect or midline shift. No extra-axial fluid collection. Vascular: No hyperdense vessel or unexpected calcification. Skull: Normal. Negative for fracture or focal lesion. Sinuses/Orbits: No acute finding. Other: None CT CERVICAL SPINE FINDINGS Alignment: No acute subluxation. Grade 1 C3-C4 anterolisthesis. Skull base and vertebrae: No acute fracture. Osteopenia. Soft tissues and spinal canal: No prevertebral fluid or swelling. No visible canal hematoma. Disc levels: Multilevel degenerative changes with endplate irregularity and disc space narrowing. Upper chest: Biapical subpleural scarring. There is a 5 mm right apical pneumothorax. This is new compared to the CT of 12/09/2016. Other: Bilateral carotid bulb calcified plaques. IMPRESSION: 1. No acute intracranial hemorrhage. Moderate to advanced age-related atrophy and chronic microvascular ischemic changes. 2. No acute/traumatic cervical spine pathology. Multilevel degenerative changes. 3. A 5 mm right apical pneumothorax. These results were called by telephone at the time of interpretation on 03/07/2019 at 4:29 pm to provider [facility] , who verbally acknowledged these results. Electronically Signed   By:  Elgie Collard M.D.   On: 03/07/2019 16:30   Ct Cervical Spine Wo Contrast  Result Date: 03/07/2019 CLINICAL DATA:  85 year old female with altered mental status. EXAM: CT HEAD WITHOUT CONTRAST CT CERVICAL SPINE WITHOUT CONTRAST TECHNIQUE: Multidetector CT imaging of the head and cervical spine was performed following the standard protocol without intravenous contrast. Multiplanar CT image reconstructions of the cervical spine were also generated. COMPARISON:  None. FINDINGS: CT HEAD FINDINGS Brain: Moderate to advanced age-related atrophy and chronic microvascular ischemic changes. There is no acute intracranial hemorrhage. No mass effect or midline shift. No extra-axial fluid collection. Vascular: No hyperdense vessel or unexpected calcification. Skull: Normal. Negative for fracture or focal lesion. Sinuses/Orbits: No acute finding. Other: None CT CERVICAL SPINE FINDINGS Alignment: No acute subluxation. Grade 1 C3-C4 anterolisthesis. Skull base and vertebrae: No acute fracture. Osteopenia. Soft tissues and spinal canal: No prevertebral fluid or swelling. No visible canal hematoma. Disc levels: Multilevel degenerative changes with endplate irregularity and disc space narrowing. Upper chest: Biapical subpleural scarring. There is a 5 mm right apical pneumothorax. This is new compared to the CT of 12/09/2016. Other: Bilateral carotid bulb calcified plaques. IMPRESSION: 1. No acute intracranial hemorrhage. Moderate to advanced age-related atrophy and chronic microvascular ischemic changes. 2. No acute/traumatic cervical spine pathology. Multilevel degenerative changes. 3. A 5 mm right apical pneumothorax. These results were called by telephone at the time of interpretation on 03/07/2019 at 4:29 pm to provider [facility] , who verbally acknowledged these results. Electronically Signed   By: Elgie Collard M.D.   On: 03/07/2019 16:30   Ct Abdomen Pelvis W Contrast  Result Date: 03/05/2019 CLINICAL DATA:   Upper abdominal pain, diarrhea, decreased appetite EXAM: CT ABDOMEN AND PELVIS WITH CONTRAST TECHNIQUE: Multidetector CT imaging of the abdomen and pelvis was performed using the standard protocol following bolus administration of intravenous contrast. CONTRAST:  18mL OMNIPAQUE IOHEXOL 300 MG/ML  SOLN COMPARISON:  12/20/2012 FINDINGS: Technical note: Examination is degraded by patient motion artifact. Additional metallic streak/beam hardening artifact from right total hip arthroplasty hardware degrades evaluation of the deep pelvis. Lower chest: Trace right pleural effusion with right basilar atelectasis and small dependent areas of airspace consolidation. Left lung base clear. Coronary artery calcifications. Hepatobiliary: No focal liver abnormality is seen. Status post cholecystectomy. No biliary dilatation. Pancreas: Grossly unremarkable. Spleen: Normal in size without focal abnormality. Adrenals/Urinary Tract: Adrenal glands and bilateral kidneys appear grossly unremarkable. Urinary bladder is partially obscured by artifact. No discrete abnormality. Stomach/Bowel: Negative for bowel obstruction. Small volume liquid stool within bowel suggesting diarrheal state. No definite bowel thickening or inflammatory changes are evident within the limitations of this exam. Vascular/Lymphatic: Aortic atherosclerosis. No enlarged abdominal or pelvic lymph nodes. Reproductive: No appreciable abnormality. No adnexal mass. Other: No ascites. No abdominal wall hernia. Musculoskeletal: Partially visualized right total hip arthroplasty hardware. Degenerative disc disease of the lumbar spine. There is a dextroscoliotic curvature of the lumbar spine. No acute osseous findings are evident. IMPRESSION: 1. Somewhat limited exam.  No acute abdominopelvic findings. 2. Trace right pleural effusion with right basilar atelectasis and small dependent areas of airspace consolidation. 3. Status post cholecystectomy. 4. Aortic atherosclerosis  and coronary artery calcifications. Electronically Signed   By: Duanne Guess M.D.   On: 03/05/2019 17:17   Dg Chest Port 1 View  Result Date: 03/08/2019 CLINICAL DATA:  COVID positive, evaluate for pneumothorax EXAM: PORTABLE CHEST 1 VIEW COMPARISON:  2018 FINDINGS: Chronic mild interstitial prominence. No pleural effusion or pneumothorax. Stable cardiomediastinal contours with normal heart size. Calcified plaque along the thoracic aorta. IMPRESSION: No pneumothorax. Electronically Signed   By: Guadlupe Spanish M.D.   On: 03/08/2019 08:35   Dg Chest Portable 1 View  Result Date: 03/07/2019 CLINICAL DATA:  Fever and weakness. EXAM: PORTABLE CHEST 1 VIEW COMPARISON:  Chest x-ray dated December 09, 2016. FINDINGS: The heart size and mediastinal contours are within normal limits. Atherosclerotic calcification of the thoracic aorta. Normal pulmonary vascularity. No focal consolidation, pleural effusion, or pneumothorax. No acute osseous abnormality. IMPRESSION: No active disease. Electronically Signed   By: Obie Dredge M.D.   On: 03/07/2019 15:59    Signature  Susa Raring M.D on 03/17/2019 at 9:08 AM   -  To page go to [email or web address]

## 2019-03-18 NOTE — TOC Progression Note (Addendum)
Transition of Care [facility]) - Progression Note    Patient Details  Name: Yvonne Edwards MRN: [id number] Date of Birth: 25-May-1918  Transition of Care [facility]) CM/SW Contact  Joaquin Courts, RN Phone Number: 03/18/2019, 3:15 PM  Clinical Narrative:    CM received call from [facility] rep stating patient does not meet criteria for symptom management to warrant a residential hospice admission at this time. This was communicated to the [facility], who elected for patient to return to [place] with hospice services.  CM spoke with Nanine Means rep who confirmed patient can return with hospice services. CM reached out to [facility] rep, Audrea Muscat regarding referral and need for equipment.   Expected Discharge Plan: [place] Barriers to Discharge: Hospice Bed not available, No SNF bed, Other (comment)(COVID positive Hospice Placement)  Expected Discharge Plan and Services Expected Discharge Plan: [place] In-house Referral: Clinical Social Work, Hospice / [place] Acute Care Choice: [place] arrangements for the past 2 months: Assisted Living Facility([facility])                                       Social Determinants of Health (SDOH) Interventions    Readmission Risk Interventions No flowsheet data found.

## 2019-03-18 NOTE — Plan of Care (Signed)
Discussed in front of patient and family via phone plan of care for the evening, pain management and updated family with no further questions at this time.

## 2019-03-18 NOTE — TOC Progression Note (Signed)
Transition of Care [facility]) - Progression Note    Patient Details  Name: Yvonne Edwards MRN: [id number] Date of Birth: 02/15/1919  Transition of Care [facility]) CM/SW Contact  Joaquin Courts, RN Phone Number: 03/18/2019, 12:39 PM  Clinical Narrative:    CM spoke with [place]ie [place] regarding discharge planning. HCPOA would like for patient to go to residential hospice at [place].  CM gave referral to [facility] hospice rep and will await response to see if patient is eligible for residential bed.     Expected Discharge Plan: [place] Barriers to Discharge: Hospice Bed not available, No SNF bed, Other (comment)(COVID positive Hospice Placement)  Expected Discharge Plan and Services Expected Discharge Plan: [place] In-house Referral: Clinical Social Work, Hospice / [place] Acute Care Choice: [place] arrangements for the past 2 months: Assisted Living Facility([facility] )                                       Social Determinants of Health (SDOH) Interventions    Readmission Risk Interventions No flowsheet data found.

## 2019-03-18 NOTE — Progress Notes (Signed)
Hydrologist [facility])  Hospital Liaison: RN note   Notified by Transition of [place] of patient/family request for [facility] services at [facility] AL[facility] after discharge. Chart and patient information under review by [facility] physician. Hospice eligibility pending currently.     Writer spoke with HCPOA [place] to initiate education related to hospice philosophy, services and team approach to care.           verbalized understanding of information given.   Please send signed and completed DNR form home with patient/family. Patient will need prescriptions for discharge comfort medications.    DME needs have been discussed with [facility], patient currently has the following equipment in the home: none.  [facility]  requests the following DME for delivery to the home: Hospital Bed, W/C.   Please call with any hospice related questions.   Thank you for this referral.    Farrel Gordon, RN, CCM [place] (listed on AMION under Hospice and [facility]) [phone]

## 2019-03-18 NOTE — Progress Notes (Signed)
PROGRESS NOTE                                                                                                                                                                                                             Patient Demographics:    Yvonne Edwards, is a 85 y.o. female, DOB - 1918/05/19, FXT:[id number]  Admit date - 03/08/2019   Admitting Physician Ivor Costa, MD  Outpatient Primary MD for the patient is Tracie Harrier, MD  LOS - 9   No chief complaint on file.      Brief Narrative    85 y.o.femalewith medical history significant ofbilateral hearing loss,age-related macular degeneration, hyperlipidemia, chronic diarrheawho was sent to ED byher assisted living facility for concerns of fever, weakness and increased confusion, her work-up was significant for UTI, as well she did test positive for COVID-19 pneumonia, so she was transferred to [facility] for further management.    Subjective:   Frail elderly lady, laying in hospital bed in no discomfort.  Unable to answer questions reliably.   Assessment  & Plan :     COVID-19 virus infection -Covid 19+ on admission to [facility]. -So far appears to be asymptomatic with no oxygen requirement.  No active cough or respiratory distress. CXR with no infiltration for pneumonia.  -Continue with zinc and vitamin C-Continue to monitor inflammatory markers, CRP is trending down which is reassuring.  COVID-19 Labs  No results for input(s): DDIMER, FERRITIN, LDH, CRP in the last 72 hours.  Lab Results  Component Value Date   SARSCOV2NAA POSITIVE (A) 03/07/2019     Altered mental status:  - acute metabolic encephalopathy in the setting of infectious process due to UTI and COVID-19 infection. -Likely due to UTI, CT head with no acute findings. -Status appears to be improving, but slowly, her mental status is difficult to assess, as she is extremely hard of hearing, and speak with very soft sound.  Hypophosphatemia -Repleted.  UTI -Culture growing Klebsiella, treated with IV Rocephin  Right 5 mm apical pneumothorax: -  Incidental finding on CT spine. Patient is not hypoxic and not tachypneic on exam. The repeated CXR is negative for pneumothorax  Hypokalemia:  -Repleted  Chronic diarrhea and mild AP:  - pt is on immodium for this. CT of abdomen is unremarkable. -Hold Imodium until C diff negative -Hold laxatives  Hyperlipidemia -Discontinue rosuvastatin as she is comfort care  Dementia -Continue mirtazapine  Protein calorie malnutrition: Moderate. /Failure to thrive -consulted dietitian.  her oral intake remains very poor, would not eat any of her food, she barely tolerated few sips of supplements.  Pushes food and nutrition out, palliative care consulted, they discussed with [facility], and is extremely frail, with no oral intake, aggressive measures, current goals are comfort, social worker consulted to see if they can arrange for [facility] .    Code Status : DNR/Comfort  Family Communication  : Patient has no alive  family members, previous MD had discussed with caregiver [name], her HCPOA.  Disposition Plan  : Await hospice bed  Consults  : palliative Medicine  Procedures  : None  DVT Prophylaxis  : Comfort   Lab Results  Component Value Date   PLT 245 03/13/2019    Antibiotics  :    Anti-infectives (From admission, onward)   Start     Dose/Rate Route Frequency Ordered Stop   03/08/19 2200  cefTRIAXone (ROCEPHIN) 1 g in sodium chloride 0.9 % 100 mL IVPB     1 g 200 mL/hr over 30 Minutes Intravenous Every 24 hours 03/08/19 1925 03/12/19 0700        Objective:   Vitals:   03/17/19 0754 03/17/19 1900 03/18/19 0606 03/18/19 0835  BP: (!) 111/59 116/62 (!) 157/82 112/85  Pulse: 93 94  97  Resp: (!) 32  Temp: 98.1 F (36.7 C) 97.9 F (36.6 C) (!) 96.9 F (36.1 C) 97.9 F (36.6 C)  TempSrc: Axillary Axillary Axillary  Oral  SpO2: 95% 95% 96% 95%  Weight:      Height:        Wt Readings from Last 3 Encounters:  03/09/19 54.4 kg  03/07/19 47 kg  03/05/19 47 kg     Intake/Output Summary (Last 24 hours) at 03/18/2019 1610 Last data filed at 03/18/2019 9604 Gross per 24 hour  Intake 70 ml  Output 100 ml  Net -30 ml     Physical Exam  Frail elderly white female lying in hospital bed in no distress, will not answer questions or follow commands, [place].AT  Supple Neck,No JVD, No cervical lymphadenopathy appriciated.  Symmetrical Chest wall movement, Good air movement bilaterally, CTAB RRR,No Gallops, Rubs or new Murmurs, No Parasternal Heave +ve B.Sounds, Abd Soft, No tenderness, No organomegaly appriciated, No rebound - guarding or rigidity. No Cyanosis, Clubbing or edema, No new Rash or bruise     Data Review:    CBC Recent Labs  Lab 03/12/19 0545 03/13/19 0135  WBC 5.7 5.5  HGB 14.0 13.4  HCT 44.0 41.9  PLT 229 245  MCV 90.2 91.1  MCH 28.7 29.1  MCHC 31.8 32.0  RDW 12.8 12.9  LYMPHSABS 0.7 1.2  MONOABS 0.7 0.9  EOSABS 0.1 0.2  BASOSABS 0.0 0.1    Chemistries  Recent Labs  Lab 03/12/19 0545 03/13/19 0135  NA 139 139  K 4.2 4.8  CL 100 99  CO2 29 30  GLUCOSE 114* 90  BUN 15 15  CREATININE 0.66 0.74  CALCIUM 8.4* 8.6*  AST 50* 55*  ALT 31 32  ALKPHOS 70 77  BILITOT 0.8 0.5   ------------------------------------------------------------------------------------------------------------------ No results for input(s): CHOL, HDL, LDLCALC, TRIG, CHOLHDL, LDLDIRECT in the last 72 hours.  Lab Results  Component Value Date   HGBA1C 5.9 (H) 12/05/2016   ------------------------------------------------------------------------------------------------------------------ No results for input(s): TSH, T4TOTAL, T3FREE, THYROIDAB  in the last 72 hours.  Invalid input(s): FREET3  ------------------------------------------------------------------------------------------------------------------ No results for input(s): VITAMINB12, FOLATE, FERRITIN, TIBC, IRON, RETICCTPCT in the last 72 hours.  Coagulation profile No results for input(s): INR, PROTIME in the last 168 hours.  No results for input(s): DDIMER in the last 72 hours.  Cardiac Enzymes No results for input(s): CKMB, TROPONINI, MYOGLOBIN in the last 168 hours.  Invalid input(s): CK ------------------------------------------------------------------------------------------------------------------    Component Value Date/Time   BNP 134.0 (H) 03/08/2019 0553    Inpatient Medications  Scheduled Meds: . feeding supplement (ENSURE ENLIVE)  237 mL Oral TID BM  . mouth rinse  15 mL Mouth Rinse BID  . mirtazapine  7.5 mg Oral QHS  . sodium chloride flush  3 mL Intravenous Q12H   Continuous Infusions: . sodium chloride     PRN Meds:.sodium chloride, acetaminophen, guaiFENesin-dextromethorphan, haloperidol, morphine CONCENTRATE, ondansetron **OR** ondansetron (ZOFRAN) IV, sodium chloride flush  Micro Results No results found for this or any previous visit (from the past 240 hour(s)).  Radiology Reports Ct Head Wo Contrast  Result Date: 03/07/2019 CLINICAL DATA:  85 year old female with altered mental status. EXAM: CT HEAD WITHOUT CONTRAST CT CERVICAL SPINE WITHOUT CONTRAST TECHNIQUE: Multidetector CT imaging of the head and cervical spine was performed following the standard protocol without intravenous contrast. Multiplanar CT image reconstructions of the cervical spine were also generated. COMPARISON:  None. FINDINGS: CT HEAD FINDINGS Brain: Moderate to advanced age-related atrophy and chronic microvascular ischemic changes. There is no acute intracranial hemorrhage. No mass effect or midline shift. No extra-axial fluid collection. Vascular: No hyperdense vessel or unexpected calcification. Skull: Normal.  Negative for fracture or focal lesion. Sinuses/Orbits: No acute finding. Other: None CT CERVICAL SPINE FINDINGS Alignment: No acute subluxation. Grade 1 C3-C4 anterolisthesis. Skull base and vertebrae: No acute fracture. Osteopenia. Soft tissues and spinal canal: No prevertebral fluid or swelling. No visible canal hematoma. Disc levels: Multilevel degenerative changes with endplate irregularity and disc space narrowing. Upper chest: Biapical subpleural scarring. There is a 5 mm right apical pneumothorax. This is new compared to the CT of 12/09/2016. Other: Bilateral carotid bulb calcified plaques. IMPRESSION: 1. No acute intracranial hemorrhage. Moderate to advanced age-related atrophy and chronic microvascular ischemic changes. 2. No acute/traumatic cervical spine pathology. Multilevel degenerative changes. 3. A 5 mm right apical pneumothorax. These results were called by telephone at the time of interpretation on 03/07/2019 at 4:29 pm to provider [facility] , who verbally acknowledged these results. Electronically Signed   By: Elgie Collard M.D.   On: 03/07/2019 16:30   Ct Cervical Spine Wo Contrast  Result Date: 03/07/2019 CLINICAL DATA:  85 year old female with altered mental status. EXAM: CT HEAD WITHOUT CONTRAST CT CERVICAL SPINE WITHOUT CONTRAST TECHNIQUE: Multidetector CT imaging of the head and cervical spine was performed following the standard protocol without intravenous contrast. Multiplanar CT image reconstructions of the cervical spine were also generated. COMPARISON:  None. FINDINGS: CT HEAD FINDINGS Brain: Moderate to advanced age-related atrophy and chronic microvascular ischemic changes. There is no acute intracranial hemorrhage. No mass effect or midline shift. No extra-axial fluid collection. Vascular: No hyperdense vessel or unexpected calcification. Skull: Normal. Negative for fracture or focal lesion. Sinuses/Orbits: No acute finding. Other: None CT CERVICAL SPINE FINDINGS Alignment:  No acute subluxation. Grade 1 C3-C4 anterolisthesis. Skull base and vertebrae: No acute fracture. Osteopenia. Soft tissues and spinal canal: No prevertebral fluid or swelling. No visible canal hematoma. Disc levels: Multilevel degenerative changes with endplate irregularity and disc space narrowing. Upper  chest: Biapical subpleural scarring. There is a 5 mm right apical pneumothorax. This is new compared to the CT of 12/09/2016. Other: Bilateral carotid bulb calcified plaques. IMPRESSION: 1. No acute intracranial hemorrhage. Moderate to advanced age-related atrophy and chronic microvascular ischemic changes. 2. No acute/traumatic cervical spine pathology. Multilevel degenerative changes. 3. A 5 mm right apical pneumothorax. These results were called by telephone at the time of interpretation on 03/07/2019 at 4:29 pm to provider [facility] , who verbally acknowledged these results. Electronically Signed   By: Elgie CollardArash  Radparvar M.D.   On: 03/07/2019 16:30   Ct Abdomen Pelvis W Contrast  Result Date: 03/05/2019 CLINICAL DATA:  Upper abdominal pain, diarrhea, decreased appetite EXAM: CT ABDOMEN AND PELVIS WITH CONTRAST TECHNIQUE: Multidetector CT imaging of the abdomen and pelvis was performed using the standard protocol following bolus administration of intravenous contrast. CONTRAST:  75mL OMNIPAQUE IOHEXOL 300 MG/ML  SOLN COMPARISON:  12/20/2012 FINDINGS: Technical note: Examination is degraded by patient motion artifact. Additional metallic streak/beam hardening artifact from right total hip arthroplasty hardware degrades evaluation of the deep pelvis. Lower chest: Trace right pleural effusion with right basilar atelectasis and small dependent areas of airspace consolidation. Left lung base clear. Coronary artery calcifications. Hepatobiliary: No focal liver abnormality is seen. Status post cholecystectomy. No biliary dilatation. Pancreas: Grossly unremarkable. Spleen: Normal in size without focal abnormality.  Adrenals/Urinary Tract: Adrenal glands and bilateral kidneys appear grossly unremarkable. Urinary bladder is partially obscured by artifact. No discrete abnormality. Stomach/Bowel: Negative for bowel obstruction. Small volume liquid stool within bowel suggesting diarrheal state. No definite bowel thickening or inflammatory changes are evident within the limitations of this exam. Vascular/Lymphatic: Aortic atherosclerosis. No enlarged abdominal or pelvic lymph nodes. Reproductive: No appreciable abnormality. No adnexal mass. Other: No ascites. No abdominal wall hernia. Musculoskeletal: Partially visualized right total hip arthroplasty hardware. Degenerative disc disease of the lumbar spine. There is a dextroscoliotic curvature of the lumbar spine. No acute osseous findings are evident. IMPRESSION: 1. Somewhat limited exam.  No acute abdominopelvic findings. 2. Trace right pleural effusion with right basilar atelectasis and small dependent areas of airspace consolidation. 3. Status post cholecystectomy. 4. Aortic atherosclerosis and coronary artery calcifications. Electronically Signed   By: Duanne GuessNicholas  Plundo M.D.   On: 03/05/2019 17:17   Dg Chest Port 1 View  Result Date: 03/08/2019 CLINICAL DATA:  COVID positive, evaluate for pneumothorax EXAM: PORTABLE CHEST 1 VIEW COMPARISON:  2018 FINDINGS: Chronic mild interstitial prominence. No pleural effusion or pneumothorax. Stable cardiomediastinal contours with normal heart size. Calcified plaque along the thoracic aorta. IMPRESSION: No pneumothorax. Electronically Signed   By: Guadlupe SpanishPraneil  Patel M.D.   On: 03/08/2019 08:35   Dg Chest Portable 1 View  Result Date: 03/07/2019 CLINICAL DATA:  Fever and weakness. EXAM: PORTABLE CHEST 1 VIEW COMPARISON:  Chest x-ray dated December 09, 2016. FINDINGS: The heart size and mediastinal contours are within normal limits. Atherosclerotic calcification of the thoracic aorta. Normal pulmonary vascularity. No focal consolidation,  pleural effusion, or pneumothorax. No acute osseous abnormality. IMPRESSION: No active disease. Electronically Signed   By: Obie DredgeWilliam T Derry M.D.   On: 03/07/2019 15:59    Signature  Susa RaringPrashant Arvie Bartholomew M.D on 03/18/2019 at 9:52 AM   -  To page go to [email or web address]

## 2019-03-19 MED ORDER — ALBUTEROL SULFATE HFA 108 (90 BASE) MCG/ACT IN AERS: 2.0000 | INHALATION_SPRAY | Freq: Four times a day (QID) | RESPIRATORY_TRACT | 0 refills | Status: AC | PRN

## 2019-03-19 NOTE — Progress Notes (Signed)
[facility] here to take patient back to [facility], called facility to let them know that patient was going to be on her way back, also confirmed that facility was aware that she is still Covid +, one bag with patient's belongings were given to [facility], vss at transfer, shift nurse Tracie to call primary contact in the morning to let them know of patient's return to [place].

## 2019-03-19 NOTE — NC FL2 (Addendum)
[place] MEDICAID FL2 LEVEL OF CARE SCREENING TOOL     IDENTIFICATION  Patient Name: Yvonne Edwards Birthdate: 02/05/1919 Sex: female Admission Date (Current Location): 03/08/2019  [facility] and [place] Number:  Producer, television/film/video and Address:         Provider Number: [id number]  Attending Physician Name and Address:  Leroy Sea, MD  Relative Name and Phone Number:  [name] [phone]    Current Level of Care: Hospital Recommended Level of Care: Assisted Living Facility Prior Approval Number:    Date Approved/Denied:   PASRR Number:    Discharge Plan: Other (Comment)(ALF)    Current Diagnoses: Patient Active Problem List   Diagnosis Date Noted  . Acute lower UTI 03/08/2019  . COVID-19 virus infection 03/08/2019  . Chronic diarrhea 03/08/2019  . UTI (urinary tract infection) 03/08/2019  . Dementia (HCC) 03/08/2019  . Acute metabolic encephalopathy 03/07/2019  . Hypokalemia 03/07/2019  . Protein calorie malnutrition (HCC) 03/07/2019  . Other pneumothorax 03/07/2019  . Overactive bladder 01/03/2017  . Chest pain 12/09/2016  . Age-related osteoporosis with current pathological fracture with routine healing 12/08/2016  . Hypoxia 12/08/2016  . S/P total hip arthroplasty 12/07/2016  . Hyponatremia 12/07/2016  . Hyperglycemia, unspecified 12/07/2016  . Thrombocytopenia (HCC) 12/07/2016  . Leukocytosis 12/07/2016  . MRSA carrier 12/07/2016  . Vitamin D deficiency 12/07/2016  . Hip fracture (HCC) 12/04/2016  . AMD (age related macular degeneration) 12/02/2015  . Bilateral hearing loss 12/02/2015  . Pure hypercholesterolemia 12/02/2015    Orientation RESPIRATION BLADDER Height & Weight     Self  Normal Incontinent Weight: 53.7 kg Height:  4' 9.99" (147.3 cm)  BEHAVIORAL SYMPTOMS/MOOD NEUROLOGICAL BOWEL NUTRITION STATUS      Incontinent Diet  AMBULATORY STATUS COMMUNICATION OF NEEDS Skin   Total Care Verbally Normal                        Personal Care Assistance Level of Assistance  Bathing, Feeding, Dressing Bathing Assistance: Maximum assistance Feeding assistance: Maximum assistance Dressing Assistance: Maximum assistance     Functional Limitations Info  Sight, Hearing, Speech Sight Info: Impaired Hearing Info: Adequate Speech Info: Adequate    SPECIAL CARE FACTORS FREQUENCY                       Contractures Contractures Info: Not present    Additional Factors Info  Code Status, Allergies, Psychotropic, Isolation Precautions Code Status Info: DNR Allergies info: biaxin, codeine, shellfish allergy Psychotropic Info: remeron   Isolation Precautions Info: Covid positive     Current Medications (03/19/2019):    Discharge Medications: Discharge Medications        Allergies as of 03/19/2019      Reactions   Biaxin [clarithromycin]    Codeine Other (See Comments)   Shellfish Allergy Nausea And Vomiting         Medication List    STOP taking these medications   acetaminophen 325 MG tablet Commonly known as: TYLENOL   calcium-vitamin D 500-200 MG-UNIT tablet Commonly known as: Oscal 500/200 D-3   cefTRIAXone 1 g in sodium chloride 0.9 % 100 mL   Cholecalciferol 100 MCG (4000 UT) Tabs   enoxaparin 30 MG/0.3ML injection Commonly known as: LOVENOX   ENSURE ENLIVE PO   methocarbamol 500 MG tablet Commonly known as: ROBAXIN   mirtazapine 7.5 MG tablet Commonly known as: REMERON   PRESERVISION AREDS 2 PO   rosuvastatin 10 MG tablet  Commonly known as: CRESTOR   vitamin C 500 MG tablet Commonly known as: ASCORBIC ACID   Zinc Sulfate 220 (50 Zn) MG Tabs     TAKE these medications   albuterol 108 (90 Base) MCG/ACT inhaler Commonly known as: VENTOLIN HFA Inhale 2 puffs into the lungs every 6 (six) hours as needed for wheezing or shortness of breath.      Relevant Imaging Results:  Relevant Lab Results:   Additional Information SSN  [id number]  Joaquin Courts, RN

## 2019-03-19 NOTE — Discharge Instructions (Signed)
Disposition.  Residential hospice °Condition.  Guarded °CODE STATUS.  DNR °Activity.  With assistance as tolerated, full fall precautions. °Diet.  Soft with feeding assistance and aspiration precautions. °Goal of care.  Comfort. ° °

## 2019-03-19 NOTE — Progress Notes (Signed)
Pts point of contact called and given update and notified pt would be leaving soon

## 2019-03-19 NOTE — Progress Notes (Signed)
[facility] called [phone]  They had no list but patient list to verify pickup for tonight.  They sill may come since trucks have been running behind; called and let family know.

## 2019-03-19 NOTE — Care Management Important Message (Signed)
Important Message  Patient Details  Name: Yvonne Edwards MRN: [id number] Date of Birth: 07/29/1918   Medicare Important Message Given:  Yes - Important Message mailed due to current National Emergency  Verbal consent obtained due to current National Emergency  Relationship to patient: Other Realative (must comment) Contact Name: Vita Barley Call Date: 03/19/19  Time: 1222 Phone: [phone] Outcome: Spoke with contact Important Message mailed to: Other (must enter comment)(emailed to [email or web address])    Leena Tiede P Qunicy Higinbotham 03/19/2019, 12:22 PM

## 2019-03-19 NOTE — Discharge Summary (Signed)
Yvonne Edwards YNW:[id number] DOB: 11/13/1918 DOA: 03/08/2019  PCP: Yvonne Reichmann, MD  Admit date: 03/08/2019  Discharge date: 03/19/2019  Admitted From: SNF   Disposition:  Hospice   Recommendations for Outpatient Follow-up:   Follow up with PCP in 1-2 weeks  PCP Please obtain BMP/CBC, 2 view CXR in 1week,  (see Discharge instructions)   PCP Please follow up on the following pending results:    Home Health: None   Equipment/Devices: None  Consultations: None  Discharge Condition: Guarded CODE STATUS: DNR Diet Recommendation: Soft diet with feeding assistance and aspiration precautions for comfort measures only.  CC - Fever   Brief history of present illness from the day of admission and additional interim summary     85 y.o.femalewith medical history significant ofbilateral hearing loss,age-related macular degeneration, hyperlipidemia, chronic diarrheawho was sent to ED byher assisted living facility for concerns of fever, weakness and increased confusion, her work-up was significant for UTI, as well she did test positive for COVID-19 pneumonia, so she was transferred to [facility] for further management.                                                                 Hospital Course    COVID-19 virus infection - actually mild COVID-19 disease did not require any treatment, does not have any demonstrable shortness of breath.  Negative chest x-ray.  Is on 1 to 2 L of oxygen for comfort purposes only.  Her main issue was her extremely frail status and advanced age and due to mild infection she had decline in her mental status most likely toxic encephalopathy due to which she stopped eating or drinking completely, she is very frail to begin with.  Palliative care was involved and decision was made to  transition her to full comfort measures.  All treatment now directed towards comfort measures due to her baseline frailty and refusal to eat or drink.  She will be discharged to a hospice facility for continued full comfort care.  She is not taking any oral medications or oral diet.  She spits out pretty much everything.  Overall appears stable.  All medications have been stopped.  Comfort medications can be given at hospice facility as needed.   Other medical issues addressed this admission earlier during her hospital course were as below.   Altered mental status: - acute metabolic encephalopathy in the setting of infectious process due to UTI and COVID-19 infection. -Likely due to UTI, CT head with no acute findings. -Status appears to be improving, but slowly, her mental status is difficult to assess, as she is extremely hard of hearing, and speak with very soft sound.  Hypophosphatemia -Repleted.  UTI -Culture growing Klebsiella, treated with IV Rocephin  Right 5 mm apical pneumothorax: - Incidental finding on CT  spine. Patient is not hypoxic and not tachypneic on exam. The repeated CXR is negative forpneumothorax  Hypokalemia: -Repleted  Chronic diarrheaand mild AP: - pt is on immodium for this. CT ofabdomen is unremarkable. -Hold Imodium until C diff negative -Hold laxatives  Hyperlipidemia -Discontinue rosuvastatin as she is comfort care  Dementia -Continue mirtazapine  Protein calorie malnutrition:Moderate./Failure to thrive -consulteddietitian.  her oral intake remains very poor, would not eat any of her food, she barely tolerated few sips of supplements.  Pushes food and nutrition out, palliative care consulted, they discussed with Yvonne Edwards, and is extremely frail, with no oral intake, aggressive measures, current goals are comfort, social worker consulted to see if they can arrange for [facility] .    Discharge diagnosis     Principal  Problem:   Acute lower UTI Active Problems:   AMD (age related macular degeneration)   Acute metabolic encephalopathy   Other pneumothorax   COVID-19 virus infection   Chronic diarrhea   UTI (urinary tract infection)   Dementia [facility])    Discharge instructions    Discharge Instructions    Discharge instructions   Complete by: As directed    Disposition.  Residential hospice Condition.  Guarded CODE STATUS.  DNR Activity.  With assistance as tolerated, full fall precautions. Diet.  Soft with feeding assistance and aspiration precautions. Goal of care.  Comfort.      Discharge Medications   Allergies as of 03/19/2019      Reactions   Biaxin [clarithromycin]    Codeine Other (See Comments)   Shellfish Allergy Nausea And Vomiting      Medication List    STOP taking these medications   acetaminophen 325 MG tablet Commonly known as: TYLENOL   calcium-vitamin D 500-200 MG-UNIT tablet Commonly known as: Oscal 500/200 D-3   cefTRIAXone 1 g in sodium chloride 0.9 % 100 mL   Cholecalciferol 100 MCG (4000 UT) Tabs   enoxaparin 30 MG/0.3ML injection Commonly known as: LOVENOX   ENSURE ENLIVE PO   methocarbamol 500 MG tablet Commonly known as: ROBAXIN   mirtazapine 7.5 MG tablet Commonly known as: REMERON   PRESERVISION AREDS 2 PO   rosuvastatin 10 MG tablet Commonly known as: CRESTOR   vitamin C 500 MG tablet Commonly known as: ASCORBIC ACID   Zinc Sulfate 220 (50 Zn) MG Tabs     TAKE these medications   albuterol 108 (90 Base) MCG/ACT inhaler Commonly known as: VENTOLIN HFA Inhale 2 puffs into the lungs every 6 (six) hours as needed for wheezing or shortness of breath.         Major procedures and Radiology Reports - PLEASE review detailed and final reports thoroughly  -        Ct Head Wo Contrast  Result Date: 03/07/2019 CLINICAL DATA:  85 year old female with altered mental status. EXAM: CT HEAD WITHOUT CONTRAST CT CERVICAL SPINE WITHOUT  CONTRAST TECHNIQUE: Multidetector CT imaging of the head and cervical spine was performed following the standard protocol without intravenous contrast. Multiplanar CT image reconstructions of the cervical spine were also generated. COMPARISON:  None. FINDINGS: CT HEAD FINDINGS Brain: Moderate to advanced age-related atrophy and chronic microvascular ischemic changes. There is no acute intracranial hemorrhage. No mass effect or midline shift. No extra-axial fluid collection. Vascular: No hyperdense vessel or unexpected calcification. Skull: Normal. Negative for fracture or focal lesion. Sinuses/Orbits: No acute finding. Other: None CT CERVICAL SPINE FINDINGS Alignment: No acute subluxation. Grade 1 C3-C4 anterolisthesis. Skull base and vertebrae:  No acute fracture. Osteopenia. Soft tissues and spinal canal: No prevertebral fluid or swelling. No visible canal hematoma. Disc levels: Multilevel degenerative changes with endplate irregularity and disc space narrowing. Upper chest: Biapical subpleural scarring. There is a 5 mm right apical pneumothorax. This is new compared to the CT of 12/09/2016. Other: Bilateral carotid bulb calcified plaques. IMPRESSION: 1. No acute intracranial hemorrhage. Moderate to advanced age-related atrophy and chronic microvascular ischemic changes. 2. No acute/traumatic cervical spine pathology. Multilevel degenerative changes. 3. A 5 mm right apical pneumothorax. These results were called by telephone at the time of interpretation on 03/07/2019 at 4:29 pm to provider [facility] , who verbally acknowledged these results. Electronically Signed   By: Elgie Collard M.D.   On: 03/07/2019 16:30   Ct Cervical Spine Wo Contrast  Result Date: 03/07/2019 CLINICAL DATA:  85 year old female with altered mental status. EXAM: CT HEAD WITHOUT CONTRAST CT CERVICAL SPINE WITHOUT CONTRAST TECHNIQUE: Multidetector CT imaging of the head and cervical spine was performed following the standard protocol  without intravenous contrast. Multiplanar CT image reconstructions of the cervical spine were also generated. COMPARISON:  None. FINDINGS: CT HEAD FINDINGS Brain: Moderate to advanced age-related atrophy and chronic microvascular ischemic changes. There is no acute intracranial hemorrhage. No mass effect or midline shift. No extra-axial fluid collection. Vascular: No hyperdense vessel or unexpected calcification. Skull: Normal. Negative for fracture or focal lesion. Sinuses/Orbits: No acute finding. Other: None CT CERVICAL SPINE FINDINGS Alignment: No acute subluxation. Grade 1 C3-C4 anterolisthesis. Skull base and vertebrae: No acute fracture. Osteopenia. Soft tissues and spinal canal: No prevertebral fluid or swelling. No visible canal hematoma. Disc levels: Multilevel degenerative changes with endplate irregularity and disc space narrowing. Upper chest: Biapical subpleural scarring. There is a 5 mm right apical pneumothorax. This is new compared to the CT of 12/09/2016. Other: Bilateral carotid bulb calcified plaques. IMPRESSION: 1. No acute intracranial hemorrhage. Moderate to advanced age-related atrophy and chronic microvascular ischemic changes. 2. No acute/traumatic cervical spine pathology. Multilevel degenerative changes. 3. A 5 mm right apical pneumothorax. These results were called by telephone at the time of interpretation on 03/07/2019 at 4:29 pm to provider [facility] , who verbally acknowledged these results. Electronically Signed   By: Elgie Collard M.D.   On: 03/07/2019 16:30   Ct Abdomen Pelvis W Contrast  Result Date: 03/05/2019 CLINICAL DATA:  Upper abdominal pain, diarrhea, decreased appetite EXAM: CT ABDOMEN AND PELVIS WITH CONTRAST TECHNIQUE: Multidetector CT imaging of the abdomen and pelvis was performed using the standard protocol following bolus administration of intravenous contrast. CONTRAST:  85mL OMNIPAQUE IOHEXOL 300 MG/ML  SOLN COMPARISON:  12/20/2012 FINDINGS: Technical  note: Examination is degraded by patient motion artifact. Additional metallic streak/beam hardening artifact from right total hip arthroplasty hardware degrades evaluation of the deep pelvis. Lower chest: Trace right pleural effusion with right basilar atelectasis and small dependent areas of airspace consolidation. Left lung base clear. Coronary artery calcifications. Hepatobiliary: No focal liver abnormality is seen. Status post cholecystectomy. No biliary dilatation. Pancreas: Grossly unremarkable. Spleen: Normal in size without focal abnormality. Adrenals/Urinary Tract: Adrenal glands and bilateral kidneys appear grossly unremarkable. Urinary bladder is partially obscured by artifact. No discrete abnormality. Stomach/Bowel: Negative for bowel obstruction. Small volume liquid stool within bowel suggesting diarrheal state. No definite bowel thickening or inflammatory changes are evident within the limitations of this exam. Vascular/Lymphatic: Aortic atherosclerosis. No enlarged abdominal or pelvic lymph nodes. Reproductive: No appreciable abnormality. No adnexal mass. Other: No ascites. No abdominal wall hernia.  Musculoskeletal: Partially visualized right total hip arthroplasty hardware. Degenerative disc disease of the lumbar spine. There is a dextroscoliotic curvature of the lumbar spine. No acute osseous findings are evident. IMPRESSION: 1. Somewhat limited exam.  No acute abdominopelvic findings. 2. Trace right pleural effusion with right basilar atelectasis and small dependent areas of airspace consolidation. 3. Status post cholecystectomy. 4. Aortic atherosclerosis and coronary artery calcifications. Electronically Signed   By: Duanne GuessNicholas  Plundo M.D.   On: 03/05/2019 17:17   Dg Chest Port 1 View  Result Date: 03/08/2019 CLINICAL DATA:  COVID positive, evaluate for pneumothorax EXAM: PORTABLE CHEST 1 VIEW COMPARISON:  2018 FINDINGS: Chronic mild interstitial prominence. No pleural effusion or  pneumothorax. Stable cardiomediastinal contours with normal heart size. Calcified plaque along the thoracic aorta. IMPRESSION: No pneumothorax. Electronically Signed   By: Guadlupe SpanishPraneil  Patel M.D.   On: 03/08/2019 08:35   Dg Chest Portable 1 View  Result Date: 03/07/2019 CLINICAL DATA:  Fever and weakness. EXAM: PORTABLE CHEST 1 VIEW COMPARISON:  Chest x-ray dated December 09, 2016. FINDINGS: The heart size and mediastinal contours are within normal limits. Atherosclerotic calcification of the thoracic aorta. Normal pulmonary vascularity. No focal consolidation, pleural effusion, or pneumothorax. No acute osseous abnormality. IMPRESSION: No active disease. Electronically Signed   By: Obie DredgeWilliam T Derry M.D.   On: 03/07/2019 15:59    Micro Results     No results found for this or any previous visit (from the past 240 hour(s)).  Today   Subjective    Yvonne MerinoHelen Delahoz today remains in hospital bed in no discomfort, will open her eyes to verbal stimuli but unable to answer questions or follow commands reliably.   Objective   Blood pressure 135/69, pulse 90, temperature 98.1 F (36.7 C), temperature source Axillary, resp. rate 20, height 4' 9.99" (1.473 m), weight 53.7 kg, SpO2 98 %.   Intake/Output Summary (Last 24 hours) at 03/19/2019 0919 Last data filed at 03/19/2019 0600 Gross per 24 hour  Intake 160 ml  Output 250 ml  Net -90 ml    Exam  Frail elderly white female lying in hospital bed in no discomfort, unable to answer questions or follow commands reliably, [place].AT,PERRAL Supple Neck,No JVD, No cervical lymphadenopathy appriciated.  Symmetrical Chest wall movement, Good air movement bilaterally, CTAB RRR,No Gallops,Rubs or new Murmurs, No Parasternal Heave +ve B.Sounds, Abd Soft, Non tender, No organomegaly appriciated, No rebound -guarding or rigidity. No Cyanosis, Clubbing or edema, No new Rash or bruise   Data Review   CBC w Diff:  Lab Results  Component Value Date   WBC 5.5  03/13/2019   HGB 13.4 03/13/2019   HGB 10.2 (L) 12/27/2012   HCT 41.9 03/13/2019   HCT 29.7 (L) 12/27/2012   PLT 245 03/13/2019   PLT 227 12/27/2012   LYMPHOPCT 22 03/13/2019   LYMPHOPCT 9.6 12/27/2012   MONOPCT 16 03/13/2019   MONOPCT 13.4 12/27/2012   EOSPCT 3 03/13/2019   EOSPCT 4.5 12/27/2012   BASOPCT 1 03/13/2019   BASOPCT 0.9 12/27/2012    CMP:  Lab Results  Component Value Date   NA 139 03/13/2019   NA 137 12/27/2012   K 4.8 03/13/2019   K 4.5 12/27/2012   CL 99 03/13/2019   CL 100 12/27/2012   CO2 30 03/13/2019   CO2 29 12/27/2012   BUN 15 03/13/2019   BUN 4 (L) 12/27/2012   CREATININE 0.74 03/13/2019   CREATININE 0.67 12/27/2012   PROT 5.6 (L) 03/13/2019   PROT 5.0 (L)  12/27/2012   ALBUMIN 2.5 (L) 03/13/2019   ALBUMIN 1.8 (L) 12/27/2012   BILITOT 0.5 03/13/2019   BILITOT 0.6 12/27/2012   ALKPHOS 77 03/13/2019   ALKPHOS 69 12/27/2012   AST 55 (H) 03/13/2019   AST 34 12/27/2012   ALT 32 03/13/2019   ALT 16 12/27/2012  .   Total Time in preparing paper work, data evaluation and todays exam - 10 minutes  Lala Lund M.D on 03/19/2019 at 9:19 AM  [facility]   Office  [phone]

## 2019-03-19 NOTE — Progress Notes (Signed)
[facility]  Pt approved for hospice services at [facility] per Dr. Gilford Rile of [facility].  DME to be delivered to [facility] between 1-2 pm.  Thank you, Venia Carbon RN, BSN, [place] (in [place]) [phone]

## 2019-03-19 NOTE — Progress Notes (Signed)
Report given to Lattie Haw, Therapist, sports at [facility]. [facility] has been called for Transport.

## 2019-03-19 NOTE — TOC Progression Note (Signed)
Transition of Care [facility]) - Progression Note    Patient Details  Name: Yvonne Edwards MRN: [id number] Date of Birth: Aug 20, 1918  Transition of Care [facility]) CM/SW Contact  Joaquin Courts, RN Phone Number: 03/19/2019, 4:18 PM  Clinical Narrative:    CM confirmed availability for [facility] to accept patient today.   FL2 and discharge medications faxed to facility. Confirmed hospital bed has been delivered.  Bedside RN please call report to [phone]. Please place facesheet, medical necessity form, yellow DNR form, and any prescriptions into discharge packet for transport.     Expected Discharge Plan: Assisted Living Barriers to Discharge: No Barriers Identified  Expected Discharge Plan and Services Expected Discharge Plan: Assisted Living In-house Referral: Clinical Social Work, Hospice / [facility] Choice: [place] arrangements for the past 2 months: Assisted Living Facility([facility]) Expected Discharge Date: 03/19/19                                     Social Determinants of Health (SDOH) Interventions    Readmission Risk Interventions No flowsheet data found.

## 2019-04-19 DEATH — deceased
